# Patient Record
Sex: Male | Born: 1955 | Race: White | Hispanic: Yes | Smoking: Former smoker
Health system: Southern US, Community
[De-identification: ages and names within clinical notes are randomized; demographics above are authoritative.]

## PROBLEM LIST (undated history)

## (undated) DIAGNOSIS — M1990 Unspecified osteoarthritis, unspecified site: Secondary | ICD-10-CM

## (undated) DIAGNOSIS — H9319 Tinnitus, unspecified ear: Secondary | ICD-10-CM

## (undated) DIAGNOSIS — J439 Emphysema, unspecified: Secondary | ICD-10-CM

## (undated) DIAGNOSIS — I10 Essential (primary) hypertension: Secondary | ICD-10-CM

## (undated) DIAGNOSIS — E785 Hyperlipidemia, unspecified: Secondary | ICD-10-CM

## (undated) DIAGNOSIS — G629 Polyneuropathy, unspecified: Secondary | ICD-10-CM

## (undated) DIAGNOSIS — I209 Angina pectoris, unspecified: Secondary | ICD-10-CM

## (undated) DIAGNOSIS — K219 Gastro-esophageal reflux disease without esophagitis: Secondary | ICD-10-CM

## (undated) HISTORY — DX: Polyneuropathy, unspecified: G62.9

## (undated) HISTORY — PX: TONSILLECTOMY: SUR1361

## (undated) HISTORY — PX: VASECTOMY: SHX75

## (undated) HISTORY — DX: Tinnitus, unspecified ear: H93.19

## (undated) HISTORY — DX: Gastro-esophageal reflux disease without esophagitis: K21.9

## (undated) HISTORY — DX: Essential (primary) hypertension: I10

## (undated) HISTORY — DX: Emphysema, unspecified: J43.9

## (undated) HISTORY — DX: Hyperlipidemia, unspecified: E78.5

---

## 1989-08-02 HISTORY — PX: ELBOW SURGERY: SHX618

## 2009-08-02 HISTORY — PX: FEMORAL-POPLITEAL BYPASS GRAFT: SHX937

## 2012-10-05 DIAGNOSIS — I10 Essential (primary) hypertension: Secondary | ICD-10-CM | POA: Insufficient documentation

## 2012-10-05 DIAGNOSIS — I739 Peripheral vascular disease, unspecified: Secondary | ICD-10-CM | POA: Insufficient documentation

## 2012-10-05 DIAGNOSIS — E785 Hyperlipidemia, unspecified: Secondary | ICD-10-CM | POA: Insufficient documentation

## 2017-03-31 ENCOUNTER — Encounter: Payer: Self-pay | Admitting: Physician Assistant

## 2017-03-31 ENCOUNTER — Ambulatory Visit: Payer: Self-pay | Admitting: Physician Assistant

## 2017-03-31 VITALS — BP 184/115 | HR 91 | Temp 97.9°F | Ht 71.0 in | Wt 186.0 lb

## 2017-03-31 DIAGNOSIS — Z13220 Encounter for screening for lipoid disorders: Secondary | ICD-10-CM

## 2017-03-31 DIAGNOSIS — I10 Essential (primary) hypertension: Secondary | ICD-10-CM

## 2017-03-31 DIAGNOSIS — J449 Chronic obstructive pulmonary disease, unspecified: Secondary | ICD-10-CM

## 2017-03-31 DIAGNOSIS — Z131 Encounter for screening for diabetes mellitus: Secondary | ICD-10-CM

## 2017-03-31 DIAGNOSIS — F17219 Nicotine dependence, cigarettes, with unspecified nicotine-induced disorders: Secondary | ICD-10-CM

## 2017-03-31 DIAGNOSIS — Z125 Encounter for screening for malignant neoplasm of prostate: Secondary | ICD-10-CM

## 2017-03-31 MED ORDER — CLONIDINE HCL 0.1 MG PO TABS
0.1000 mg | ORAL_TABLET | Freq: Once | ORAL | Status: AC
  Administered 2017-03-31: 0.1 mg via ORAL

## 2017-03-31 MED ORDER — LISINOPRIL 20 MG PO TABS: 20.0000 mg | ORAL_TABLET | Freq: Every day | ORAL | 1 refills | Status: DC

## 2017-03-31 MED ORDER — METOPROLOL TARTRATE 50 MG PO TABS: 50.0000 mg | ORAL_TABLET | Freq: Two times a day (BID) | ORAL | 1 refills | Status: DC

## 2017-03-31 NOTE — Progress Notes (Signed)
BP (!) 208/113 (BP Location: Right Arm, Patient Position: Sitting, Cuff Size: Normal)   Pulse 91   Temp 97.9 F (36.6 C) (Other (Comment))   Ht 5\' 11"  (1.803 m)   Wt 186 lb (84.4 kg)   SpO2 97%   BMI 25.94 kg/m    Subjective:    Patient ID: Daniel Chung Pain, male    DOB: May 01, 1956, 61 y.o.   MRN: [id number]  HPI: Verl Pain is a 61 y.o. male presenting on 03/31/2017 for New Patient (Initial Visit) ("has felt fatigued and had some dizziness in the last few days")   HPI   Chief Complaint  Patient presents with  . New Patient (Initial Visit)    "has felt fatigued and had some dizziness in the last few days"     Pt was on bp med in the past but hasn't been for past 3 1/2- 4 years.  Pt checks his bp at home- running 626-948 systolically.   Pt is feeling well.  He says he has No HA, no vision changes, no CP.   Relevant past medical, surgical, family and social history reviewed and updated as indicated. Interim medical history since our last visit reviewed. Allergies and medications reviewed and updated.  No current outpatient prescriptions on file.   Review of Systems  Constitutional: Positive for diaphoresis and fatigue. Negative for appetite change, chills, fever and unexpected weight change.  HENT: Positive for congestion. Negative for dental problem, drooling, ear pain, facial swelling, hearing loss, mouth sores, sneezing, sore throat, trouble swallowing and voice change.   Eyes: Negative for pain, discharge, redness, itching and visual disturbance.  Respiratory: Positive for cough and shortness of breath. Negative for choking and wheezing.   Cardiovascular: Negative for chest pain, palpitations and leg swelling.  Gastrointestinal: Negative for abdominal pain, blood in stool, constipation, diarrhea and vomiting.  Endocrine: Negative for cold intolerance, heat intolerance and polydipsia.  Genitourinary: Negative for decreased urine volume, dysuria and hematuria.   Musculoskeletal: Negative for arthralgias, back pain and gait problem.  Skin: Negative for rash.  Allergic/Immunologic: Negative for environmental allergies.  Neurological: Positive for light-headedness. Negative for seizures, syncope and headaches.  Hematological: Negative for adenopathy.  Psychiatric/Behavioral: Negative for agitation, dysphoric mood and suicidal ideas. The patient is nervous/anxious.     Per HPI unless specifically indicated above     Objective:    BP (!) 208/113 (BP Location: Right Arm, Patient Position: Sitting, Cuff Size: Normal)   Pulse 91   Temp 97.9 F (36.6 C) (Other (Comment))   Ht 5\' 11"  (1.803 m)   Wt 186 lb (84.4 kg)   SpO2 97%   BMI 25.94 kg/m   Wt Readings from Last 3 Encounters:  03/31/17 186 lb (84.4 kg)    Physical Exam  Constitutional: He is oriented to person, place, and time. He appears well-developed and well-nourished.  HENT:  Head: Normocephalic and atraumatic.  Mouth/Throat: Oropharynx is clear and moist. No oropharyngeal exudate.  Eyes: Pupils are equal, round, and reactive to light. Conjunctivae and EOM are normal.  Neck: Neck supple. No thyromegaly present.  Cardiovascular: Normal rate and regular rhythm.   Pulmonary/Chest: Effort normal and breath sounds normal. He has no wheezes. He has no rales.  Abdominal: Soft. Bowel sounds are normal. He exhibits no mass. There is no hepatosplenomegaly. There is no tenderness.  Musculoskeletal: He exhibits no edema.  Lymphadenopathy:    He has no cervical adenopathy.  Neurological: He is alert and oriented to person, place, and time.  Skin: Skin is warm and dry. No rash noted.  Psychiatric: He has a normal mood and affect. His behavior is normal. Thought content normal.  Vitals reviewed.      No results found for this or any previous visit.    Assessment & Plan:   Encounter Diagnoses  Name Primary?  . Essential hypertension Yes  . Cigarette nicotine dependence with  nicotine-induced disorder   . Chronic obstructive pulmonary disease, unspecified COPD type ([place])   . Screening cholesterol level   . Screening for diabetes mellitus   . Screening for prostate cancer      -Discussed smoking cessation. chantix worked for him in the past and he tolerated it well.   -will check baseline labs --will get pt signed up fo [facility] -rx albuterol mdi and chantix from [facility] -rx lisinopril and metoprolol for bp. -pt to follow up in 3 weeks.  He is to RTO sooner prn.  Discussed with pt that he should go to ER for HA, CP with eleated bp

## 2017-04-01 ENCOUNTER — Other Ambulatory Visit (HOSPITAL_COMMUNITY)
Admission: RE | Admit: 2017-04-01 | Discharge: 2017-04-01 | Disposition: A | Discharge status: Home / Self Care | Payer: Self-pay | Source: Ambulatory Visit | Attending: Physician Assistant | Admitting: Physician Assistant

## 2017-04-01 DIAGNOSIS — I10 Essential (primary) hypertension: Secondary | ICD-10-CM | POA: Insufficient documentation

## 2017-04-01 DIAGNOSIS — Z13220 Encounter for screening for lipoid disorders: Secondary | ICD-10-CM | POA: Insufficient documentation

## 2017-04-01 DIAGNOSIS — Z131 Encounter for screening for diabetes mellitus: Secondary | ICD-10-CM | POA: Insufficient documentation

## 2017-04-01 DIAGNOSIS — J449 Chronic obstructive pulmonary disease, unspecified: Secondary | ICD-10-CM | POA: Insufficient documentation

## 2017-04-01 DIAGNOSIS — Z125 Encounter for screening for malignant neoplasm of prostate: Secondary | ICD-10-CM | POA: Insufficient documentation

## 2017-04-01 LAB — CBC WITH DIFFERENTIAL/PLATELET
Basophils Absolute: 0 10*3/uL (ref 0.0–0.1)
Basophils Relative: 0 %
EOS ABS: 0.3 10*3/uL (ref 0.0–0.7)
EOS PCT: 2 %
HCT: 49.1 % (ref 39.0–52.0)
Hemoglobin: 17 g/dL (ref 13.0–17.0)
LYMPHS ABS: 3.3 10*3/uL (ref 0.7–4.0)
Lymphocytes Relative: 30 %
MCH: 31.8 pg (ref 26.0–34.0)
MCHC: 34.6 g/dL (ref 30.0–36.0)
MCV: 91.9 fL (ref 78.0–100.0)
Monocytes Absolute: 0.8 10*3/uL (ref 0.1–1.0)
Monocytes Relative: 7 %
Neutro Abs: 6.8 10*3/uL (ref 1.7–7.7)
Neutrophils Relative %: 61 %
PLATELETS: 201 10*3/uL (ref 150–400)
RBC: 5.34 MIL/uL (ref 4.22–5.81)
RDW: 13.7 % (ref 11.5–15.5)
WBC: 11.2 10*3/uL — AB (ref 4.0–10.5)

## 2017-04-01 LAB — HEMOGLOBIN A1C
Hgb A1c MFr Bld: 6 % — ABNORMAL HIGH (ref 4.8–5.6)
Mean Plasma Glucose: 125.5 mg/dL

## 2017-04-01 LAB — LIPID PANEL
CHOLESTEROL: 263 mg/dL — AB (ref 0–200)
HDL: 31 mg/dL — ABNORMAL LOW (ref >40)
LDL Cholesterol: 178 mg/dL — ABNORMAL HIGH (ref 0–99)
TRIGLYCERIDES: 268 mg/dL — AB (ref <150)
Total CHOL/HDL Ratio: 8.5 RATIO
VLDL: 54 mg/dL — AB (ref 0–40)

## 2017-04-01 LAB — PSA: Prostatic Specific Antigen: 1.53 ng/mL (ref 0.00–4.00)

## 2017-04-01 LAB — COMPREHENSIVE METABOLIC PANEL
ALT: 31 U/L (ref 17–63)
ANION GAP: 6 (ref 5–15)
AST: 24 U/L (ref 15–41)
Albumin: 4.2 g/dL (ref 3.5–5.0)
Alkaline Phosphatase: 87 U/L (ref 38–126)
BUN: 16 mg/dL (ref 6–20)
CHLORIDE: 102 mmol/L (ref 101–111)
CO2: 28 mmol/L (ref 22–32)
CREATININE: 1.03 mg/dL (ref 0.61–1.24)
Calcium: 9.8 mg/dL (ref 8.9–10.3)
Glucose, Bld: 119 mg/dL — ABNORMAL HIGH (ref 65–99)
Potassium: 4.6 mmol/L (ref 3.5–5.1)
Sodium: 136 mmol/L (ref 135–145)
Total Bilirubin: 0.5 mg/dL (ref 0.3–1.2)
Total Protein: 7.4 g/dL (ref 6.5–8.1)

## 2017-04-19 ENCOUNTER — Ambulatory Visit: Payer: Self-pay | Admitting: Physician Assistant

## 2017-04-19 ENCOUNTER — Encounter: Payer: Self-pay | Admitting: Physician Assistant

## 2017-04-19 VITALS — BP 175/104 | HR 69 | Temp 97.7°F | Ht 71.0 in | Wt 184.0 lb

## 2017-04-19 DIAGNOSIS — E785 Hyperlipidemia, unspecified: Secondary | ICD-10-CM

## 2017-04-19 DIAGNOSIS — F17219 Nicotine dependence, cigarettes, with unspecified nicotine-induced disorders: Secondary | ICD-10-CM

## 2017-04-19 DIAGNOSIS — I10 Essential (primary) hypertension: Secondary | ICD-10-CM

## 2017-04-19 DIAGNOSIS — J449 Chronic obstructive pulmonary disease, unspecified: Secondary | ICD-10-CM

## 2017-04-19 MED ORDER — METOPROLOL TARTRATE 100 MG PO TABS: 100.0000 mg | ORAL_TABLET | Freq: Two times a day (BID) | ORAL | 1 refills | Status: DC

## 2017-04-19 MED ORDER — ALBUTEROL SULFATE HFA 108 (90 BASE) MCG/ACT IN AERS: 2.0000 | INHALATION_SPRAY | Freq: Four times a day (QID) | RESPIRATORY_TRACT | 2 refills | Status: DC | PRN

## 2017-04-19 MED ORDER — ATORVASTATIN CALCIUM 20 MG PO TABS: 20.0000 mg | ORAL_TABLET | Freq: Every day | ORAL | 2 refills | Status: DC

## 2017-04-19 NOTE — Patient Instructions (Signed)
Fat and Cholesterol Restricted Diet High levels of fat and cholesterol in your blood may lead to various health problems, such as diseases of the heart, blood vessels, gallbladder, liver, and pancreas. Fats are concentrated sources of energy that come in various forms. Certain types of fat, including saturated fat, may be harmful in excess. Cholesterol is a substance needed by your body in small amounts. Your body makes all the cholesterol it needs. Excess cholesterol comes from the food you eat. When you have high levels of cholesterol and saturated fat in your blood, health problems can develop because the excess fat and cholesterol will gather along the walls of your blood vessels, causing them to narrow. Choosing the right foods will help you control your intake of fat and cholesterol. This will help keep the levels of these substances in your blood within normal limits and reduce your risk of disease. What is my plan? Your health care provider recommends that you:  Limit your fat intake to ______% or less of your total calories per day.  Limit the amount of cholesterol in your diet to less than _________mg per day.  Eat 20-30 grams of fiber each day.  What types of fat should I choose?  Choose healthy fats more often. Choose monounsaturated and polyunsaturated fats, such as olive and canola oil, flaxseeds, walnuts, almonds, and seeds.  Eat more omega-3 fats. Good choices include salmon, mackerel, sardines, tuna, flaxseed oil, and ground flaxseeds. Aim to eat fish at least two times a week.  Limit saturated fats. Saturated fats are primarily found in animal products, such as meats, butter, and cream. Plant sources of saturated fats include palm oil, palm kernel oil, and coconut oil.  Avoid foods with partially hydrogenated oils in them. These contain trans fats. Examples of foods that contain trans fats are stick margarine, some tub margarines, cookies, crackers, and other baked goods. What  general guidelines do I need to follow? These guidelines for healthy eating will help you control your intake of fat and cholesterol:  Check food labels carefully to identify foods with trans fats or high amounts of saturated fat.  Fill one half of your plate with vegetables and green salads.  Fill one fourth of your plate with whole grains. Look for the word "whole" as the first word in the ingredient list.  Fill one fourth of your plate with lean protein foods.  Limit fruit to two servings a day. Choose fruit instead of juice.  Eat more foods that contain fiber, such as apples, broccoli, carrots, beans, peas, and barley.  Eat more home-cooked food and less restaurant, buffet, and fast food.  Limit or avoid alcohol.  Limit foods high in starch and sugar.  Limit fried foods.  Cook foods using methods other than frying. Baking, boiling, grilling, and broiling are all great options.  Lose weight if you are overweight. Losing just 5-10% of your initial body weight can help your overall health and prevent diseases such as diabetes and heart disease.  What foods can I eat? Grains  Whole grains, such as whole wheat or whole grain breads, crackers, cereals, and pasta. Unsweetened oatmeal, bulgur, barley, quinoa, or brown rice. Corn or whole wheat flour tortillas. Vegetables  Fresh or frozen vegetables (raw, steamed, roasted, or grilled). Green salads. Fruits  All fresh, canned (in natural juice), or frozen fruits. Meats and other protein foods  Ground beef (85% or leaner), grass-fed beef, or beef trimmed of fat. Skinless chicken or turkey. Ground chicken or turkey.   Pork trimmed of fat. All fish and seafood. Eggs. Dried beans, peas, or lentils. Unsalted nuts or seeds. Unsalted canned or dry beans. Dairy  Low-fat dairy products, such as skim or 1% milk, 2% or reduced-fat cheeses, low-fat ricotta or cottage cheese, or plain low-fat yo Fats and oils  Tub margarines without trans  fats. Light or reduced-fat mayonnaise and salad dressings. Avocado. Olive, canola, sesame, or safflower oils. Natural peanut or almond butter (choose ones without added sugar and oil). The items listed above may not be a complete list of recommended foods or beverages. Contact your dietitian for more options. Foods to avoid Grains  White bread. White pasta. White rice. Cornbread. Bagels, pastries, and croissants. Crackers that contain trans fat. Vegetables  White potatoes. Corn. Creamed or fried vegetables. Vegetables in a cheese sauce. Fruits  Dried fruits. Canned fruit in light or heavy syrup. Fruit juice. Meats and other protein foods  Fatty cuts of meat. Ribs, chicken wings, bacon, sausage, bologna, salami, chitterlings, fatback, hot dogs, bratwurst, and packaged luncheon meats. Liver and organ meats. Dairy  Whole or 2% milk, cream, half-and-half, and cream cheese. Whole milk cheeses. Whole-fat or sweetened yogurt. Full-fat cheeses. Nondairy creamers and whipped toppings. Processed cheese, cheese spreads, or cheese curds. Beverages  Alcohol. Sweetened drinks (such as sodas, lemonade, and fruit drinks or punches). Fats and oils  Butter, stick margarine, lard, shortening, ghee, or bacon fat. Coconut, palm kernel, or palm oils. Sweets and desserts  Corn syrup, sugars, honey, and molasses. Candy. Jam and jelly. Syrup. Sweetened cereals. Cookies, pies, cakes, donuts, muffins, and ice cream. The items listed above may not be a complete list of foods and beverages to avoid. Contact your dietitian for more information. This information is not intended to replace advice given to you by your health care provider. Make sure you discuss any questions you have with your health care provider. Document Released: 07/19/2005 Document Revised: 08/09/2014 Document Reviewed: 10/17/2013 Elsevier Interactive Patient Education  2017 [employer]  

## 2017-04-19 NOTE — Progress Notes (Signed)
BP (!) 175/104 (BP Location: Right Arm, Patient Position: Sitting, Cuff Size: Normal)   Pulse 69   Temp 97.7 F (36.5 C) (Other (Comment))   Ht 5\' 11"  (1.803 m)   Wt 184 lb (83.5 kg)   SpO2 98%   BMI 25.66 kg/m    Subjective:    Patient ID: Daniel Chung, male    DOB: 08-24-1955, 61 y.o.   MRN: [id number]  HPI: Daniel Chung is a 61 y.o. male presenting on 04/19/2017 for Hypertension and Follow-up   HPI   Pt is feeling well today  Relevant past medical, surgical, family and social history reviewed and updated as indicated. Interim medical history since our last visit reviewed. Allergies and medications reviewed and updated.  CURRENT MEDS: Lisinopril 20mg  qd Metoprolol 50mg  bid  Review of Systems  Constitutional: Negative for appetite change, chills, diaphoresis, fatigue, fever and unexpected weight change.  HENT: Negative for congestion, dental problem, drooling, ear pain, facial swelling, hearing loss, mouth sores, sneezing, sore throat, trouble swallowing and voice change.   Eyes: Negative for pain, discharge, redness, itching and visual disturbance.  Respiratory: Negative for cough, choking, shortness of breath and wheezing.   Cardiovascular: Negative for chest pain, palpitations and leg swelling.  Gastrointestinal: Negative for abdominal pain, blood in stool, constipation, diarrhea and vomiting.  Endocrine: Negative for cold intolerance, heat intolerance and polydipsia.  Genitourinary: Negative for decreased urine volume, dysuria and hematuria.  Musculoskeletal: Negative for arthralgias, back pain and gait problem.  Skin: Negative for rash.  Allergic/Immunologic: Negative for environmental allergies.  Neurological: Negative for seizures, syncope, light-headedness and headaches.  Hematological: Negative for adenopathy.  Psychiatric/Behavioral: Negative for agitation, dysphoric mood and suicidal ideas. The patient is not nervous/anxious.     Per HPI unless specifically  indicated above     Objective:    BP (!) 175/104 (BP Location: Right Arm, Patient Position: Sitting, Cuff Size: Normal)   Pulse 69   Temp 97.7 F (36.5 C) (Other (Comment))   Ht 5\' 11"  (1.803 m)   Wt 184 lb (83.5 kg)   SpO2 98%   BMI 25.66 kg/m   Wt Readings from Last 3 Encounters:  04/19/17 184 lb (83.5 kg)  03/31/17 186 lb (84.4 kg)    Physical Exam  Constitutional: He is oriented to person, place, and time. He appears well-developed and well-nourished.  HENT:  Head: Normocephalic and atraumatic.  Neck: Neck supple.  Cardiovascular: Normal rate and regular rhythm.   Pulmonary/Chest: Effort normal and breath sounds normal. He has no wheezes.  Abdominal: Soft. Bowel sounds are normal. There is no hepatosplenomegaly. There is no tenderness.  Musculoskeletal: He exhibits no edema.  Lymphadenopathy:    He has no cervical adenopathy.  Neurological: He is alert and oriented to person, place, and time.  Skin: Skin is warm and dry.  Psychiatric: He has a normal mood and affect. His behavior is normal.  Vitals reviewed.   Results for orders placed or performed during the hospital encounter of 04/01/17  CBC w/Diff/Platelet  Result Value Ref Range   WBC 11.2 (H) 4.0 - 10.5 K/uL   RBC 5.34 4.22 - 5.81 MIL/uL   Hemoglobin 17.0 13.0 - 17.0 g/dL   HCT 49.1 39.0 - 52.0 %   MCV 91.9 78.0 - 100.0 fL   MCH 31.8 26.0 - 34.0 pg   MCHC 34.6 30.0 - 36.0 g/dL   RDW 13.7 11.5 - 15.5 %   Platelets 201 150 - 400 K/uL   Neutrophils Relative %  61 %   Neutro Abs 6.8 1.7 - 7.7 K/uL   Lymphocytes Relative 30 %   Lymphs Abs 3.3 0.7 - 4.0 K/uL   Monocytes Relative 7 %   Monocytes Absolute 0.8 0.1 - 1.0 K/uL   Eosinophils Relative 2 %   Eosinophils Absolute 0.3 0.0 - 0.7 K/uL   Basophils Relative 0 %   Basophils Absolute 0.0 0.0 - 0.1 K/uL  Comprehensive Metabolic Panel (CMET)  Result Value Ref Range   Sodium 136 135 - 145 mmol/L   Potassium 4.6 3.5 - 5.1 mmol/L   Chloride 102 101 - 111  mmol/L   CO2 28 22 - 32 mmol/L   Glucose, Bld 119 (H) 65 - 99 mg/dL   BUN 16 6 - 20 mg/dL   Creatinine, Ser 1.03 0.61 - 1.24 mg/dL   Calcium 9.8 8.9 - 10.3 mg/dL   Total Protein 7.4 6.5 - 8.1 g/dL   Albumin 4.2 3.5 - 5.0 g/dL   AST 24 15 - 41 U/L   ALT 31 17 - 63 U/L   Alkaline Phosphatase 87 38 - 126 U/L   Total Bilirubin 0.5 0.3 - 1.2 mg/dL   GFR calc non Af Amer >60 >60 mL/min   GFR calc Af Amer >60 >60 mL/min   Anion gap 6 5 - 15  Lipid Profile  Result Value Ref Range   Cholesterol 263 (H) 0 - 200 mg/dL   Triglycerides 268 (H) <150 mg/dL   HDL 31 (L) >40 mg/dL   Total CHOL/HDL Ratio 8.5 RATIO   VLDL 54 (H) 0 - 40 mg/dL   LDL Cholesterol 178 (H) 0 - 99 mg/dL  PSA  Result Value Ref Range   Prostatic Specific Antigen 1.53 0.00 - 4.00 ng/mL  HgB A1c  Result Value Ref Range   Hgb A1c MFr Bld 6.0 (H) 4.8 - 5.6 %   Mean Plasma Glucose 125.5 mg/dL      Assessment & Plan:   Encounter Diagnoses  Name Primary?  . Essential hypertension Yes  . Cigarette nicotine dependence with nicotine-induced disorder   . Chronic obstructive pulmonary disease, unspecified COPD type ([place])   . Hyperlipidemia, unspecified hyperlipidemia type     -reviewed labs with pt -Pt to continue lisinopril 20mg  qd.  Will Increase metoprolol to 100mg  bid. -will Start lipitor for cholesterol.  Pt counseled on lowfat diet -will sent lipitor rx to [facility] with rx for albuterol and chantix -pt counseled on smoking cessation.  Reminded pt of proper way to take the chantix when it arrives -pt to follow up 1 month to recheck BP.  He is to RTO sooner prn

## 2017-05-17 ENCOUNTER — Encounter: Payer: Self-pay | Admitting: Physician Assistant

## 2017-05-17 ENCOUNTER — Ambulatory Visit: Payer: Self-pay | Admitting: Physician Assistant

## 2017-05-17 VITALS — BP 140/76 | HR 71 | Temp 97.7°F | Ht 71.0 in | Wt 184.0 lb

## 2017-05-17 DIAGNOSIS — F17219 Nicotine dependence, cigarettes, with unspecified nicotine-induced disorders: Secondary | ICD-10-CM

## 2017-05-17 DIAGNOSIS — R7303 Prediabetes: Secondary | ICD-10-CM

## 2017-05-17 DIAGNOSIS — E785 Hyperlipidemia, unspecified: Secondary | ICD-10-CM

## 2017-05-17 DIAGNOSIS — J449 Chronic obstructive pulmonary disease, unspecified: Secondary | ICD-10-CM | POA: Insufficient documentation

## 2017-05-17 DIAGNOSIS — E1169 Type 2 diabetes mellitus with other specified complication: Secondary | ICD-10-CM | POA: Insufficient documentation

## 2017-05-17 DIAGNOSIS — I10 Essential (primary) hypertension: Secondary | ICD-10-CM

## 2017-05-17 MED ORDER — LISINOPRIL 20 MG PO TABS: 20.0000 mg | ORAL_TABLET | Freq: Every day | ORAL | 2 refills | Status: DC

## 2017-05-17 NOTE — Progress Notes (Signed)
BP 140/76 (BP Location: Left Arm, Patient Position: Sitting, Cuff Size: Normal)   Pulse 71   Temp 97.7 F (36.5 C)   Ht 5\' 11"  (1.803 m)   Wt 184 lb (83.5 kg)   SpO2 97%   BMI 25.66 kg/m    Subjective:    Patient ID: Daniel Chung, male    DOB: [birth year], 61 y.o.   MRN: [id number]  HPI: Daniel Chung is a 61 y.o. male presenting on 05/17/2017 for Hypertension   HPI   Pt is feeling good today.  He has been checking his bp at home and says it was down to 110 one day last week.   He has only used his inhaler a few times  Relevant past medical, surgical, family and social history reviewed and updated as indicated. Interim medical history since our last visit reviewed. Allergies and medications reviewed and updated.   Current Outpatient Prescriptions:  .  albuterol (PROVENTIL HFA;VENTOLIN HFA) 108 (90 Base) MCG/ACT inhaler, Inhale 2 puffs into the lungs every 6 (six) hours as needed for wheezing or shortness of breath., Disp: 3 Inhaler, Rfl: 2 .  atorvastatin (LIPITOR) 20 MG tablet, Take 1 tablet (20 mg total) by mouth daily., Disp: 90 tablet, Rfl: 2 .  lisinopril (PRINIVIL,ZESTRIL) 20 MG tablet, Take 1 tablet (20 mg total) by mouth daily., Disp: 30 tablet, Rfl: 1 .  metoprolol tartrate (LOPRESSOR) 100 MG tablet, Take 1 tablet (100 mg total) by mouth 2 (two) times daily., Disp: 60 tablet, Rfl: 1   Review of Systems  Constitutional: Negative for appetite change, chills, diaphoresis, fatigue, fever and unexpected weight change.  HENT: Negative for congestion, dental problem, drooling, ear pain, facial swelling, hearing loss, mouth sores, sneezing, sore throat, trouble swallowing and voice change.   Eyes: Negative for pain, discharge, redness, itching and visual disturbance.  Respiratory: Negative for cough, choking, shortness of breath and wheezing.   Cardiovascular: Negative for chest pain, palpitations and leg swelling.  Gastrointestinal: Negative for abdominal pain, blood in  stool, constipation, diarrhea and vomiting.  Endocrine: Negative for cold intolerance, heat intolerance and polydipsia.  Genitourinary: Negative for decreased urine volume, dysuria and hematuria.  Musculoskeletal: Negative for arthralgias, back pain and gait problem.  Skin: Negative for rash.  Allergic/Immunologic: Negative for environmental allergies.  Neurological: Negative for seizures, syncope, light-headedness and headaches.  Hematological: Negative for adenopathy.  Psychiatric/Behavioral: Negative for agitation, dysphoric mood and suicidal ideas. The patient is not nervous/anxious.     Per HPI unless specifically indicated above     Objective:    BP 140/76 (BP Location: Left Arm, Patient Position: Sitting, Cuff Size: Normal)   Pulse 71   Temp 97.7 F (36.5 C)   Ht 5\' 11"  (1.803 m)   Wt 184 lb (83.5 kg)   SpO2 97%   BMI 25.66 kg/m   Wt Readings from Last 3 Encounters:  05/17/17 184 lb (83.5 kg)  04/19/17 184 lb (83.5 kg)  03/31/17 186 lb (84.4 kg)    Physical Exam  Constitutional: He is oriented to person, place, and time. He appears well-developed and well-nourished.  HENT:  Head: Normocephalic and atraumatic.  Neck: Neck supple.  Cardiovascular: Normal rate and regular rhythm.   Pulmonary/Chest: Effort normal and breath sounds normal. He has no wheezes.  Abdominal: Soft. Bowel sounds are normal. There is no hepatosplenomegaly. There is no tenderness.  Musculoskeletal: He exhibits no edema.  Lymphadenopathy:    He has no cervical adenopathy.  Neurological: He is alert and oriented  to person, place, and time.  Skin: Skin is warm and dry.  Psychiatric: He has a normal mood and affect. His behavior is normal.  Vitals reviewed.       Assessment & Plan:    Encounter Diagnoses  Name Primary?  . Essential hypertension Yes  . Hyperlipidemia, unspecified hyperlipidemia type   . Chronic obstructive pulmonary disease, unspecified COPD type ([place])   . Prediabetes    . Cigarette nicotine dependence with nicotine-induced disorder     -bp is still a bit high today but significantly improved.  No changes today -pt to follow up in 2 months to recheck bp and cholesterol.  RTO sooner

## 2017-06-14 ENCOUNTER — Other Ambulatory Visit: Payer: Self-pay | Admitting: Physician Assistant

## 2017-07-27 ENCOUNTER — Other Ambulatory Visit (HOSPITAL_COMMUNITY)
Admission: RE | Admit: 2017-07-27 | Discharge: 2017-07-27 | Disposition: A | Discharge status: Home / Self Care | Payer: Self-pay | Source: Ambulatory Visit | Attending: Physician Assistant | Admitting: Physician Assistant

## 2017-07-27 DIAGNOSIS — I10 Essential (primary) hypertension: Secondary | ICD-10-CM | POA: Insufficient documentation

## 2017-07-27 DIAGNOSIS — E785 Hyperlipidemia, unspecified: Secondary | ICD-10-CM | POA: Insufficient documentation

## 2017-07-27 LAB — COMPREHENSIVE METABOLIC PANEL
ALBUMIN: 4.1 g/dL (ref 3.5–5.0)
ALK PHOS: 97 U/L (ref 38–126)
ALT: 20 U/L (ref 17–63)
ANION GAP: 11 (ref 5–15)
AST: 19 U/L (ref 15–41)
BUN: 14 mg/dL (ref 6–20)
CALCIUM: 9.5 mg/dL (ref 8.9–10.3)
CO2: 24 mmol/L (ref 22–32)
Chloride: 101 mmol/L (ref 101–111)
Creatinine, Ser: 0.97 mg/dL (ref 0.61–1.24)
GFR calc non Af Amer: >60 mL/min (ref >60)
GLUCOSE: 118 mg/dL — AB (ref 65–99)
POTASSIUM: 4.4 mmol/L (ref 3.5–5.1)
SODIUM: 136 mmol/L (ref 135–145)
Total Bilirubin: 0.5 mg/dL (ref 0.3–1.2)
Total Protein: 7.4 g/dL (ref 6.5–8.1)

## 2017-07-27 LAB — LIPID PANEL
CHOL/HDL RATIO: 5.9 ratio
Cholesterol: 176 mg/dL (ref 0–200)
HDL: 30 mg/dL — AB (ref >40)
LDL CALC: 105 mg/dL — AB (ref 0–99)
TRIGLYCERIDES: 206 mg/dL — AB (ref <150)
VLDL: 41 mg/dL — ABNORMAL HIGH (ref 0–40)

## 2017-08-03 ENCOUNTER — Encounter: Payer: Self-pay | Admitting: Physician Assistant

## 2017-08-03 ENCOUNTER — Ambulatory Visit: Payer: Self-pay | Admitting: Physician Assistant

## 2017-08-03 VITALS — BP 154/92 | HR 70 | Temp 97.5°F | Ht 71.0 in | Wt 186.0 lb

## 2017-08-03 DIAGNOSIS — F17219 Nicotine dependence, cigarettes, with unspecified nicotine-induced disorders: Secondary | ICD-10-CM

## 2017-08-03 DIAGNOSIS — I10 Essential (primary) hypertension: Secondary | ICD-10-CM

## 2017-08-03 DIAGNOSIS — E785 Hyperlipidemia, unspecified: Secondary | ICD-10-CM

## 2017-08-03 DIAGNOSIS — J449 Chronic obstructive pulmonary disease, unspecified: Secondary | ICD-10-CM

## 2017-08-03 DIAGNOSIS — K219 Gastro-esophageal reflux disease without esophagitis: Secondary | ICD-10-CM

## 2017-08-03 NOTE — Patient Instructions (Signed)
Gastroesophageal Reflux Disease, Adult Normally, food travels down the esophagus and stays in the stomach to be digested. However, when a person has gastroesophageal reflux disease (GERD), food and stomach acid move back up into the esophagus. When this happens, the esophagus becomes sore and inflamed. Over time, GERD can create small holes (ulcers) in the lining of the esophagus. What are the causes? This condition is caused by a problem with the muscle between the esophagus and the stomach (lower esophageal sphincter, or LES). Normally, the LES muscle closes after food passes through the esophagus to the stomach. When the LES is weakened or abnormal, it does not close properly, and that allows food and stomach acid to go back up into the esophagus. The LES can be weakened by certain dietary substances, medicines, and medical conditions, including:  Tobacco use.  Pregnancy.  Having a hiatal hernia.  Heavy alcohol use.  Certain foods and beverages, such as coffee, chocolate, onions, and peppermint.  What increases the risk? This condition is more likely to develop in:  People who have an increased body weight.  People who have connective tissue disorders.  People who use NSAID medicines.  What are the signs or symptoms? Symptoms of this condition include:  Heartburn.  Difficult or painful swallowing.  The feeling of having a lump in the throat.  Abitter taste in the mouth.  Bad breath.  Having a large amount of saliva.  Having an upset or bloated stomach.  Belching.  Chest pain.  Shortness of breath or wheezing.  Ongoing (chronic) cough or a night-time cough.  Wearing away of tooth enamel.  Weight loss.  Different conditions can cause chest pain. Make sure to see your health care provider if you experience chest pain. How is this diagnosed? Your health care provider will take a medical history and perform a physical exam. To determine if you have mild or severe  GERD, your health care provider may also monitor how you respond to treatment. You may also have other tests, including:  An endoscopy toexamine your stomach and esophagus with a small camera.  A test thatmeasures the acidity level in your esophagus.  A test thatmeasures how much pressure is on your esophagus.  A barium swallow or modified barium swallow to show the shape, size, and functioning of your esophagus.  How is this treated? The goal of treatment is to help relieve your symptoms and to prevent complications. Treatment for this condition may vary depending on how severe your symptoms are. Your health care provider may recommend:  Changes to your diet.  Medicine.  Surgery.  Follow these instructions at home: Diet  Follow a diet as recommended by your health care provider. This may involve avoiding foods and drinks such as: ? Coffee and tea (with or without caffeine). ? Drinks that containalcohol. ? Energy drinks and sports drinks. ? Carbonated drinks or sodas. ? Chocolate and cocoa. ? Peppermint and mint flavorings. ? Garlic and onions. ? Horseradish. ? Spicy and acidic foods, including peppers, chili powder, curry powder, vinegar, hot sauces, and barbecue sauce. ? Citrus fruit juices and citrus fruits, such as oranges, lemons, and limes. ? Tomato-based foods, such as red sauce, chili, salsa, and pizza with red sauce. ? Fried and fatty foods, such as donuts, french fries, potato chips, and high-fat dressings. ? High-fat meats, such as hot dogs and fatty cuts of red and white meats, such as rib eye steak, sausage, ham, and bacon. ? High-fat dairy items, such as whole milk,   butter, and cream cheese.  Eat small, frequent meals instead of large meals.  Avoid drinking large amounts of liquid with your meals.  Avoid eating meals during the 2-3 hours before bedtime.  Avoid lying down right after you eat.  Do not exercise right after you eat. General  instructions  Pay attention to any changes in your symptoms.  Take over-the-counter and prescription medicines only as told by your health care provider. Do not take aspirin, ibuprofen, or other NSAIDs unless your health care provider told you to do so.  Do not use any tobacco products, including cigarettes, chewing tobacco, and e-cigarettes. If you need help quitting, ask your health care provider.  Wear loose-fitting clothing. Do not wear anything tight around your waist that causes pressure on your abdomen.  Raise (elevate) the head of your bed 6 inches (15cm).  Try to reduce your stress, such as with yoga or meditation. If you need help reducing stress, ask your health care provider.  If you are overweight, reduce your weight to an amount that is healthy for you. Ask your health care provider for guidance about a safe weight loss goal.  Keep all follow-up visits as told by your health care provider. This is important. Contact a health care provider if:  You have new symptoms.  You have unexplained weight loss.  You have difficulty swallowing, or it hurts to swallow.  You have wheezing or a persistent cough.  Your symptoms do not improve with treatment.  You have a hoarse voice. Get help right away if:  You have pain in your arms, neck, jaw, teeth, or back.  You feel sweaty, dizzy, or light-headed.  You have chest pain or shortness of breath.  You vomit and your vomit looks like blood or coffee grounds.  You faint.  Your stool is bloody or black.  You cannot swallow, drink, or eat. This information is not intended to replace advice given to you by your health care provider. Make sure you discuss any questions you have with your health care provider. Document Released: 04/28/2005 Document Revised: 12/17/2015 Document Reviewed: 11/13/2014 Elsevier Interactive Patient Education  2018 [employer] Inc[employer]  

## 2017-08-03 NOTE — Progress Notes (Signed)
BP (!) 160/86 (BP Location: Left Arm, Patient Position: Sitting, Cuff Size: Normal)   Pulse 70   Temp (!) 97.5 F (36.4 C)   Ht 5\' 11"  (1.803 m)   Wt 186 lb (84.4 kg)   SpO2 97%   BMI 25.94 kg/m    Subjective:    Patient ID: Daniel Chung, male    DOB: November 30, 1955, 62 y.o.   MRN: [id number]  HPI: Daniel Chung is a 62 y.o. male presenting on 08/03/2017 for Hypertension; Hyperlipidemia; and COPD   HPI   Chief Complaint  Patient presents with  . Hypertension  . Hyperlipidemia  . COPD  . Heartburn    pt states having indegestion for a little over a month. pt states has been taking OTC anacids. pt states if it is due to meds he is taking.     Pt is feeling good.   Pt has been checking bp at home.  He says it has been as low as 024 systolic and as high as 097.  Pt noticed heartburn the other night after eating pizza.  States heartburn usually 1 - 1 1/2 hours after eating.  He says it goes away with OTC tums.    Relevant past medical, surgical, family and social history reviewed and updated as indicated. Interim medical history since our last visit reviewed. Allergies and medications reviewed and updated.   Current Outpatient Medications:  .  albuterol (PROVENTIL HFA;VENTOLIN HFA) 108 (90 Base) MCG/ACT inhaler, Inhale 2 puffs into the lungs every 6 (six) hours as needed for wheezing or shortness of breath., Disp: 3 Inhaler, Rfl: 2 .  atorvastatin (LIPITOR) 20 MG tablet, Take 1 tablet (20 mg total) by mouth daily., Disp: 90 tablet, Rfl: 2 .  lisinopril (PRINIVIL,ZESTRIL) 20 MG tablet, Take 1 tablet (20 mg total) by mouth daily., Disp: 30 tablet, Rfl: 2 .  metoprolol tartrate (LOPRESSOR) 100 MG tablet, TAKE 1 TABLET BY MOUTH TWICE DAILY, Disp: 60 tablet, Rfl: 1   Review of Systems  Constitutional: Negative for appetite change, chills, diaphoresis, fatigue, fever and unexpected weight change.  HENT: Negative for congestion, dental problem, drooling, ear pain, facial swelling,  hearing loss, mouth sores, sneezing, sore throat, trouble swallowing and voice change.   Eyes: Negative for pain, discharge, redness, itching and visual disturbance.  Respiratory: Negative for cough, choking, shortness of breath and wheezing.   Cardiovascular: Negative for chest pain, palpitations and leg swelling.  Gastrointestinal: Negative for abdominal pain, blood in stool, constipation, diarrhea and vomiting.  Endocrine: Negative for cold intolerance, heat intolerance and polydipsia.  Genitourinary: Negative for decreased urine volume, dysuria and hematuria.  Musculoskeletal: Negative for arthralgias, back pain and gait problem.  Skin: Negative for rash.  Allergic/Immunologic: Negative for environmental allergies.  Neurological: Negative for seizures, syncope, light-headedness and headaches.  Hematological: Negative for adenopathy.  Psychiatric/Behavioral: Negative for agitation, dysphoric mood and suicidal ideas. The patient is not nervous/anxious.     Per HPI unless specifically indicated above     Objective:    BP (!) 160/86 (BP Location: Left Arm, Patient Position: Sitting, Cuff Size: Normal)   Pulse 70   Temp (!) 97.5 F (36.4 C)   Ht 5\' 11"  (1.803 m)   Wt 186 lb (84.4 kg)   SpO2 97%   BMI 25.94 kg/m   Wt Readings from Last 3 Encounters:  08/03/17 186 lb (84.4 kg)  05/17/17 184 lb (83.5 kg)  04/19/17 184 lb (83.5 kg)    Physical Exam  Constitutional: He is  oriented to person, place, and time. He appears well-developed and well-nourished.  HENT:  Head: Normocephalic and atraumatic.  Neck: Neck supple.  Cardiovascular: Normal rate and regular rhythm.  Pulmonary/Chest: Effort normal and breath sounds normal. He has no wheezes.  Abdominal: Soft. Bowel sounds are normal. There is no hepatosplenomegaly. There is no tenderness.  Musculoskeletal: He exhibits no edema.  Lymphadenopathy:    He has no cervical adenopathy.  Neurological: He is alert and oriented to person,  place, and time.  Skin: Skin is warm and dry.  Psychiatric: He has a normal mood and affect. His behavior is normal.  Vitals reviewed.   Results for orders placed or performed during the hospital encounter of 07/27/17  Comprehensive metabolic panel  Result Value Ref Range   Sodium 136 135 - 145 mmol/L   Potassium 4.4 3.5 - 5.1 mmol/L   Chloride 101 101 - 111 mmol/L   CO2 24 22 - 32 mmol/L   Glucose, Bld 118 (H) 65 - 99 mg/dL   BUN 14 6 - 20 mg/dL   Creatinine, Ser 0.97 0.61 - 1.24 mg/dL   Calcium 9.5 8.9 - 10.3 mg/dL   Total Protein 7.4 6.5 - 8.1 g/dL   Albumin 4.1 3.5 - 5.0 g/dL   AST 19 15 - 41 U/L   ALT 20 17 - 63 U/L   Alkaline Phosphatase 97 38 - 126 U/L   Total Bilirubin 0.5 0.3 - 1.2 mg/dL   GFR calc non Af Amer >60 >60 mL/min   GFR calc Af Amer >60 >60 mL/min   Anion gap 11 5 - 15  Lipid panel  Result Value Ref Range   Cholesterol 176 0 - 200 mg/dL   Triglycerides 206 (H) <150 mg/dL   HDL 30 (L) >40 mg/dL   Total CHOL/HDL Ratio 5.9 RATIO   VLDL 41 (H) 0 - 40 mg/dL   LDL Cholesterol 105 (H) 0 - 99 mg/dL      Assessment & Plan:    Encounter Diagnoses  Name Primary?  . Essential hypertension Yes  . Hyperlipidemia, unspecified hyperlipidemia type   . Cigarette nicotine dependence with nicotine-induced disorder   . Gastroesophageal reflux disease, esophagitis presence not specified   . Chronic obstructive pulmonary disease, unspecified COPD type ([place])     -reviewed labs with pt -counseled pt on GERD and smoking cessation -pt can use OTC tums or ranitidine prn -pt to continue current medications.  Discussed with pt that bp is high today but he needs to continue to monitor at home.   -follow up 3 months.  RTO sooner prn

## 2017-08-17 ENCOUNTER — Other Ambulatory Visit: Payer: Self-pay | Admitting: Physician Assistant

## 2017-10-24 ENCOUNTER — Other Ambulatory Visit (HOSPITAL_COMMUNITY)
Admission: RE | Admit: 2017-10-24 | Discharge: 2017-10-24 | Disposition: A | Discharge status: Home / Self Care | Payer: Self-pay | Source: Ambulatory Visit | Attending: Physician Assistant | Admitting: Physician Assistant

## 2017-10-24 DIAGNOSIS — E785 Hyperlipidemia, unspecified: Secondary | ICD-10-CM | POA: Insufficient documentation

## 2017-10-24 DIAGNOSIS — I10 Essential (primary) hypertension: Secondary | ICD-10-CM | POA: Insufficient documentation

## 2017-10-24 LAB — COMPREHENSIVE METABOLIC PANEL
ALBUMIN: 4 g/dL (ref 3.5–5.0)
ALT: 22 U/L (ref 17–63)
ANION GAP: 9 (ref 5–15)
AST: 19 U/L (ref 15–41)
Alkaline Phosphatase: 112 U/L (ref 38–126)
BILIRUBIN TOTAL: 0.6 mg/dL (ref 0.3–1.2)
BUN: 17 mg/dL (ref 6–20)
CO2: 25 mmol/L (ref 22–32)
Calcium: 9.4 mg/dL (ref 8.9–10.3)
Chloride: 101 mmol/L (ref 101–111)
Creatinine, Ser: 1.12 mg/dL (ref 0.61–1.24)
GFR calc Af Amer: >60 mL/min (ref >60)
GFR calc non Af Amer: >60 mL/min (ref >60)
GLUCOSE: 118 mg/dL — AB (ref 65–99)
POTASSIUM: 4.5 mmol/L (ref 3.5–5.1)
Sodium: 135 mmol/L (ref 135–145)
TOTAL PROTEIN: 7.4 g/dL (ref 6.5–8.1)

## 2017-10-24 LAB — LIPID PANEL
CHOL/HDL RATIO: 7.2 ratio
CHOLESTEROL: 181 mg/dL (ref 0–200)
HDL: 25 mg/dL — ABNORMAL LOW (ref >40)
LDL Cholesterol: 97 mg/dL (ref 0–99)
Triglycerides: 294 mg/dL — ABNORMAL HIGH (ref <150)
VLDL: 59 mg/dL — AB (ref 0–40)

## 2017-11-01 ENCOUNTER — Ambulatory Visit: Payer: Self-pay | Admitting: Physician Assistant

## 2017-11-01 ENCOUNTER — Encounter: Payer: Self-pay | Admitting: Physician Assistant

## 2017-11-01 ENCOUNTER — Other Ambulatory Visit: Payer: Self-pay | Admitting: Physician Assistant

## 2017-11-01 VITALS — BP 160/108 | HR 75 | Temp 97.5°F | Ht 71.0 in | Wt 193.8 lb

## 2017-11-01 DIAGNOSIS — I10 Essential (primary) hypertension: Secondary | ICD-10-CM

## 2017-11-01 DIAGNOSIS — Z1211 Encounter for screening for malignant neoplasm of colon: Secondary | ICD-10-CM

## 2017-11-01 DIAGNOSIS — M25552 Pain in left hip: Secondary | ICD-10-CM

## 2017-11-01 DIAGNOSIS — E785 Hyperlipidemia, unspecified: Secondary | ICD-10-CM

## 2017-11-01 DIAGNOSIS — J449 Chronic obstructive pulmonary disease, unspecified: Secondary | ICD-10-CM

## 2017-11-01 DIAGNOSIS — F17219 Nicotine dependence, cigarettes, with unspecified nicotine-induced disorders: Secondary | ICD-10-CM

## 2017-11-01 MED ORDER — ATORVASTATIN CALCIUM 20 MG PO TABS: 20.0000 mg | ORAL_TABLET | Freq: Every day | ORAL | 2 refills | Status: DC

## 2017-11-01 MED ORDER — AMLODIPINE BESYLATE 5 MG PO TABS: 5.0000 mg | ORAL_TABLET | Freq: Every day | ORAL | 1 refills | Status: DC

## 2017-11-01 MED ORDER — METOPROLOL TARTRATE 100 MG PO TABS: 100.0000 mg | ORAL_TABLET | Freq: Two times a day (BID) | ORAL | 1 refills | Status: DC

## 2017-11-01 MED ORDER — LISINOPRIL 20 MG PO TABS: 20.0000 mg | ORAL_TABLET | Freq: Every day | ORAL | 2 refills | Status: DC

## 2017-11-01 NOTE — Patient Instructions (Signed)
Fat and Cholesterol Restricted Diet High levels of fat and cholesterol in your blood may lead to various health problems, such as diseases of the heart, blood vessels, gallbladder, liver, and pancreas. Fats are concentrated sources of energy that come in various forms. Certain types of fat, including saturated fat, may be harmful in excess. Cholesterol is a substance needed by your body in small amounts. Your body makes all the cholesterol it needs. Excess cholesterol comes from the food you eat. When you have high levels of cholesterol and saturated fat in your blood, health problems can develop because the excess fat and cholesterol will gather along the walls of your blood vessels, causing them to narrow. Choosing the right foods will help you control your intake of fat and cholesterol. This will help keep the levels of these substances in your blood within normal limits and reduce your risk of disease. What is my plan? Your health care provider recommends that you:  Limit your fat intake to ______% or less of your total calories per day.  Limit the amount of cholesterol in your diet to less than _________mg per day.  Eat 20-30 grams of fiber each day.  What types of fat should I choose?  Choose healthy fats more often. Choose monounsaturated and polyunsaturated fats, such as olive and canola oil, flaxseeds, walnuts, almonds, and seeds.  Eat more omega-3 fats. Good choices include salmon, mackerel, sardines, tuna, flaxseed oil, and ground flaxseeds. Aim to eat fish at least two times a week.  Limit saturated fats. Saturated fats are primarily found in animal products, such as meats, butter, and cream. Plant sources of saturated fats include palm oil, palm kernel oil, and coconut oil.  Avoid foods with partially hydrogenated oils in them. These contain trans fats. Examples of foods that contain trans fats are stick margarine, some tub margarines, cookies, crackers, and other baked goods. What  general guidelines do I need to follow? These guidelines for healthy eating will help you control your intake of fat and cholesterol:  Check food labels carefully to identify foods with trans fats or high amounts of saturated fat.  Fill one half of your plate with vegetables and green salads.  Fill one fourth of your plate with whole grains. Look for the word "whole" as the first word in the ingredient list.  Fill one fourth of your plate with lean protein foods.  Limit fruit to two servings a day. Choose fruit instead of juice.  Eat more foods that contain fiber, such as apples, broccoli, carrots, beans, peas, and barley.  Eat more home-cooked food and less restaurant, buffet, and fast food.  Limit or avoid alcohol.  Limit foods high in starch and sugar.  Limit fried foods.  Cook foods using methods other than frying. Baking, boiling, grilling, and broiling are all great options.  Lose weight if you are overweight. Losing just 5-10% of your initial body weight can help your overall health and prevent diseases such as diabetes and heart disease.  What foods can I eat? Grains  Whole grains, such as whole wheat or whole grain breads, crackers, cereals, and pasta. Unsweetened oatmeal, bulgur, barley, quinoa, or brown rice. Corn or whole wheat flour tortillas. Vegetables  Fresh or frozen vegetables (raw, steamed, roasted, or grilled). Green salads. Fruits  All fresh, canned (in natural juice), or frozen fruits. Meats and other protein foods  Ground beef (85% or leaner), grass-fed beef, or beef trimmed of fat. Skinless chicken or turkey. Ground chicken or turkey.   Pork trimmed of fat. All fish and seafood. Eggs. Dried beans, peas, or lentils. Unsalted nuts or seeds. Unsalted canned or dry beans. Dairy  Low-fat dairy products, such as skim or 1% milk, 2% or reduced-fat cheeses, low-fat ricotta or cottage cheese, or plain low-fat yo Fats and oils  Tub margarines without trans  fats. Light or reduced-fat mayonnaise and salad dressings. Avocado. Olive, canola, sesame, or safflower oils. Natural peanut or almond butter (choose ones without added sugar and oil). The items listed above may not be a complete list of recommended foods or beverages. Contact your dietitian for more options. Foods to avoid Grains  White bread. White pasta. White rice. Cornbread. Bagels, pastries, and croissants. Crackers that contain trans fat. Vegetables  White potatoes. Corn. Creamed or fried vegetables. Vegetables in a cheese sauce. Fruits  Dried fruits. Canned fruit in light or heavy syrup. Fruit juice. Meats and other protein foods  Fatty cuts of meat. Ribs, chicken wings, bacon, sausage, bologna, salami, chitterlings, fatback, hot dogs, bratwurst, and packaged luncheon meats. Liver and organ meats. Dairy  Whole or 2% milk, cream, half-and-half, and cream cheese. Whole milk cheeses. Whole-fat or sweetened yogurt. Full-fat cheeses. Nondairy creamers and whipped toppings. Processed cheese, cheese spreads, or cheese curds. Beverages  Alcohol. Sweetened drinks (such as sodas, lemonade, and fruit drinks or punches). Fats and oils  Butter, stick margarine, lard, shortening, ghee, or bacon fat. Coconut, palm kernel, or palm oils. Sweets and desserts  Corn syrup, sugars, honey, and molasses. Candy. Jam and jelly. Syrup. Sweetened cereals. Cookies, pies, cakes, donuts, muffins, and ice cream. The items listed above may not be a complete list of foods and beverages to avoid. Contact your dietitian for more information. This information is not intended to replace advice given to you by your health care provider. Make sure you discuss any questions you have with your health care provider. Document Released: 07/19/2005 Document Revised: 08/09/2014 Document Reviewed: 10/17/2013 Elsevier Interactive Patient Education  2018 [employer] Inc[employer]  

## 2017-11-01 NOTE — Progress Notes (Signed)
BP (!) 160/108 (BP Location: Left Arm, Patient Position: Sitting, Cuff Size: Normal)   Pulse 75   Temp (!) 97.5 F (36.4 C)   Ht 5\' 11"  (1.803 m)   Wt 193 lb 12 oz (87.9 kg)   SpO2 97%   BMI 27.02 kg/m    Subjective:    Patient ID: Daniel Chung, male    DOB: 09-Mar-1956, 62 y.o.   MRN: [id number]  HPI: Daniel Chung is a 62 y.o. male presenting on 11/01/2017 for Hypertension and Hyperlipidemia   HPI  Pt has not been checking his bp at home because his roommate that had the machine moved out.  Pt is still smoking.  He says his Breathing is okay.    Pt states L hip hurts when he is walking.  He says it is so bad that he has to stop walking for a while and then he can go on.  That has been going on for several months.   Pain is not in the calf, just the hip.   Relevant past medical, surgical, family and social history reviewed and updated as indicated. Interim medical history since our last visit reviewed. Allergies and medications reviewed and updated.   Current Outpatient Medications:  .  albuterol (PROVENTIL HFA;VENTOLIN HFA) 108 (90 Base) MCG/ACT inhaler, Inhale 2 puffs into the lungs every 6 (six) hours as needed for wheezing or shortness of breath., Disp: 3 Inhaler, Rfl: 2 .  atorvastatin (LIPITOR) 20 MG tablet, Take 1 tablet (20 mg total) by mouth daily., Disp: 90 tablet, Rfl: 2 .  lisinopril (PRINIVIL,ZESTRIL) 20 MG tablet, TAKE 1 TABLET BY MOUTH ONCE DAILY, Disp: 30 tablet, Rfl: 4 .  metoprolol tartrate (LOPRESSOR) 100 MG tablet, TAKE 1 TABLET BY MOUTH TWICE DAILY, Disp: 60 tablet, Rfl: 4  Review of Systems  Constitutional: Negative for appetite change, chills, diaphoresis, fatigue, fever and unexpected weight change.  HENT: Negative for congestion, dental problem, drooling, ear pain, facial swelling, hearing loss, mouth sores, sneezing, sore throat, trouble swallowing and voice change.   Eyes: Negative for pain, discharge, redness, itching and visual disturbance.   Respiratory: Negative for cough, choking, shortness of breath and wheezing.   Cardiovascular: Negative for chest pain, palpitations and leg swelling.  Gastrointestinal: Negative for abdominal pain, blood in stool, constipation, diarrhea and vomiting.  Endocrine: Negative for cold intolerance, heat intolerance and polydipsia.  Genitourinary: Negative for decreased urine volume, dysuria and hematuria.  Musculoskeletal: Positive for arthralgias. Negative for back pain and gait problem.  Skin: Negative for rash.  Allergic/Immunologic: Positive for environmental allergies.  Neurological: Negative for seizures, syncope, light-headedness and headaches.  Hematological: Negative for adenopathy.  Psychiatric/Behavioral: Negative for agitation, dysphoric mood and suicidal ideas. The patient is not nervous/anxious.     Per HPI unless specifically indicated above     Objective:    BP (!) 160/108 (BP Location: Left Arm, Patient Position: Sitting, Cuff Size: Normal)   Pulse 75   Temp (!) 97.5 F (36.4 C)   Ht 5\' 11"  (1.803 m)   Wt 193 lb 12 oz (87.9 kg)   SpO2 97%   BMI 27.02 kg/m   Wt Readings from Last 3 Encounters:  11/01/17 193 lb 12 oz (87.9 kg)  08/03/17 186 lb (84.4 kg)  05/17/17 184 lb (83.5 kg)    Physical Exam  Constitutional: He is oriented to person, place, and time. He appears well-developed and well-nourished.  HENT:  Head: Normocephalic and atraumatic.  Neck: Neck supple.  Cardiovascular: Normal rate,  regular rhythm and intact distal pulses.  Pulmonary/Chest: Effort normal and breath sounds normal. He has no wheezes.  Abdominal: Soft. Bowel sounds are normal. There is no hepatosplenomegaly. There is no tenderness.  Musculoskeletal: He exhibits no edema.       Left hip: He exhibits normal range of motion, normal strength, no tenderness and no bony tenderness.  Lymphadenopathy:    He has no cervical adenopathy.  Neurological: He is alert and oriented to person, place, and  time.  Skin: Skin is warm and dry.  Psychiatric: He has a normal mood and affect. His behavior is normal.  Vitals reviewed.   Results for orders placed or performed during the hospital encounter of 10/24/17  Lipid panel  Result Value Ref Range   Cholesterol 181 0 - 200 mg/dL   Triglycerides 294 (H) <150 mg/dL   HDL 25 (L) >40 mg/dL   Total CHOL/HDL Ratio 7.2 RATIO   VLDL 59 (H) 0 - 40 mg/dL   LDL Cholesterol 97 0 - 99 mg/dL  Comprehensive metabolic panel  Result Value Ref Range   Sodium 135 135 - 145 mmol/L   Potassium 4.5 3.5 - 5.1 mmol/L   Chloride 101 101 - 111 mmol/L   CO2 25 22 - 32 mmol/L   Glucose, Bld 118 (H) 65 - 99 mg/dL   BUN 17 6 - 20 mg/dL   Creatinine, Ser 1.12 0.61 - 1.24 mg/dL   Calcium 9.4 8.9 - 10.3 mg/dL   Total Protein 7.4 6.5 - 8.1 g/dL   Albumin 4.0 3.5 - 5.0 g/dL   AST 19 15 - 41 U/L   ALT 22 17 - 63 U/L   Alkaline Phosphatase 112 38 - 126 U/L   Total Bilirubin 0.6 0.3 - 1.2 mg/dL   GFR calc non Af Amer >60 >60 mL/min   GFR calc Af Amer >60 >60 mL/min   Anion gap 9 5 - 15      Assessment & Plan:   Encounter Diagnoses  Name Primary?  . Essential hypertension Yes  . Hyperlipidemia, unspecified hyperlipidemia type   . Left hip pain   . Cigarette nicotine dependence with nicotine-induced disorder   . Chronic obstructive pulmonary disease, unspecified COPD type ([place])   . Screening for colon cancer     -reviewed labs with pt -Add fish oil 1 daily.  Continue current medications.   -counseled pt to watch lowfat diet -Add amlodipine 5 mg qd.   -X ray hip -pt [facility] -pt says Most recent colonoscopy done in [place] and he has no idea where and does not have that information anywhere at home.   Will give pt iFOBT for colon cancer screening -counseled smoking cessation -pt to follow up 1 month to recheck blood pressure and discuss hip xray.  RTO sooner prn

## 2017-11-03 ENCOUNTER — Ambulatory Visit (HOSPITAL_COMMUNITY)
Admission: RE | Admit: 2017-11-03 | Discharge: 2017-11-03 | Disposition: A | Discharge status: Home / Self Care | Payer: Self-pay | Source: Ambulatory Visit | Attending: Physician Assistant | Admitting: Physician Assistant

## 2017-11-03 DIAGNOSIS — M25552 Pain in left hip: Secondary | ICD-10-CM | POA: Insufficient documentation

## 2017-12-01 ENCOUNTER — Encounter: Payer: Self-pay | Admitting: Physician Assistant

## 2017-12-01 ENCOUNTER — Ambulatory Visit: Payer: Self-pay | Admitting: Physician Assistant

## 2017-12-01 VITALS — BP 114/66 | HR 75 | Temp 97.7°F | Ht 71.0 in | Wt 194.8 lb

## 2017-12-01 DIAGNOSIS — R7303 Prediabetes: Secondary | ICD-10-CM

## 2017-12-01 DIAGNOSIS — J449 Chronic obstructive pulmonary disease, unspecified: Secondary | ICD-10-CM

## 2017-12-01 DIAGNOSIS — I10 Essential (primary) hypertension: Secondary | ICD-10-CM

## 2017-12-01 DIAGNOSIS — F17219 Nicotine dependence, cigarettes, with unspecified nicotine-induced disorders: Secondary | ICD-10-CM

## 2017-12-01 DIAGNOSIS — E785 Hyperlipidemia, unspecified: Secondary | ICD-10-CM

## 2017-12-01 DIAGNOSIS — M25552 Pain in left hip: Secondary | ICD-10-CM

## 2017-12-01 MED ORDER — AMLODIPINE BESYLATE 5 MG PO TABS: 5.0000 mg | ORAL_TABLET | Freq: Every day | ORAL | 1 refills | Status: DC

## 2017-12-01 NOTE — Progress Notes (Signed)
BP 114/66 (BP Location: Left Arm, Patient Position: Sitting, Cuff Size: Normal)   Pulse 75   Temp 97.7 F (36.5 C)   Ht 5\' 11"  (1.803 m)   Wt 194 lb 12 oz (88.3 kg)   SpO2 95%   BMI 27.16 kg/m    Subjective:    Patient ID: Daniel Chung, male    DOB: 22-Mar-1956, 62 y.o.   MRN: [id number]  HPI: [name] is a 62 y.o. male presenting on 12/01/2017 for Hypertension   HPI   Pt states after he gets hip pain form perisistent walking.  Short distances cause no pain.  Reviewed results of recent hip xray with pt  Pt is excited that his BP has been doing good. He checks it at home  He is still smoking.     Relevant past medical, surgical, family and social history reviewed and updated as indicated. Interim medical history since our last visit reviewed. Allergies and medications reviewed and updated.   Current Outpatient Medications:  .  albuterol (PROVENTIL HFA;VENTOLIN HFA) 108 (90 Base) MCG/ACT inhaler, Inhale 2 puffs into the lungs every 6 (six) hours as needed for wheezing or shortness of breath., Disp: 3 Inhaler, Rfl: 2 .  amLODipine (NORVASC) 5 MG tablet, Take 1 tablet (5 mg total) by mouth daily., Disp: 30 tablet, Rfl: 1 .  aspirin 81 MG chewable tablet, Chew 81 mg by mouth daily., Disp: , Rfl:  .  atorvastatin (LIPITOR) 20 MG tablet, Take 1 tablet (20 mg total) by mouth daily., Disp: 90 tablet, Rfl: 2 .  lisinopril (PRINIVIL,ZESTRIL) 20 MG tablet, Take 1 tablet (20 mg total) by mouth daily., Disp: 90 tablet, Rfl: 2 .  metoprolol tartrate (LOPRESSOR) 100 MG tablet, Take 1 tablet (100 mg total) by mouth 2 (two) times daily., Disp: 180 tablet, Rfl: 1 .  Omega-3 Fatty Acids (FISH OIL) 1000 MG CAPS, Take 1 capsule by mouth daily., Disp: , Rfl:    Review of Systems  Constitutional: Negative for appetite change, chills, diaphoresis, fatigue, fever and unexpected weight change.  HENT: Positive for sneezing. Negative for congestion, dental problem, drooling, ear pain, facial  swelling, hearing loss, mouth sores, sore throat, trouble swallowing and voice change.   Eyes: Negative for pain, discharge, redness, itching and visual disturbance.  Respiratory: Negative for cough, choking, shortness of breath and wheezing.   Cardiovascular: Negative for chest pain, palpitations and leg swelling.  Gastrointestinal: Negative for abdominal pain, blood in stool, constipation, diarrhea and vomiting.  Endocrine: Negative for cold intolerance, heat intolerance and polydipsia.  Genitourinary: Negative for decreased urine volume, dysuria and hematuria.  Musculoskeletal: Negative for arthralgias, back pain and gait problem.  Skin: Negative for rash.  Allergic/Immunologic: Positive for environmental allergies.  Neurological: Negative for seizures, syncope, light-headedness and headaches.  Hematological: Negative for adenopathy.  Psychiatric/Behavioral: Negative for agitation, dysphoric mood and suicidal ideas. The patient is not nervous/anxious.     Per HPI unless specifically indicated above     Objective:    BP 114/66 (BP Location: Left Arm, Patient Position: Sitting, Cuff Size: Normal)   Pulse 75   Temp 97.7 F (36.5 C)   Ht 5\' 11"  (1.803 m)   Wt 194 lb 12 oz (88.3 kg)   SpO2 95%   BMI 27.16 kg/m   Wt Readings from Last 3 Encounters:  12/01/17 194 lb 12 oz (88.3 kg)  11/01/17 193 lb 12 oz (87.9 kg)  08/03/17 186 lb (84.4 kg)    Physical Exam  Constitutional: He  is oriented to person, place, and time. He appears well-developed and well-nourished.  HENT:  Head: Normocephalic and atraumatic.  Neck: Neck supple.  Cardiovascular: Normal rate and regular rhythm.  Pulmonary/Chest: Effort normal and breath sounds normal. He has no wheezes.  Abdominal: Soft. Bowel sounds are normal. There is no hepatosplenomegaly. There is no tenderness.  Musculoskeletal: He exhibits no edema.  Lymphadenopathy:    He has no cervical adenopathy.  Neurological: He is alert and oriented  to person, place, and time.  Skin: Skin is warm and dry.  Psychiatric: He has a normal mood and affect. His behavior is normal.  Vitals reviewed.       Assessment & Plan:   Encounter Diagnoses  Name Primary?  . Essential hypertension Yes  . Left hip pain   . Cigarette nicotine dependence with nicotine-induced disorder   . Chronic obstructive pulmonary disease, unspecified COPD type ([place])   . Hyperlipidemia, unspecified hyperlipidemia type   . Prediabetes      -BP improved.  Pt to continue current medications -recommended pt add calcium with vit d to medications to help bone density -counseled on smoking cessation.  Also discussed that smoking is bad for the bones -discussed no treatment at preset for his hip issues. Encouraged him to exercise regularly when he can -discussed with pt that his iFOBT was unreadable (due to too much stool).  He declined to repeat test at this time -pt to follow up 2 months.  RTO sooner prn

## 2018-01-23 ENCOUNTER — Other Ambulatory Visit (HOSPITAL_COMMUNITY)
Admission: RE | Admit: 2018-01-23 | Discharge: 2018-01-23 | Disposition: A | Discharge status: Home / Self Care | Payer: Self-pay | Source: Ambulatory Visit | Attending: Physician Assistant | Admitting: Physician Assistant

## 2018-01-23 DIAGNOSIS — R7303 Prediabetes: Secondary | ICD-10-CM | POA: Insufficient documentation

## 2018-01-23 DIAGNOSIS — E785 Hyperlipidemia, unspecified: Secondary | ICD-10-CM | POA: Insufficient documentation

## 2018-01-23 DIAGNOSIS — I10 Essential (primary) hypertension: Secondary | ICD-10-CM | POA: Insufficient documentation

## 2018-01-23 LAB — HEMOGLOBIN A1C
Hgb A1c MFr Bld: 6.3 % — ABNORMAL HIGH (ref 4.8–5.6)
MEAN PLASMA GLUCOSE: 134.11 mg/dL

## 2018-01-23 LAB — COMPREHENSIVE METABOLIC PANEL
ALK PHOS: 94 U/L (ref 38–126)
ALT: 21 U/L (ref 17–63)
ANION GAP: 8 (ref 5–15)
AST: 18 U/L (ref 15–41)
Albumin: 3.8 g/dL (ref 3.5–5.0)
BILIRUBIN TOTAL: 0.6 mg/dL (ref 0.3–1.2)
BUN: 18 mg/dL (ref 6–20)
CALCIUM: 9.1 mg/dL (ref 8.9–10.3)
CO2: 21 mmol/L — ABNORMAL LOW (ref 22–32)
Chloride: 106 mmol/L (ref 101–111)
Creatinine, Ser: 0.84 mg/dL (ref 0.61–1.24)
GLUCOSE: 128 mg/dL — AB (ref 65–99)
Potassium: 4.2 mmol/L (ref 3.5–5.1)
Sodium: 135 mmol/L (ref 135–145)
TOTAL PROTEIN: 7 g/dL (ref 6.5–8.1)

## 2018-01-23 LAB — LIPID PANEL
CHOLESTEROL: 157 mg/dL (ref 0–200)
HDL: 21 mg/dL — ABNORMAL LOW (ref >40)
LDL Cholesterol: 94 mg/dL (ref 0–99)
Total CHOL/HDL Ratio: 7.5 RATIO
Triglycerides: 209 mg/dL — ABNORMAL HIGH (ref <150)
VLDL: 42 mg/dL — AB (ref 0–40)

## 2018-01-31 ENCOUNTER — Ambulatory Visit: Payer: Self-pay | Admitting: Physician Assistant

## 2018-01-31 ENCOUNTER — Encounter: Payer: Self-pay | Admitting: Physician Assistant

## 2018-01-31 VITALS — BP 154/92 | HR 68 | Temp 97.5°F | Ht 71.0 in | Wt 191.0 lb

## 2018-01-31 DIAGNOSIS — J449 Chronic obstructive pulmonary disease, unspecified: Secondary | ICD-10-CM

## 2018-01-31 DIAGNOSIS — E785 Hyperlipidemia, unspecified: Secondary | ICD-10-CM

## 2018-01-31 DIAGNOSIS — R7303 Prediabetes: Secondary | ICD-10-CM

## 2018-01-31 DIAGNOSIS — F17219 Nicotine dependence, cigarettes, with unspecified nicotine-induced disorders: Secondary | ICD-10-CM

## 2018-01-31 DIAGNOSIS — I10 Essential (primary) hypertension: Secondary | ICD-10-CM

## 2018-01-31 DIAGNOSIS — Z125 Encounter for screening for malignant neoplasm of prostate: Secondary | ICD-10-CM

## 2018-01-31 MED ORDER — METOPROLOL TARTRATE 100 MG PO TABS: 100.0000 mg | ORAL_TABLET | Freq: Two times a day (BID) | ORAL | 1 refills | Status: DC

## 2018-01-31 MED ORDER — LISINOPRIL 20 MG PO TABS: 20.0000 mg | ORAL_TABLET | Freq: Every day | ORAL | 2 refills | Status: DC

## 2018-01-31 MED ORDER — ATORVASTATIN CALCIUM 20 MG PO TABS: 20.0000 mg | ORAL_TABLET | Freq: Every day | ORAL | 2 refills | Status: DC

## 2018-01-31 MED ORDER — AMLODIPINE BESYLATE 10 MG PO TABS: 10.0000 mg | ORAL_TABLET | Freq: Every day | ORAL | 2 refills | Status: DC

## 2018-01-31 NOTE — Progress Notes (Signed)
BP 139/88 (BP Location: Right Arm, Patient Position: Sitting, Cuff Size: Normal)   Pulse 68   Temp (!) 97.5 F (36.4 C) (Other (Comment))   Ht 5\' 11"  (1.803 m)   Wt 191 lb (86.6 kg)   SpO2 95%   BMI 26.64 kg/m    Subjective:    Patient ID: Daniel Chung, male    DOB: [birth year], 62 y.o.   MRN: [id number]  HPI: Daniel Chung is a 62 y.o. male presenting on 01/31/2018 for Hyperlipidemia and Hypertension   HPI Pt is doing well today and is without complaint.  He is still smoking.   Pt states BP running high at home- 150-170.    Relevant past medical, surgical, family and social history reviewed and updated as indicated. Interim medical history since our last visit reviewed. Allergies and medications reviewed and updated.   Current Outpatient Medications:  .  albuterol (PROVENTIL HFA;VENTOLIN HFA) 108 (90 Base) MCG/ACT inhaler, Inhale 2 puffs into the lungs every 6 (six) hours as needed for wheezing or shortness of breath., Disp: 3 Inhaler, Rfl: 2 .  amLODipine (NORVASC) 5 MG tablet, Take 1 tablet (5 mg total) by mouth daily., Disp: 90 tablet, Rfl: 1 .  aspirin 81 MG chewable tablet, Chew 81 mg by mouth daily., Disp: , Rfl:  .  atorvastatin (LIPITOR) 20 MG tablet, Take 1 tablet (20 mg total) by mouth daily., Disp: 90 tablet, Rfl: 2 .  calcium carbonate (OSCAL) 1500 (600 Ca) MG TABS tablet, Take 600 mg of elemental calcium by mouth daily., Disp: , Rfl:  .  lisinopril (PRINIVIL,ZESTRIL) 20 MG tablet, Take 1 tablet (20 mg total) by mouth daily., Disp: 90 tablet, Rfl: 2 .  metoprolol tartrate (LOPRESSOR) 100 MG tablet, Take 1 tablet (100 mg total) by mouth 2 (two) times daily., Disp: 180 tablet, Rfl: 1 .  Omega-3 Fatty Acids (FISH OIL) 1000 MG CAPS, Take 1 capsule by mouth daily., Disp: , Rfl:    Review of Systems  Constitutional: Negative for appetite change, chills, diaphoresis, fatigue, fever and unexpected weight change.  HENT: Negative for congestion, dental problem, drooling, ear  pain, facial swelling, hearing loss, mouth sores, sneezing, sore throat, trouble swallowing and voice change.   Eyes: Negative for pain, discharge, redness, itching and visual disturbance.  Respiratory: Negative for cough, choking, shortness of breath and wheezing.   Cardiovascular: Negative for chest pain, palpitations and leg swelling.  Gastrointestinal: Negative for abdominal pain, blood in stool, constipation, diarrhea and vomiting.  Endocrine: Negative for cold intolerance, heat intolerance and polydipsia.  Genitourinary: Negative for decreased urine volume, dysuria and hematuria.  Musculoskeletal: Positive for arthralgias. Negative for back pain and gait problem.  Skin: Negative for rash.  Allergic/Immunologic: Positive for environmental allergies.  Neurological: Negative for seizures, syncope, light-headedness and headaches.  Hematological: Negative for adenopathy.  Psychiatric/Behavioral: Negative for agitation, dysphoric mood and suicidal ideas. The patient is not nervous/anxious.     Per HPI unless specifically indicated above     Objective:    BP 139/88 (BP Location: Right Arm, Patient Position: Sitting, Cuff Size: Normal)   Pulse 68   Temp (!) 97.5 F (36.4 C) (Other (Comment))   Ht 5\' 11"  (1.803 m)   Wt 191 lb (86.6 kg)   SpO2 95%   BMI 26.64 kg/m   Wt Readings from Last 3 Encounters:  01/31/18 191 lb (86.6 kg)  12/01/17 194 lb 12 oz (88.3 kg)  11/01/17 193 lb 12 oz (87.9 kg)    Physical Exam  Constitutional: He is oriented to person, place, and time. He appears well-developed and well-nourished.  HENT:  Head: Normocephalic and atraumatic.  Neck: Neck supple.  Cardiovascular: Normal rate and regular rhythm.  Pulmonary/Chest: Effort normal and breath sounds normal. He has no wheezes.  Abdominal: Soft. Bowel sounds are normal. There is no hepatosplenomegaly. There is no tenderness.  Musculoskeletal: He exhibits no edema.  Lymphadenopathy:    He has no cervical  adenopathy.  Neurological: He is alert and oriented to person, place, and time.  Skin: Skin is warm and dry.  Psychiatric: He has a normal mood and affect. His behavior is normal.  Vitals reviewed.   Results for orders placed or performed during the hospital encounter of 01/23/18  Comprehensive metabolic panel  Result Value Ref Range   Sodium 135 135 - 145 mmol/L   Potassium 4.2 3.5 - 5.1 mmol/L   Chloride 106 101 - 111 mmol/L   CO2 21 (L) 22 - 32 mmol/L   Glucose, Bld 128 (H) 65 - 99 mg/dL   BUN 18 6 - 20 mg/dL   Creatinine, Ser 0.84 0.61 - 1.24 mg/dL   Calcium 9.1 8.9 - 10.3 mg/dL   Total Protein 7.0 6.5 - 8.1 g/dL   Albumin 3.8 3.5 - 5.0 g/dL   AST 18 15 - 41 U/L   ALT 21 17 - 63 U/L   Alkaline Phosphatase 94 38 - 126 U/L   Total Bilirubin 0.6 0.3 - 1.2 mg/dL   GFR calc non Af Amer >60 >60 mL/min   GFR calc Af Amer >60 >60 mL/min   Anion gap 8 5 - 15  Hemoglobin A1c  Result Value Ref Range   Hgb A1c MFr Bld 6.3 (H) 4.8 - 5.6 %   Mean Plasma Glucose 134.11 mg/dL  Lipid panel  Result Value Ref Range   Cholesterol 157 0 - 200 mg/dL   Triglycerides 209 (H) <150 mg/dL   HDL 21 (L) >40 mg/dL   Total CHOL/HDL Ratio 7.5 RATIO   VLDL 42 (H) 0 - 40 mg/dL   LDL Cholesterol 94 0 - 99 mg/dL      Assessment & Plan:   Encounter Diagnoses  Name Primary?  . Essential hypertension Yes  . Hyperlipidemia, unspecified hyperlipidemia type   . Prediabetes   . Chronic obstructive pulmonary disease, unspecified COPD type ([place])   . Cigarette nicotine dependence with nicotine-induced disorder     -reviewed labs wih pt -Increase fish oil to 2 capsules daily.  Continue low fat diet -Increase amlodipine to 10mg  -counseled smoking cessation -Pt to follow up in 3 months.  RTO sooner prn

## 2018-04-21 ENCOUNTER — Other Ambulatory Visit (HOSPITAL_COMMUNITY)
Admission: RE | Admit: 2018-04-21 | Discharge: 2018-04-21 | Disposition: A | Discharge status: Home / Self Care | Payer: Self-pay | Source: Ambulatory Visit | Attending: Physician Assistant | Admitting: Physician Assistant

## 2018-04-21 DIAGNOSIS — I10 Essential (primary) hypertension: Secondary | ICD-10-CM | POA: Insufficient documentation

## 2018-04-21 DIAGNOSIS — E785 Hyperlipidemia, unspecified: Secondary | ICD-10-CM | POA: Insufficient documentation

## 2018-04-21 DIAGNOSIS — Z125 Encounter for screening for malignant neoplasm of prostate: Secondary | ICD-10-CM | POA: Insufficient documentation

## 2018-04-21 LAB — COMPREHENSIVE METABOLIC PANEL
ALBUMIN: 4.1 g/dL (ref 3.5–5.0)
ALT: 29 U/L (ref 0–44)
AST: 23 U/L (ref 15–41)
Alkaline Phosphatase: 86 U/L (ref 38–126)
Anion gap: 8 (ref 5–15)
BUN: 16 mg/dL (ref 8–23)
CHLORIDE: 105 mmol/L (ref 98–111)
CO2: 24 mmol/L (ref 22–32)
Calcium: 9.2 mg/dL (ref 8.9–10.3)
Creatinine, Ser: 0.93 mg/dL (ref 0.61–1.24)
GFR calc Af Amer: >60 mL/min (ref >60)
GFR calc non Af Amer: >60 mL/min (ref >60)
Glucose, Bld: 122 mg/dL — ABNORMAL HIGH (ref 70–99)
POTASSIUM: 4.1 mmol/L (ref 3.5–5.1)
Sodium: 137 mmol/L (ref 135–145)
Total Bilirubin: 0.6 mg/dL (ref 0.3–1.2)
Total Protein: 7.2 g/dL (ref 6.5–8.1)

## 2018-04-21 LAB — LIPID PANEL
CHOL/HDL RATIO: 5.6 ratio
Cholesterol: 174 mg/dL (ref 0–200)
HDL: 31 mg/dL — ABNORMAL LOW (ref >40)
LDL CALC: 99 mg/dL (ref 0–99)
Triglycerides: 222 mg/dL — ABNORMAL HIGH (ref <150)
VLDL: 44 mg/dL — ABNORMAL HIGH (ref 0–40)

## 2018-04-21 LAB — PSA: Prostatic Specific Antigen: 1.75 ng/mL (ref 0.00–4.00)

## 2018-05-03 ENCOUNTER — Ambulatory Visit: Payer: Self-pay | Admitting: Physician Assistant

## 2018-05-03 ENCOUNTER — Encounter: Payer: Self-pay | Admitting: Physician Assistant

## 2018-05-03 VITALS — BP 128/81 | HR 66 | Temp 97.9°F | Wt 197.2 lb

## 2018-05-03 DIAGNOSIS — I10 Essential (primary) hypertension: Secondary | ICD-10-CM

## 2018-05-03 DIAGNOSIS — K219 Gastro-esophageal reflux disease without esophagitis: Secondary | ICD-10-CM

## 2018-05-03 DIAGNOSIS — J449 Chronic obstructive pulmonary disease, unspecified: Secondary | ICD-10-CM

## 2018-05-03 DIAGNOSIS — E785 Hyperlipidemia, unspecified: Secondary | ICD-10-CM

## 2018-05-03 NOTE — Progress Notes (Signed)
BP 128/81 (BP Location: Right Arm, Patient Position: Sitting, Cuff Size: Normal)   Pulse 66   Temp 97.9 F (36.6 C)   Wt 197 lb 4 oz (89.5 kg)   SpO2 95%   BMI 27.51 kg/m    Subjective:    Patient ID: Daniel Chung, male    DOB: [birth year], 62 y.o.   MRN: [id number]  HPI: Daniel Chung is a 62 y.o. male presenting on 05/03/2018 for Hypertension and Hyperlipidemia   HPI   Pt is checking his bp at home and says it runs 100-105/60-65  Pt quit smoking 2 months ago.    Pt is feeling well today and has no complaints.   Relevant past medical, surgical, family and social history reviewed and updated as indicated. Interim medical history since our last visit reviewed. Allergies and medications reviewed and updated.   Current Outpatient Medications:  .  albuterol (PROVENTIL HFA;VENTOLIN HFA) 108 (90 Base) MCG/ACT inhaler, Inhale 2 puffs into the lungs every 6 (six) hours as needed for wheezing or shortness of breath., Disp: 3 Inhaler, Rfl: 2 .  amLODipine (NORVASC) 10 MG tablet, Take 1 tablet (10 mg total) by mouth daily., Disp: 90 tablet, Rfl: 2 .  aspirin 81 MG chewable tablet, Chew 81 mg by mouth daily., Disp: , Rfl:  .  atorvastatin (LIPITOR) 20 MG tablet, Take 1 tablet (20 mg total) by mouth daily., Disp: 90 tablet, Rfl: 2 .  calcium carbonate (OSCAL) 1500 (600 Ca) MG TABS tablet, Take 600 mg of elemental calcium by mouth daily., Disp: , Rfl:  .  Calcium Carbonate Antacid (TUMS CHEWY BITES PO), Take 1 tablet by mouth daily., Disp: , Rfl:  .  lisinopril (PRINIVIL,ZESTRIL) 20 MG tablet, Take 1 tablet (20 mg total) by mouth daily., Disp: 90 tablet, Rfl: 2 .  metoprolol tartrate (LOPRESSOR) 100 MG tablet, Take 1 tablet (100 mg total) by mouth 2 (two) times daily., Disp: 180 tablet, Rfl: 1 .  Omega-3 Fatty Acids (FISH OIL) 1000 MG CAPS, Take 1 capsule by mouth daily. , Disp: , Rfl:  .  ranitidine (ZANTAC) 150 MG tablet, Take 150 mg by mouth daily., Disp: , Rfl:    Review of Systems   Constitutional: Negative for appetite change, chills, diaphoresis, fatigue, fever and unexpected weight change.  HENT: Negative for congestion, dental problem, drooling, ear pain, facial swelling, hearing loss, mouth sores, sneezing, sore throat, trouble swallowing and voice change.   Eyes: Negative for pain, discharge, redness, itching and visual disturbance.  Respiratory: Negative for cough, choking, shortness of breath and wheezing.   Cardiovascular: Negative for chest pain, palpitations and leg swelling.  Gastrointestinal: Negative for abdominal pain, blood in stool, constipation, diarrhea and vomiting.  Endocrine: Negative for cold intolerance, heat intolerance and polydipsia.  Genitourinary: Negative for decreased urine volume, dysuria and hematuria.  Musculoskeletal: Negative for arthralgias, back pain and gait problem.  Skin: Negative for rash.  Allergic/Immunologic: Positive for environmental allergies.  Neurological: Negative for seizures, syncope, light-headedness and headaches.  Hematological: Negative for adenopathy.  Psychiatric/Behavioral: Negative for agitation, dysphoric mood and suicidal ideas. The patient is not nervous/anxious.     Per HPI unless specifically indicated above     Objective:    BP 128/81 (BP Location: Right Arm, Patient Position: Sitting, Cuff Size: Normal)   Pulse 66   Temp 97.9 F (36.6 C)   Wt 197 lb 4 oz (89.5 kg)   SpO2 95%   BMI 27.51 kg/m   Wt Readings from Last 3 Encounters:  05/03/18 197 lb 4 oz (89.5 kg)  01/31/18 191 lb (86.6 kg)  12/01/17 194 lb 12 oz (88.3 kg)    Physical Exam  Constitutional: He is oriented to person, place, and time. He appears well-developed and well-nourished.  HENT:  Head: Normocephalic and atraumatic.  Neck: Neck supple.  Cardiovascular: Normal rate and regular rhythm.  Pulmonary/Chest: Effort normal and breath sounds normal. He has no wheezes.  Abdominal: Soft. Bowel sounds are normal. There is no  hepatosplenomegaly. There is no tenderness.  Musculoskeletal: He exhibits no edema.  Lymphadenopathy:    He has no cervical adenopathy.  Neurological: He is alert and oriented to person, place, and time.  Skin: Skin is warm and dry.  Psychiatric: He has a normal mood and affect. His behavior is normal.  Vitals reviewed.   Results for orders placed or performed during the hospital encounter of 04/21/18  Comprehensive metabolic panel  Result Value Ref Range   Sodium 137 135 - 145 mmol/L   Potassium 4.1 3.5 - 5.1 mmol/L   Chloride 105 98 - 111 mmol/L   CO2 24 22 - 32 mmol/L   Glucose, Bld 122 (H) 70 - 99 mg/dL   BUN 16 8 - 23 mg/dL   Creatinine, Ser 0.93 0.61 - 1.24 mg/dL   Calcium 9.2 8.9 - 10.3 mg/dL   Total Protein 7.2 6.5 - 8.1 g/dL   Albumin 4.1 3.5 - 5.0 g/dL   AST 23 15 - 41 U/L   ALT 29 0 - 44 U/L   Alkaline Phosphatase 86 38 - 126 U/L   Total Bilirubin 0.6 0.3 - 1.2 mg/dL   GFR calc non Af Amer >60 >60 mL/min   GFR calc Af Amer >60 >60 mL/min   Anion gap 8 5 - 15  Lipid panel  Result Value Ref Range   Cholesterol 174 0 - 200 mg/dL   Triglycerides 222 (H) <150 mg/dL   HDL 31 (L) >40 mg/dL   Total CHOL/HDL Ratio 5.6 RATIO   VLDL 44 (H) 0 - 40 mg/dL   LDL Cholesterol 99 0 - 99 mg/dL  PSA  Result Value Ref Range   Prostatic Specific Antigen 1.75 0.00 - 4.00 ng/mL      Assessment & Plan:    Encounter Diagnoses  Name Primary?  . Essential hypertension Yes  . Hyperlipidemia, unspecified hyperlipidemia type   . Chronic obstructive pulmonary disease, unspecified COPD type ([place])   . Gastroesophageal reflux disease, esophagitis presence not specified     -reviewed labs with pt  -congratulated pt on stopping smoking! -Pt reminded to return iFOBT given in April -pt to continue current rx medications -discussed with pt he may want to discontinue ranitidine and take OTC pepcid -pt to follow up 3 months.  RTO sooner prn

## 2018-05-12 ENCOUNTER — Other Ambulatory Visit: Payer: Self-pay | Admitting: Physician Assistant

## 2018-08-03 ENCOUNTER — Other Ambulatory Visit (HOSPITAL_COMMUNITY)
Admission: RE | Admit: 2018-08-03 | Discharge: 2018-08-03 | Disposition: A | Discharge status: Home / Self Care | Payer: Self-pay | Source: Ambulatory Visit | Attending: Physician Assistant | Admitting: Physician Assistant

## 2018-08-03 DIAGNOSIS — I10 Essential (primary) hypertension: Secondary | ICD-10-CM | POA: Insufficient documentation

## 2018-08-03 DIAGNOSIS — E785 Hyperlipidemia, unspecified: Secondary | ICD-10-CM | POA: Insufficient documentation

## 2018-08-03 LAB — COMPREHENSIVE METABOLIC PANEL
ALBUMIN: 3.7 g/dL (ref 3.5–5.0)
ALT: 42 U/L (ref 0–44)
ANION GAP: 7 (ref 5–15)
AST: 24 U/L (ref 15–41)
Alkaline Phosphatase: 77 U/L (ref 38–126)
BUN: 15 mg/dL (ref 8–23)
CO2: 22 mmol/L (ref 22–32)
Calcium: 8.3 mg/dL — ABNORMAL LOW (ref 8.9–10.3)
Chloride: 106 mmol/L (ref 98–111)
Creatinine, Ser: 0.94 mg/dL (ref 0.61–1.24)
GFR calc Af Amer: >60 mL/min (ref >60)
GFR calc non Af Amer: >60 mL/min (ref >60)
GLUCOSE: 112 mg/dL — AB (ref 70–99)
POTASSIUM: 4 mmol/L (ref 3.5–5.1)
SODIUM: 135 mmol/L (ref 135–145)
Total Bilirubin: 0.4 mg/dL (ref 0.3–1.2)
Total Protein: 6.7 g/dL (ref 6.5–8.1)

## 2018-08-03 LAB — LIPID PANEL
CHOL/HDL RATIO: 5.3 ratio
Cholesterol: 96 mg/dL (ref 0–200)
HDL: 18 mg/dL — ABNORMAL LOW (ref >40)
LDL Cholesterol: 42 mg/dL (ref 0–99)
Triglycerides: 179 mg/dL — ABNORMAL HIGH (ref <150)
VLDL: 36 mg/dL (ref 0–40)

## 2018-08-09 ENCOUNTER — Encounter: Payer: Self-pay | Admitting: Physician Assistant

## 2018-08-09 ENCOUNTER — Ambulatory Visit: Payer: Self-pay | Admitting: Physician Assistant

## 2018-08-09 VITALS — BP 140/86 | HR 70 | Temp 97.7°F | Ht 71.0 in | Wt 201.0 lb

## 2018-08-09 DIAGNOSIS — I10 Essential (primary) hypertension: Secondary | ICD-10-CM

## 2018-08-09 DIAGNOSIS — E785 Hyperlipidemia, unspecified: Secondary | ICD-10-CM

## 2018-08-09 DIAGNOSIS — K219 Gastro-esophageal reflux disease without esophagitis: Secondary | ICD-10-CM

## 2018-08-09 NOTE — Progress Notes (Signed)
BP 140/86 (BP Location: Left Arm, Patient Position: Sitting, Cuff Size: Normal)   Pulse 70   Temp 97.7 F (36.5 C) (Other (Comment))   Ht 5\' 11"  (1.803 m)   Wt 201 lb (91.2 kg)   SpO2 98%   BMI 28.03 kg/m    Subjective:    Patient ID: Daniel Chung, male    DOB: [birth year], 63 y.o.   MRN: [id number]  HPI: Daniel Chung is a 63 y.o. male presenting on 08/09/2018 for Hypertension and Hyperlipidemia   HPI   Pt says he checks his bp at home and it usually runs 120/70.  He says it is not elevated.    He is still off cigarettes!  He just passed 5 months.   He says he is feeling well and has no complaints.   Relevant past medical, surgical, family and social history reviewed and updated as indicated. Interim medical history since our last visit reviewed. Allergies and medications reviewed and updated.    Current Outpatient Medications:  .  amLODipine (NORVASC) 10 MG tablet, Take 1 tablet (10 mg total) by mouth daily., Disp: 90 tablet, Rfl: 2 .  aspirin 81 MG chewable tablet, Chew 81 mg by mouth daily., Disp: , Rfl:  .  atorvastatin (LIPITOR) 20 MG tablet, Take 1 tablet (20 mg total) by mouth daily., Disp: 90 tablet, Rfl: 2 .  calcium carbonate (OSCAL) 1500 (600 Ca) MG TABS tablet, Take 600 mg of elemental calcium by mouth daily., Disp: , Rfl:  .  Calcium Carbonate Antacid (TUMS CHEWY BITES PO), Take 1 tablet by mouth daily., Disp: , Rfl:  .  lisinopril (PRINIVIL,ZESTRIL) 20 MG tablet, Take 1 tablet (20 mg total) by mouth daily., Disp: 90 tablet, Rfl: 2 .  metoprolol tartrate (LOPRESSOR) 100 MG tablet, Take 1 tablet (100 mg total) by mouth 2 (two) times daily., Disp: 180 tablet, Rfl: 1 .  Omega-3 Fatty Acids (FISH OIL) 1000 MG CAPS, Take 1 capsule by mouth daily. , Disp: , Rfl:  .  PROVENTIL HFA 108 (90 Base) MCG/ACT inhaler, INHALE 2 PUFFS BY MOUTH EVERY 6 HOURS AS NEEDED FOR WHEEZING OR SHORTNESS OF BREATH, Disp: 20.1 g, Rfl: 2    Review of Systems  Constitutional: Negative for  appetite change, chills, diaphoresis, fatigue, fever and unexpected weight change.  HENT: Negative for congestion, dental problem, drooling, ear pain, facial swelling, hearing loss, mouth sores, sneezing, sore throat, trouble swallowing and voice change.   Eyes: Negative for pain, discharge, redness, itching and visual disturbance.  Respiratory: Negative for cough, choking, shortness of breath and wheezing.   Cardiovascular: Negative for chest pain, palpitations and leg swelling.  Gastrointestinal: Negative for abdominal pain, blood in stool, constipation, diarrhea and vomiting.  Endocrine: Negative for cold intolerance, heat intolerance and polydipsia.  Genitourinary: Negative for decreased urine volume, dysuria and hematuria.  Musculoskeletal: Negative for arthralgias, back pain and gait problem.  Skin: Negative for rash.  Allergic/Immunologic: Positive for environmental allergies.  Neurological: Negative for seizures, syncope, light-headedness and headaches.  Hematological: Negative for adenopathy.  Psychiatric/Behavioral: Negative for agitation, dysphoric mood and suicidal ideas. The patient is not nervous/anxious.     Per HPI unless specifically indicated above     Objective:    BP 140/86 (BP Location: Left Arm, Patient Position: Sitting, Cuff Size: Normal)   Pulse 70   Temp 97.7 F (36.5 C) (Other (Comment))   Ht 5\' 11"  (1.803 m)   Wt 201 lb (91.2 kg)   SpO2 98%   BMI  28.03 kg/m   Wt Readings from Last 3 Encounters:  08/09/18 201 lb (91.2 kg)  05/03/18 197 lb 4 oz (89.5 kg)  01/31/18 191 lb (86.6 kg)    Physical Exam Vitals signs reviewed.  Constitutional:      Appearance: He is well-developed.  HENT:     Head: Normocephalic and atraumatic.  Neck:     Musculoskeletal: Neck supple.  Cardiovascular:     Rate and Rhythm: Normal rate and regular rhythm.  Pulmonary:     Effort: Pulmonary effort is normal.     Breath sounds: Normal breath sounds. No wheezing.   Abdominal:     General: Bowel sounds are normal.     Palpations: Abdomen is soft.     Tenderness: There is no abdominal tenderness.  Lymphadenopathy:     Cervical: No cervical adenopathy.  Skin:    General: Skin is warm and dry.  Neurological:     Mental Status: He is alert and oriented to person, place, and time.  Psychiatric:        Behavior: Behavior normal.     Results for orders placed or performed during the hospital encounter of 08/03/18  Lipid panel  Result Value Ref Range   Cholesterol 96 0 - 200 mg/dL   Triglycerides 179 (H) <150 mg/dL   HDL 18 (L) >40 mg/dL   Total CHOL/HDL Ratio 5.3 RATIO   VLDL 36 0 - 40 mg/dL   LDL Cholesterol 42 0 - 99 mg/dL  Comprehensive metabolic panel  Result Value Ref Range   Sodium 135 135 - 145 mmol/L   Potassium 4.0 3.5 - 5.1 mmol/L   Chloride 106 98 - 111 mmol/L   CO2 22 22 - 32 mmol/L   Glucose, Bld 112 (H) 70 - 99 mg/dL   BUN 15 8 - 23 mg/dL   Creatinine, Ser 0.94 0.61 - 1.24 mg/dL   Calcium 8.3 (L) 8.9 - 10.3 mg/dL   Total Protein 6.7 6.5 - 8.1 g/dL   Albumin 3.7 3.5 - 5.0 g/dL   AST 24 15 - 41 U/L   ALT 42 0 - 44 U/L   Alkaline Phosphatase 77 38 - 126 U/L   Total Bilirubin 0.4 0.3 - 1.2 mg/dL   GFR calc non Af Amer >60 >60 mL/min   GFR calc Af Amer >60 >60 mL/min   Anion gap 7 5 - 15      Assessment & Plan:    Encounter Diagnoses  Name Primary?  . Essential hypertension Yes  . Hyperlipidemia, unspecified hyperlipidemia type   . Gastroesophageal reflux disease, esophagitis presence not specified     -reviewed labs with pt -Pt prefers OTC pepcid to another rx for GERD.  Counseled on food choices and lifestyle changes to help with GERD -no medications changes today.  Pt to continue to monitor bp at home.  He is to RTO if it starts running 140 at home -congratulated pt on staying off cigarettes and encouraged him to keep up the good work -pt to follow up 3 months

## 2018-08-09 NOTE — Patient Instructions (Signed)
Gastroesophageal Reflux Disease, Adult Gastroesophageal reflux (GER) happens when acid from the stomach flows up into the tube that connects the mouth and the stomach (esophagus). Normally, food travels down the esophagus and stays in the stomach to be digested. However, when a person has GER, food and stomach acid sometimes move back up into the esophagus. If this becomes a more serious problem, the person may be diagnosed with a disease called gastroesophageal reflux disease (GERD). GERD occurs when the reflux:  Happens often.  Causes frequent or severe symptoms.  Causes problems such as damage to the esophagus. When stomach acid comes in contact with the esophagus, the acid may cause soreness (inflammation) in the esophagus. Over time, GERD may create small holes (ulcers) in the lining of the esophagus. What are the causes? This condition is caused by a problem with the muscle between the esophagus and the stomach (lower esophageal sphincter, or LES). Normally, the LES muscle closes after food passes through the esophagus to the stomach. When the LES is weakened or abnormal, it does not close properly, and that allows food and stomach acid to go back up into the esophagus. The LES can be weakened by certain dietary substances, medicines, and medical conditions, including:  Tobacco use.  Pregnancy.  Having a hiatal hernia.  Alcohol use.  Certain foods and beverages, such as coffee, chocolate, onions, and peppermint. What increases the risk? You are more likely to develop this condition if you:  Have an increased body weight.  Have a connective tissue disorder.  Use NSAID medicines. What are the signs or symptoms? Symptoms of this condition include:  Heartburn.  Difficult or painful swallowing.  The feeling of having a lump in the throat.  Abitter taste in the mouth.  Bad breath.  Having a large amount of saliva.  Having an upset or bloated  stomach.  Belching.  Chest pain. Different conditions can cause chest pain. Make sure you see your health care provider if you experience chest pain.  Shortness of breath or wheezing.  Ongoing (chronic) cough or a night-time cough.  Wearing away of tooth enamel.  Weight loss. How is this diagnosed? Your health care provider will take a medical history and perform a physical exam. To determine if you have mild or severe GERD, your health care provider may also monitor how you respond to treatment. You may also have tests, including:  A test to examine your stomach and esophagus with a small camera (endoscopy).  A test thatmeasures the acidity level in your esophagus.  A test thatmeasures how much pressure is on your esophagus.  A barium swallow or modified barium swallow test to show the shape, size, and functioning of your esophagus. How is this treated? The goal of treatment is to help relieve your symptoms and to prevent complications. Treatment for this condition may vary depending on how severe your symptoms are. Your health care provider may recommend:  Changes to your diet.  Medicine.  Surgery. Follow these instructions at home: Eating and drinking   Follow a diet as recommended by your health care provider. This may involve avoiding foods and drinks such as: ? Coffee and tea (with or without caffeine). ? Drinks that containalcohol. ? Energy drinks and sports drinks. ? Carbonated drinks or sodas. ? Chocolate and cocoa. ? Peppermint and mint flavorings. ? Garlic and onions. ? Horseradish. ? Spicy and acidic foods, including peppers, chili powder, curry powder, vinegar, hot sauces, and barbecue sauce. ? Citrus fruit juices and citrus   fruits, such as oranges, lemons, and limes. ? Tomato-based foods, such as red sauce, chili, salsa, and pizza with red sauce. ? Fried and fatty foods, such as donuts, french fries, potato chips, and high-fat dressings. ? High-fat  meats, such as hot dogs and fatty cuts of red and white meats, such as rib eye steak, sausage, ham, and bacon. ? High-fat dairy items, such as whole milk, butter, and cream cheese.  Eat small, frequent meals instead of large meals.  Avoid drinking large amounts of liquid with your meals.  Avoid eating meals during the 2-3 hours before bedtime.  Avoid lying down right after you eat.  Do not exercise right after you eat. Lifestyle   Do not use any products that contain nicotine or tobacco, such as cigarettes, e-cigarettes, and chewing tobacco. If you need help quitting, ask your health care provider.  Try to reduce your stress by using methods such as yoga or meditation. If you need help reducing stress, ask your health care provider.  If you are overweight, reduce your weight to an amount that is healthy for you. Ask your health care provider for guidance about a safe weight loss goal. General instructions  Pay attention to any changes in your symptoms.  Take over-the-counter and prescription medicines only as told by your health care provider. Do not take aspirin, ibuprofen, or other NSAIDs unless your health care provider told you to do so.  Wear loose-fitting clothing. Do not wear anything tight around your waist that causes pressure on your abdomen.  Raise (elevate) the head of your bed about 6 inches (15 cm).  Avoid bending over if this makes your symptoms worse.  Keep all follow-up visits as told by your health care provider. This is important. Contact a health care provider if:  You have: ? New symptoms. ? Unexplained weight loss. ? Difficulty swallowing or it hurts to swallow. ? Wheezing or a persistent cough. ? A hoarse voice.  Your symptoms do not improve with treatment. Get help right away if you:  Have pain in your arms, neck, jaw, teeth, or back.  Feel sweaty, dizzy, or light-headed.  Have chest pain or shortness of breath.  Vomit and your vomit looks  like blood or coffee grounds.  Faint.  Have stool that is bloody or black.  Cannot swallow, drink, or eat. Summary  Gastroesophageal reflux happens when acid from the stomach flows up into the esophagus. GERD is a disease in which the reflux happens often, causes frequent or severe symptoms, or causes problems such as damage to the esophagus.  Treatment for this condition may vary depending on how severe your symptoms are. Your health care provider may recommend diet and lifestyle changes, medicine, or surgery.  Contact a health care provider if you have new or worsening symptoms.  Take over-the-counter and prescription medicines only as told by your health care provider. Do not take aspirin, ibuprofen, or other NSAIDs unless your health care provider told you to do so.  Keep all follow-up visits as told by your health care provider. This is important. This information is not intended to replace advice given to you by your health care provider. Make sure you discuss any questions you have with your health care provider. Document Released: 04/28/2005 Document Revised: 01/25/2018 Document Reviewed: 01/25/2018 Elsevier Interactive Patient Education  2019 [employer]  

## 2018-08-28 ENCOUNTER — Other Ambulatory Visit: Payer: Self-pay | Admitting: Physician Assistant

## 2018-10-25 ENCOUNTER — Other Ambulatory Visit: Payer: Self-pay | Admitting: Physician Assistant

## 2018-10-25 MED ORDER — LISINOPRIL 20 MG PO TABS: 20.0000 mg | ORAL_TABLET | Freq: Every day | ORAL | 2 refills | Status: DC

## 2018-10-25 MED ORDER — METOPROLOL TARTRATE 100 MG PO TABS: 100.0000 mg | ORAL_TABLET | Freq: Two times a day (BID) | ORAL | 1 refills | Status: DC

## 2018-10-25 MED ORDER — AMLODIPINE BESYLATE 10 MG PO TABS: 10.0000 mg | ORAL_TABLET | Freq: Every day | ORAL | 2 refills | Status: DC

## 2018-10-25 MED ORDER — ATORVASTATIN CALCIUM 20 MG PO TABS: 20.0000 mg | ORAL_TABLET | Freq: Every day | ORAL | 2 refills | Status: DC

## 2018-11-15 ENCOUNTER — Ambulatory Visit: Payer: Self-pay | Admitting: Physician Assistant

## 2018-11-15 ENCOUNTER — Encounter: Payer: Self-pay | Admitting: Physician Assistant

## 2018-11-15 DIAGNOSIS — I10 Essential (primary) hypertension: Secondary | ICD-10-CM

## 2018-11-15 DIAGNOSIS — E785 Hyperlipidemia, unspecified: Secondary | ICD-10-CM

## 2018-11-15 DIAGNOSIS — K219 Gastro-esophageal reflux disease without esophagitis: Secondary | ICD-10-CM

## 2018-11-15 NOTE — Progress Notes (Signed)
There were no vitals taken for this visit.   Subjective:    Patient ID: Daniel Chung, male    DOB: [birth year], 63 y.o.   MRN: [id number]  HPI: Daniel Chung is a 63 y.o. male presenting on 11/15/2018 for No chief complaint on file.   HPI   This is a telemedicine visit due to coronavirus pandemic.  It is a telephone visit due to pt couldn't get video to work.  I connected with  [name] on 11/15/18 by a video enabled telemedicine application and verified that I am speaking with the correct person using two identifiers.   I discussed the limitations of evaluation and management by telemedicine. The patient expressed understanding and agreed to proceed.  Pt is at home and has been staying at home unless he has needed to go to grocery store.    When asked about his GERD, he says he has stopped coffee, chocolate and has limited his intake of fried foods.  He says that this has helped a lot and he only occasionally takes an OTC [facility] antacid  He is checking his bp at home   -  It has been running 110/60,   120 or 130/63  Pt stopped smoking last year  He says he has been exercising some in his yard.  He is feeling well and is not having any CP, sob or fevers  Pt hasn't gotten rx that was ordered from [facility] on  3.25-  He has enough that he doesn't need rx sent to local pharmacy.  Relevant past medical, surgical, family and social history reviewed and updated as indicated. Interim medical history since our last visit reviewed. Allergies and medications reviewed and updated.   Current Outpatient Medications:  .  amLODipine (NORVASC) 10 MG tablet, Take 1 tablet (10 mg total) by mouth daily., Disp: 90 tablet, Rfl: 2 .  aspirin 81 MG chewable tablet, Chew 81 mg by mouth daily., Disp: , Rfl:  .  atorvastatin (LIPITOR) 20 MG tablet, Take 1 tablet (20 mg total) by mouth daily., Disp: 90 tablet, Rfl: 2 .  lisinopril (PRINIVIL,ZESTRIL) 20 MG tablet, Take 1 tablet (20 mg total) by mouth  daily., Disp: 90 tablet, Rfl: 2 .  metoprolol tartrate (LOPRESSOR) 100 MG tablet, Take 1 tablet (100 mg total) by mouth 2 (two) times daily., Disp: 180 tablet, Rfl: 1 .  PROVENTIL HFA 108 (90 Base) MCG/ACT inhaler, INHALE 2 PUFFS BY MOUTH EVERY 6 HOURS AS NEEDED FOR WHEEZING OR SHORTNESS OF BREATH, Disp: 20.1 g, Rfl: 2 .  calcium carbonate (OSCAL) 1500 (600 Ca) MG TABS tablet, Take 600 mg of elemental calcium by mouth daily., Disp: , Rfl:  .  Calcium Carbonate Antacid (TUMS CHEWY BITES PO), Take 1 tablet by mouth daily., Disp: , Rfl:  .  Omega-3 Fatty Acids (FISH OIL) 1000 MG CAPS, Take 1 capsule by mouth daily. , Disp: , Rfl:     Review of Systems  Per HPI unless specifically indicated above     Objective:    There were no vitals taken for this visit.  Wt Readings from Last 3 Encounters:  08/09/18 201 lb (91.2 kg)  05/03/18 197 lb 4 oz (89.5 kg)  01/31/18 191 lb (86.6 kg)    Physical Exam Pulmonary:     Effort: Pulmonary effort is normal. No respiratory distress.     Comments: Pt speaks in complete sentences without dyspnea Neurological:     Mental Status: He is alert and oriented to person, place,  and time.  Psychiatric:        Mood and Affect: Mood normal.          Assessment & Plan:    Encounter Diagnoses  Name Primary?  . Essential hypertension Yes  . Hyperlipidemia, unspecified hyperlipidemia type   . Gastroesophageal reflux disease, esophagitis presence not specified      -pt to continue current medications.  Will have nurse check with [facility] on shipping order from 3/23 -pt encouraged to continue healthy habits -will defer labs at this time -pt to follow up in 3 months and will update labs then.  Pt is to contact office sooner prn

## 2019-01-30 ENCOUNTER — Other Ambulatory Visit: Payer: Self-pay

## 2019-01-30 ENCOUNTER — Other Ambulatory Visit (HOSPITAL_COMMUNITY)
Admission: RE | Admit: 2019-01-30 | Discharge: 2019-01-30 | Disposition: A | Discharge status: Home / Self Care | Payer: Self-pay | Source: Ambulatory Visit | Attending: Physician Assistant | Admitting: Physician Assistant

## 2019-01-30 DIAGNOSIS — I10 Essential (primary) hypertension: Secondary | ICD-10-CM | POA: Insufficient documentation

## 2019-01-30 DIAGNOSIS — E785 Hyperlipidemia, unspecified: Secondary | ICD-10-CM | POA: Insufficient documentation

## 2019-01-30 LAB — COMPREHENSIVE METABOLIC PANEL
ALT: 30 U/L (ref 0–44)
AST: 20 U/L (ref 15–41)
Albumin: 4.1 g/dL (ref 3.5–5.0)
Alkaline Phosphatase: 101 U/L (ref 38–126)
Anion gap: 9 (ref 5–15)
BUN: 13 mg/dL (ref 8–23)
CO2: 25 mmol/L (ref 22–32)
Calcium: 9.4 mg/dL (ref 8.9–10.3)
Chloride: 104 mmol/L (ref 98–111)
Creatinine, Ser: 1 mg/dL (ref 0.61–1.24)
GFR calc Af Amer: >60 mL/min (ref >60)
GFR calc non Af Amer: >60 mL/min (ref >60)
Glucose, Bld: 126 mg/dL — ABNORMAL HIGH (ref 70–99)
Potassium: 4.4 mmol/L (ref 3.5–5.1)
Sodium: 138 mmol/L (ref 135–145)
Total Bilirubin: 0.6 mg/dL (ref 0.3–1.2)
Total Protein: 7.4 g/dL (ref 6.5–8.1)

## 2019-01-30 LAB — LIPID PANEL
Cholesterol: 164 mg/dL (ref 0–200)
HDL: 30 mg/dL — ABNORMAL LOW (ref >40)
LDL Cholesterol: 99 mg/dL (ref 0–99)
Total CHOL/HDL Ratio: 5.5 RATIO
Triglycerides: 175 mg/dL — ABNORMAL HIGH (ref <150)
VLDL: 35 mg/dL (ref 0–40)

## 2019-02-06 ENCOUNTER — Encounter: Payer: Self-pay | Admitting: Physician Assistant

## 2019-02-06 ENCOUNTER — Ambulatory Visit: Payer: Self-pay | Admitting: Physician Assistant

## 2019-02-06 ENCOUNTER — Other Ambulatory Visit: Payer: Self-pay

## 2019-02-06 VITALS — BP 130/88 | HR 64 | Temp 98.1°F | Wt 201.2 lb

## 2019-02-06 DIAGNOSIS — I10 Essential (primary) hypertension: Secondary | ICD-10-CM

## 2019-02-06 DIAGNOSIS — Z125 Encounter for screening for malignant neoplasm of prostate: Secondary | ICD-10-CM

## 2019-02-06 DIAGNOSIS — E785 Hyperlipidemia, unspecified: Secondary | ICD-10-CM

## 2019-02-06 MED ORDER — ATORVASTATIN CALCIUM 20 MG PO TABS: 20.0000 mg | ORAL_TABLET | Freq: Every day | ORAL | 4 refills | Status: DC

## 2019-02-06 MED ORDER — AMLODIPINE BESYLATE 10 MG PO TABS: 10.0000 mg | ORAL_TABLET | Freq: Every day | ORAL | 5 refills | Status: DC

## 2019-02-06 MED ORDER — LISINOPRIL 20 MG PO TABS: 20.0000 mg | ORAL_TABLET | Freq: Every day | ORAL | 5 refills | Status: DC

## 2019-02-06 MED ORDER — METOPROLOL TARTRATE 100 MG PO TABS: 100.0000 mg | ORAL_TABLET | Freq: Two times a day (BID) | ORAL | 5 refills | Status: DC

## 2019-02-06 NOTE — Progress Notes (Signed)
BP 130/88   Pulse 64   Temp 98.1 F (36.7 C)   Wt 201 lb 3.2 oz (91.3 kg)   SpO2 95%   BMI 28.06 kg/m    Subjective:    Patient ID: Daniel Chung, male    DOB: [birth year], 63 y.o.   MRN: [id number]  HPI: Daniel Chung is a 63 y.o. male presenting on 02/06/2019 for Follow-up   HPI   Pt had negative screeningquestionnairefor CV19  Pt checks his bp at home.  Pt says he is having no cp, sob, fever.   Pt says he sometimes wears a mask when he goes out.  He says he went to [employer] about a month ago.  Pt says his medicare starts up march 2021.   Relevant past medical, surgical, family and social history reviewed and updated as indicated. Interim medical history since our last visit reviewed. Allergies and medications reviewed and updated.   Current Outpatient Medications:  .  amLODipine (NORVASC) 10 MG tablet, Take 1 tablet (10 mg total) by mouth daily., Disp: 90 tablet, Rfl: 2 .  aspirin 81 MG chewable tablet, Chew 81 mg by mouth daily., Disp: , Rfl:  .  atorvastatin (LIPITOR) 20 MG tablet, Take 1 tablet (20 mg total) by mouth daily., Disp: 90 tablet, Rfl: 2 .  lisinopril (PRINIVIL,ZESTRIL) 20 MG tablet, Take 1 tablet (20 mg total) by mouth daily., Disp: 90 tablet, Rfl: 2 .  metoprolol tartrate (LOPRESSOR) 100 MG tablet, Take 1 tablet (100 mg total) by mouth 2 (two) times daily., Disp: 180 tablet, Rfl: 1 .  PROVENTIL HFA 108 (90 Base) MCG/ACT inhaler, INHALE 2 PUFFS BY MOUTH EVERY 6 HOURS AS NEEDED FOR WHEEZING OR SHORTNESS OF BREATH, Disp: 20.1 g, Rfl: 2 .  calcium carbonate (OSCAL) 1500 (600 Ca) MG TABS tablet, Take 600 mg of elemental calcium by mouth daily., Disp: , Rfl:  .  Calcium Carbonate Antacid (TUMS CHEWY BITES PO), Take 1 tablet by mouth daily., Disp: , Rfl:  .  Omega-3 Fatty Acids (FISH OIL) 1000 MG CAPS, Take 1 capsule by mouth daily. , Disp: , Rfl:    Review of Systems  Per HPI unless specifically indicated above     Objective:    BP 130/88   Pulse 64    Temp 98.1 F (36.7 C)   Wt 201 lb 3.2 oz (91.3 kg)   SpO2 95%   BMI 28.06 kg/m   Wt Readings from Last 3 Encounters:  02/06/19 201 lb 3.2 oz (91.3 kg)  08/09/18 201 lb (91.2 kg)  05/03/18 197 lb 4 oz (89.5 kg)    Physical Exam Vitals signs reviewed.  Constitutional:      Appearance: He is well-developed.  HENT:     Head: Normocephalic and atraumatic.  Neck:     Musculoskeletal: Neck supple.  Cardiovascular:     Rate and Rhythm: Normal rate and regular rhythm.  Pulmonary:     Effort: Pulmonary effort is normal.     Breath sounds: Normal breath sounds. No wheezing.  Abdominal:     General: Bowel sounds are normal.     Palpations: Abdomen is soft.     Tenderness: There is no abdominal tenderness.  Musculoskeletal:     Right lower leg: No edema.     Left lower leg: No edema.  Lymphadenopathy:     Cervical: No cervical adenopathy.  Skin:    General: Skin is warm and dry.  Neurological:     Mental Status: He is  alert and oriented to person, place, and time.  Psychiatric:        Behavior: Behavior normal.     Results for orders placed or performed during the hospital encounter of 01/30/19  Lipid panel  Result Value Ref Range   Cholesterol 164 0 - 200 mg/dL   Triglycerides 175 (H) <150 mg/dL   HDL 30 (L) >40 mg/dL   Total CHOL/HDL Ratio 5.5 RATIO   VLDL 35 0 - 40 mg/dL   LDL Cholesterol 99 0 - 99 mg/dL  Comprehensive metabolic panel  Result Value Ref Range   Sodium 138 135 - 145 mmol/L   Potassium 4.4 3.5 - 5.1 mmol/L   Chloride 104 98 - 111 mmol/L   CO2 25 22 - 32 mmol/L   Glucose, Bld 126 (H) 70 - 99 mg/dL   BUN 13 8 - 23 mg/dL   Creatinine, Ser 1.00 0.61 - 1.24 mg/dL   Calcium 9.4 8.9 - 10.3 mg/dL   Total Protein 7.4 6.5 - 8.1 g/dL   Albumin 4.1 3.5 - 5.0 g/dL   AST 20 15 - 41 U/L   ALT 30 0 - 44 U/L   Alkaline Phosphatase 101 38 - 126 U/L   Total Bilirubin 0.6 0.3 - 1.2 mg/dL   GFR calc non Af Amer >60 >60 mL/min   GFR calc Af Amer >60 >60 mL/min    Anion gap 9 5 - 15      Assessment & Plan:    Encounter Diagnoses  Name Primary?  . Essential hypertension Yes  . Hyperlipidemia, unspecified hyperlipidemia type      -Reviewed labs with pt  -[facility]  -no changes to medications today  -encouraged pt to wear mask anytime he goes out in public.  Encouraged him to avoid large crowds to reduce chances of getting CV19  -pt to follow up 4 months.  He will have evisit as he can monitor his BP at home.  Pt to contact office sooner prn

## 2019-05-16 ENCOUNTER — Other Ambulatory Visit: Payer: Self-pay | Admitting: Physician Assistant

## 2019-05-16 MED ORDER — ATORVASTATIN CALCIUM 20 MG PO TABS: 20.0000 mg | ORAL_TABLET | Freq: Every day | ORAL | 4 refills | Status: DC

## 2019-06-07 ENCOUNTER — Other Ambulatory Visit (HOSPITAL_COMMUNITY)
Admission: RE | Admit: 2019-06-07 | Discharge: 2019-06-07 | Disposition: A | Discharge status: Home / Self Care | Payer: Self-pay | Source: Ambulatory Visit | Attending: Physician Assistant | Admitting: Physician Assistant

## 2019-06-07 DIAGNOSIS — Z125 Encounter for screening for malignant neoplasm of prostate: Secondary | ICD-10-CM | POA: Insufficient documentation

## 2019-06-07 DIAGNOSIS — E785 Hyperlipidemia, unspecified: Secondary | ICD-10-CM | POA: Insufficient documentation

## 2019-06-07 DIAGNOSIS — I10 Essential (primary) hypertension: Secondary | ICD-10-CM | POA: Insufficient documentation

## 2019-06-07 LAB — COMPREHENSIVE METABOLIC PANEL
ALT: 24 U/L (ref 0–44)
AST: 20 U/L (ref 15–41)
Albumin: 4.3 g/dL (ref 3.5–5.0)
Alkaline Phosphatase: 90 U/L (ref 38–126)
Anion gap: 9 (ref 5–15)
BUN: 14 mg/dL (ref 8–23)
CO2: 23 mmol/L (ref 22–32)
Calcium: 9.1 mg/dL (ref 8.9–10.3)
Chloride: 104 mmol/L (ref 98–111)
Creatinine, Ser: 1.01 mg/dL (ref 0.61–1.24)
GFR calc Af Amer: >60 mL/min (ref >60)
GFR calc non Af Amer: >60 mL/min (ref >60)
Glucose, Bld: 117 mg/dL — ABNORMAL HIGH (ref 70–99)
Potassium: 4.3 mmol/L (ref 3.5–5.1)
Sodium: 136 mmol/L (ref 135–145)
Total Bilirubin: 0.6 mg/dL (ref 0.3–1.2)
Total Protein: 7.3 g/dL (ref 6.5–8.1)

## 2019-06-07 LAB — LIPID PANEL
Cholesterol: 161 mg/dL (ref 0–200)
HDL: 28 mg/dL — ABNORMAL LOW (ref >40)
LDL Cholesterol: 87 mg/dL (ref 0–99)
Total CHOL/HDL Ratio: 5.8 RATIO
Triglycerides: 229 mg/dL — ABNORMAL HIGH (ref <150)
VLDL: 46 mg/dL — ABNORMAL HIGH (ref 0–40)

## 2019-06-07 LAB — PSA: Prostatic Specific Antigen: 1.11 ng/mL (ref 0.00–4.00)

## 2019-06-13 ENCOUNTER — Encounter: Payer: Self-pay | Admitting: Physician Assistant

## 2019-06-13 ENCOUNTER — Ambulatory Visit: Payer: Self-pay | Admitting: Physician Assistant

## 2019-06-13 VITALS — BP 111/71 | Wt 202.0 lb

## 2019-06-13 DIAGNOSIS — J449 Chronic obstructive pulmonary disease, unspecified: Secondary | ICD-10-CM

## 2019-06-13 DIAGNOSIS — E785 Hyperlipidemia, unspecified: Secondary | ICD-10-CM

## 2019-06-13 DIAGNOSIS — R7303 Prediabetes: Secondary | ICD-10-CM

## 2019-06-13 DIAGNOSIS — I10 Essential (primary) hypertension: Secondary | ICD-10-CM

## 2019-06-13 NOTE — Progress Notes (Signed)
BP 111/71   Wt 202 lb (91.6 kg)   BMI 28.17 kg/m    Subjective:    Patient ID: Daniel Chung, male    DOB: 02/26/1956, 63 y.o.   MRN: [id number]  HPI: Daniel Chung is a 63 y.o. male presenting on 06/13/2019 for No chief complaint on file.   HPI   This is a telemedicine visit due to coronavirus pandemic.  It is via telephone as pt does not have a video enabled device.   I connected with  [name] on 06/13/19 by a video enabled telemedicine application and verified that I am speaking with the correct person using two identifiers.   I discussed the limitations of evaluation and management by telemedicine. The patient expressed understanding and agreed to proceed.  Pt is at home.  Provider is at office.    Patient is a 63 year old male with a history of hypertension and dyslipidemia who has appointment today for follow-up.   Pt is doing well.  He has no complaints.  Patient says that his Medicare starts on March 1 and reading up with patient associated with that.  Patient continues to monitor his blood pressure at home.  He says it has been doing well lately.  Patient has stopped taking his fish oil.  Patient asks about going to [place] next week to attend his granddaughter's wedding.  Patient has been watching the news recently and is aware that the COVID-19 pandemic is worsening, particularly in [place], where some parts of [place] has been having hospital shortages.  Patient says that the wedding is supposed to have 150 guests attending.  He says the reception is supposed to be outdoors.  He says he is mostly concerned about the church part with that many people "crammed into a church".  Discussed of the risks with the patient including the thoughts that the reception might not be particularly safe either even though at the outside that is a lot he pulled out the catheter.  Patient is seems very knowledgeable about the risks associated with travel to this type of thing.  Discussed  with patient the final decision on whether to attend or not is his to make.  Encouraged patient that if he should decide to go, he needs to wear a mask at all times and recommended keeping him here in sanitizer in his pocket.   Relevant past medical, surgical, family and social history reviewed and updated as indicated. Interim medical history since our last visit reviewed. Allergies and medications reviewed and updated.   Current Outpatient Medications:  .  amLODipine (NORVASC) 10 MG tablet, Take 1 tablet (10 mg total) by mouth daily., Disp: 30 tablet, Rfl: 5 .  aspirin 81 MG chewable tablet, Chew 81 mg by mouth daily., Disp: , Rfl:  .  atorvastatin (LIPITOR) 20 MG tablet, Take 1 tablet (20 mg total) by mouth daily., Disp: 30 tablet, Rfl: 4 .  lisinopril (ZESTRIL) 20 MG tablet, Take 1 tablet (20 mg total) by mouth daily., Disp: 30 tablet, Rfl: 5 .  metoprolol tartrate (LOPRESSOR) 100 MG tablet, Take 1 tablet (100 mg total) by mouth 2 (two) times daily., Disp: 60 tablet, Rfl: 5 .  PROVENTIL HFA 108 (90 Base) MCG/ACT inhaler, INHALE 2 PUFFS BY MOUTH EVERY 6 HOURS AS NEEDED FOR WHEEZING OR SHORTNESS OF BREATH, Disp: 20.1 g, Rfl: 2 .  calcium carbonate (OSCAL) 1500 (600 Ca) MG TABS tablet, Take 600 mg of elemental calcium by mouth daily., Disp: , Rfl:  .  Calcium Carbonate Antacid (TUMS CHEWY BITES PO), Take 1 tablet by mouth daily., Disp: , Rfl:  .  Omega-3 Fatty Acids (FISH OIL) 1000 MG CAPS, Take 1 capsule by mouth daily. , Disp: , Rfl:     Review of Systems  Per HPI unless specifically indicated above     Objective:    BP 111/71   Wt 202 lb (91.6 kg)   BMI 28.17 kg/m   Wt Readings from Last 3 Encounters:  06/13/19 202 lb (91.6 kg)  02/06/19 201 lb 3.2 oz (91.3 kg)  08/09/18 201 lb (91.2 kg)    Physical Exam Pulmonary:     Effort: No respiratory distress.  Neurological:     Mental Status: He is alert and oriented to person, place, and time.  Psychiatric:        Attention and  Perception: Attention normal.        Mood and Affect: Mood normal.        Speech: Speech normal.        Behavior: Behavior is cooperative.     Results for orders placed or performed during the hospital encounter of 06/07/19  PSA  Result Value Ref Range   Prostatic Specific Antigen 1.11 0.00 - 4.00 ng/mL  Lipid panel  Result Value Ref Range   Cholesterol 161 0 - 200 mg/dL   Triglycerides 229 (H) <150 mg/dL   HDL 28 (L) >40 mg/dL   Total CHOL/HDL Ratio 5.8 RATIO   VLDL 46 (H) 0 - 40 mg/dL   LDL Cholesterol 87 0 - 99 mg/dL  Comprehensive metabolic panel  Result Value Ref Range   Sodium 136 135 - 145 mmol/L   Potassium 4.3 3.5 - 5.1 mmol/L   Chloride 104 98 - 111 mmol/L   CO2 23 22 - 32 mmol/L   Glucose, Bld 117 (H) 70 - 99 mg/dL   BUN 14 8 - 23 mg/dL   Creatinine, Ser 1.01 0.61 - 1.24 mg/dL   Calcium 9.1 8.9 - 10.3 mg/dL   Total Protein 7.3 6.5 - 8.1 g/dL   Albumin 4.3 3.5 - 5.0 g/dL   AST 20 15 - 41 U/L   ALT 24 0 - 44 U/L   Alkaline Phosphatase 90 38 - 126 U/L   Total Bilirubin 0.6 0.3 - 1.2 mg/dL   GFR calc non Af Amer >60 >60 mL/min   GFR calc Af Amer >60 >60 mL/min   Anion gap 9 5 - 15      Assessment & Plan:   1.  Hypertension  Labs are reviewed with patient.  Blood pressure is well controlled.  Patient to continue his amlodipine, lisinopril, and metoprolol.  Patient is encouraged to continue to monitor his blood pressure.  2.  Dyslipidemia  Patient is encouraged to continue atorvastatin and low-fat diet for his lipids.  He is encouraged to resume his fish oil to help lower his triglycerides.   3.  COPD  Breathing is doing well since he stopped smoking last year.  Patient has only needed his Proventil once every 3 or 4 months.  He has the inhaler to use if needed.  4.  Prediabetes  Patient will need to have his A1c rechecked next time he gets his labs drawn.  5.  Healthcare maintenance   ifobt given to pt for colon cancer screening but was never  brought back/returned to office  Patient's Medicare starts 10/01/19.  Follow-up appointment will not be scheduled at this time as his Medicare starts in  3 months.  Patient is instructed to contact office if he needs anything prior to that.  Patient is encouraged to go ahead and get an call around to get an appointment schedule for a new patient appointment in March with his new provider.

## 2019-12-12 ENCOUNTER — Ambulatory Visit (INDEPENDENT_AMBULATORY_CARE_PROVIDER_SITE_OTHER): Payer: Medicare Other | Admitting: Family Medicine

## 2019-12-12 ENCOUNTER — Encounter (INDEPENDENT_AMBULATORY_CARE_PROVIDER_SITE_OTHER): Payer: Self-pay

## 2019-12-12 ENCOUNTER — Encounter: Payer: Self-pay | Admitting: Family Medicine

## 2019-12-12 ENCOUNTER — Other Ambulatory Visit: Payer: Self-pay

## 2019-12-12 VITALS — BP 126/80 | HR 64 | Temp 97.4°F | Resp 16 | Ht 71.0 in | Wt 203.0 lb

## 2019-12-12 DIAGNOSIS — Z0000 Encounter for general adult medical examination without abnormal findings: Secondary | ICD-10-CM

## 2019-12-12 DIAGNOSIS — Z87891 Personal history of nicotine dependence: Secondary | ICD-10-CM

## 2019-12-12 DIAGNOSIS — J449 Chronic obstructive pulmonary disease, unspecified: Secondary | ICD-10-CM | POA: Diagnosis not present

## 2019-12-12 DIAGNOSIS — H9193 Unspecified hearing loss, bilateral: Secondary | ICD-10-CM

## 2019-12-12 DIAGNOSIS — R7303 Prediabetes: Secondary | ICD-10-CM

## 2019-12-12 DIAGNOSIS — E785 Hyperlipidemia, unspecified: Secondary | ICD-10-CM

## 2019-12-12 DIAGNOSIS — Z1211 Encounter for screening for malignant neoplasm of colon: Secondary | ICD-10-CM

## 2019-12-12 DIAGNOSIS — E663 Overweight: Secondary | ICD-10-CM

## 2019-12-12 DIAGNOSIS — H9313 Tinnitus, bilateral: Secondary | ICD-10-CM

## 2019-12-12 DIAGNOSIS — I10 Essential (primary) hypertension: Secondary | ICD-10-CM | POA: Diagnosis not present

## 2019-12-12 DIAGNOSIS — Z6828 Body mass index (BMI) 28.0-28.9, adult: Secondary | ICD-10-CM

## 2019-12-12 NOTE — Progress Notes (Signed)
Subjective:  Patient ID: Daniel Chung, male    DOB: Apr 14, 1956  Age: 64 y.o. MRN: [id number]  CC:  Chief Complaint  Patient presents with  . Establish Care      HPI  HPI  Daniel Chung is a 64 year old male patient who presents today to establish care.  Previously was seen at the free clinic here in [place].  Has a history of emphysema, GERD, hyperlipidemia, hypertension, tinnitus, smoking is now ex-smoker.  Is in need of updated lab work, colonoscopy, referral to dentist and eye doctor.  Reports that he stopped smoking in 2019 and is overall doing well with this.  Denies having any issues with his COPD is only using a Saba inhaler does not have a LABA inhaler.  Has not been seen by a pulmonologist.  He does have some hard of hearing bilaterally.  Has been checked by an audiologist also continues to have the ringing in the ears.  He has had a surgery on his elbow in the past.  And had tennis elbow.  He also has bilateral hip pain.  He reports his brother had colon cancer in his 76s and is ready to get his updated colonoscopy his last one was around age 59 and he was in [place] at the time.  He does not remember being told that there was anything significant going on at that time.  He reports that he read his reading glasses from the pharmacy had Lasix many years ago, does need to get in with an eye doctor.  At times he does have blurred vision.  He reports that he sleeps fragmented but overall he feels refreshed when he wakes up.  He denies having any trouble or issue with any of the medications he is currently taking.  And overall feels good today.  Today patient denies signs and symptoms of COVID 19 infection including fever, chills, cough, shortness of breath, and headache. Past Medical, Surgical, Social History, Allergies, and Medications have been Reviewed.   Past Medical History:  Diagnosis Date  . Emphysema lung ([place])   . GERD (gastroesophageal reflux disease)   . Hyperlipidemia     . Hypertension   . Peripheral neuropathy   . Tinnitus     Current Meds  Medication Sig  . amLODipine (NORVASC) 10 MG tablet Take 1 tablet (10 mg total) by mouth daily.  Marland Kitchen aspirin 81 MG chewable tablet Chew 81 mg by mouth daily.  Marland Kitchen atorvastatin (LIPITOR) 20 MG tablet Take 1 tablet (20 mg total) by mouth daily.  . Calcium Carbonate Antacid (TUMS CHEWY BITES PO) Take 1 tablet by mouth daily.  Marland Kitchen lisinopril (ZESTRIL) 20 MG tablet Take 1 tablet (20 mg total) by mouth daily.  . metoprolol tartrate (LOPRESSOR) 100 MG tablet Take 1 tablet (100 mg total) by mouth 2 (two) times daily.  . Omega-3 Fatty Acids (FISH OIL) 1000 MG CAPS Take 1 capsule by mouth daily.   Marland Kitchen PROVENTIL HFA 108 (90 Base) MCG/ACT inhaler INHALE 2 PUFFS BY MOUTH EVERY 6 HOURS AS NEEDED FOR WHEEZING OR SHORTNESS OF BREATH    ROS:  Review of Systems  Constitutional: Negative.   HENT: Positive for hearing loss and tinnitus.   Eyes: Positive for blurred vision.  Respiratory: Negative.   Cardiovascular: Negative.   Genitourinary: Positive for frequency.  Musculoskeletal: Positive for joint pain.  Neurological: Negative.   Endo/Heme/Allergies: Negative.   Psychiatric/Behavioral: Negative.      Objective:   Today's Vitals: BP 126/80  Pulse 64   Temp (!) 97.4 F (36.3 C) (Temporal)   Resp 16   Ht 5\' 11"  (1.803 m)   Wt 203 lb (92.1 kg)   SpO2 95%   BMI 28.31 kg/m  Vitals with BMI 12/12/2019 06/13/2019 02/06/2019  Height 5\' 11"  - -  Weight 203 lbs 202 lbs 201 lbs 3 oz  BMI [id number] - -  Systolic [id number]  Diastolic 80 71 88  Pulse 64 - 64     Physical Exam Vitals and nursing note reviewed.  Constitutional:      Appearance: Normal appearance. He is well-developed, well-groomed and overweight.  HENT:     Head: Normocephalic and atraumatic.     Right Ear: External ear normal.     Left Ear: External ear normal.     Mouth/Throat:     Comments: Mask in place Eyes:     General:        Right eye: No discharge.         Left eye: No discharge.     Conjunctiva/sclera: Conjunctivae normal.  Cardiovascular:     Rate and Rhythm: Normal rate and regular rhythm.     Pulses: Normal pulses.     Heart sounds: Normal heart sounds.  Pulmonary:     Effort: Pulmonary effort is normal.     Breath sounds: Normal breath sounds.  Musculoskeletal:        General: Normal range of motion.     Cervical back: Normal range of motion and neck supple.  Skin:    General: Skin is warm.  Neurological:     General: No focal deficit present.     Mental Status: He is alert and oriented to person, place, and time.  Psychiatric:        Attention and Perception: Attention normal.        Mood and Affect: Mood normal.        Speech: Speech normal.        Behavior: Behavior normal. Behavior is cooperative.        Thought Content: Thought content normal.        Cognition and Memory: Cognition normal.        Judgment: Judgment normal.      Assessment   1. Chronic obstructive pulmonary disease, unspecified COPD type ([place])   2. Essential hypertension   3. Hyperlipidemia, unspecified hyperlipidemia type   4. Prediabetes   5. Healthcare maintenance   6. Encounter for screening for malignant neoplasm of colon   7. Ex-cigarette smoker   8. Overweight with body mass index (BMI) of 28 to 28.9 in adult   9. Bilateral hearing loss, unspecified hearing loss type   10. Tinnitus of both ears     Tests ordered Orders Placed This Encounter  Procedures  . CBC  . COMPLETE METABOLIC PANEL WITH GFR  . Hemoglobin A1c  . Lipid panel  . Ambulatory referral to Ophthalmology  . Ambulatory referral to Dentistry  . Ambulatory referral to Gastroenterology     Plan: Please see assessment and plan per problem list above.   No orders of the defined types were placed in this encounter.   Patient to follow-up in 01/08/2020   Perlie Mayo, NP

## 2019-12-12 NOTE — Patient Instructions (Signed)
I appreciate the opportunity to provide you with care for your health and wellness. Today we discussed:  Establish care  Follow up: 4 weeks   Labs fasting in the next week  Referrals today- eye dr, and dentist   Please continue to practice social distancing to keep you, your family, and our community safe.  If you must go out, please wear a mask and practice good handwashing.  It was a pleasure to see you and I look forward to continuing to work together on your health and well-being. Please do not hesitate to call the office if you need care or have questions about your care.  Have a wonderful day and week. With Gratitude, Cherly Beach, DNP, AGNP-BC

## 2019-12-14 DIAGNOSIS — Z6828 Body mass index (BMI) 28.0-28.9, adult: Secondary | ICD-10-CM | POA: Insufficient documentation

## 2019-12-14 DIAGNOSIS — H9313 Tinnitus, bilateral: Secondary | ICD-10-CM | POA: Insufficient documentation

## 2019-12-14 DIAGNOSIS — H9193 Unspecified hearing loss, bilateral: Secondary | ICD-10-CM | POA: Insufficient documentation

## 2019-12-14 NOTE — Assessment & Plan Note (Signed)
Bilateral ringing in his ears.  He reports this has been going on for quite some time.  Has been seen by an audiologist.

## 2019-12-14 NOTE — Assessment & Plan Note (Signed)
Currently is on Lipitor needs to get updated labs encouraged heart healthy low-fat diet at this time.

## 2019-12-14 NOTE — Assessment & Plan Note (Signed)
Hard of hearing bilaterally.  Does not currently wear hearing aids.

## 2019-12-14 NOTE — Assessment & Plan Note (Signed)
Encouraged to focus on a heart healthy low-fat diet and to get 30 to 60 minutes of exercise regularly.

## 2019-12-14 NOTE — Assessment & Plan Note (Signed)
Has been prediabetic is not currently taking any medications for this.  We will get updated labs.

## 2019-12-14 NOTE — Assessment & Plan Note (Addendum)
Referral for eye care and dentistry, colonoscopy as well.

## 2019-12-14 NOTE — Assessment & Plan Note (Signed)
Colonoscopy 5 years overdue.  Last one was in [place].  He reports brother had colon cancer in his 51s.

## 2019-12-14 NOTE — Assessment & Plan Note (Addendum)
He reports this is well controlled and he rarely has to use an inhaler. He only has an albuterol inhaler he is not on Symbicort or any other steroid like inhaler daily.

## 2019-12-14 NOTE — Assessment & Plan Note (Signed)
Stop smoking in 2019 encouraged to continue doing this.

## 2019-12-14 NOTE — Assessment & Plan Note (Signed)
Daniel Chung is encouraged to maintain a well balanced diet that is low in salt. Controlled, continue current medication regimen. Will be looking at medication adjustment or changes as he is on several antihypertensives at this time.  Additionally, he is also reminded that exercise is beneficial for heart health and control of  Blood pressure. 30-60 minutes daily is recommended-walking was suggested.

## 2019-12-17 ENCOUNTER — Encounter (INDEPENDENT_AMBULATORY_CARE_PROVIDER_SITE_OTHER): Payer: Self-pay | Admitting: *Deleted

## 2019-12-26 LAB — CBC
HCT: 48.2 % (ref 38.5–50.0)
Hemoglobin: 16.1 g/dL (ref 13.2–17.1)
MCH: 31.4 pg (ref 27.0–33.0)
MCHC: 33.4 g/dL (ref 32.0–36.0)
MCV: 94.1 fL (ref 80.0–100.0)
MPV: 10.9 fL (ref 7.5–12.5)
Platelets: 213 10*3/uL (ref 140–400)
RBC: 5.12 10*6/uL (ref 4.20–5.80)
RDW: 12.5 % (ref 11.0–15.0)
WBC: 8.9 10*3/uL (ref 3.8–10.8)

## 2019-12-26 LAB — LIPID PANEL
Cholesterol: 210 mg/dL — ABNORMAL HIGH (ref <200)
HDL: 33 mg/dL — ABNORMAL LOW (ref >=40)
LDL Cholesterol (Calc): 130 mg/dL (calc) — ABNORMAL HIGH
Non-HDL Cholesterol (Calc): 177 mg/dL (calc) — ABNORMAL HIGH (ref <130)
Total CHOL/HDL Ratio: 6.4 (calc) — ABNORMAL HIGH (ref <5.0)
Triglycerides: 330 mg/dL — ABNORMAL HIGH (ref <150)

## 2019-12-26 LAB — COMPLETE METABOLIC PANEL WITH GFR
AG Ratio: 1.7 (calc) (ref 1.0–2.5)
ALT: 19 U/L (ref 9–46)
AST: 18 U/L (ref 10–35)
Albumin: 4.2 g/dL (ref 3.6–5.1)
Alkaline phosphatase (APISO): 90 U/L (ref 35–144)
BUN: 14 mg/dL (ref 7–25)
CO2: 27 mmol/L (ref 20–32)
Calcium: 9.7 mg/dL (ref 8.6–10.3)
Chloride: 101 mmol/L (ref 98–110)
Creat: 1.07 mg/dL (ref 0.70–1.25)
GFR, Est African American: 84 mL/min/{1.73_m2} (ref >=60)
GFR, Est Non African American: 72 mL/min/{1.73_m2} (ref >=60)
Globulin: 2.5 g/dL (calc) (ref 1.9–3.7)
Glucose, Bld: 105 mg/dL — ABNORMAL HIGH (ref 65–99)
Potassium: 4.5 mmol/L (ref 3.5–5.3)
Sodium: 138 mmol/L (ref 135–146)
Total Bilirubin: 0.3 mg/dL (ref 0.2–1.2)
Total Protein: 6.7 g/dL (ref 6.1–8.1)

## 2019-12-26 LAB — HEMOGLOBIN A1C
Hgb A1c MFr Bld: 6.1 % of total Hgb — ABNORMAL HIGH (ref <5.7)
Mean Plasma Glucose: 128 (calc)
eAG (mmol/L): 7.1 (calc)

## 2020-01-01 ENCOUNTER — Other Ambulatory Visit: Payer: Self-pay | Admitting: Family Medicine

## 2020-01-01 ENCOUNTER — Telehealth: Payer: Self-pay

## 2020-01-01 DIAGNOSIS — E785 Hyperlipidemia, unspecified: Secondary | ICD-10-CM

## 2020-01-01 DIAGNOSIS — I10 Essential (primary) hypertension: Secondary | ICD-10-CM

## 2020-01-01 MED ORDER — METOPROLOL TARTRATE 100 MG PO TABS: 100.0000 mg | ORAL_TABLET | Freq: Every day | ORAL | 1 refills | Status: DC

## 2020-01-01 MED ORDER — ATORVASTATIN CALCIUM 20 MG PO TABS: 20.0000 mg | ORAL_TABLET | Freq: Every day | ORAL | 4 refills | Status: DC

## 2020-01-01 MED ORDER — LISINOPRIL 40 MG PO TABS: 40.0000 mg | ORAL_TABLET | Freq: Every day | ORAL | 1 refills | Status: DC

## 2020-01-01 MED ORDER — AMLODIPINE BESYLATE 10 MG PO TABS: 10.0000 mg | ORAL_TABLET | Freq: Every day | ORAL | 1 refills | Status: DC

## 2020-01-01 NOTE — Telephone Encounter (Signed)
Returned call regarding lab results.

## 2020-01-01 NOTE — Telephone Encounter (Signed)
Pt returning your call

## 2020-01-03 ENCOUNTER — Ambulatory Visit: Payer: Self-pay | Admitting: Family Medicine

## 2020-01-08 ENCOUNTER — Other Ambulatory Visit: Payer: Self-pay

## 2020-01-08 ENCOUNTER — Encounter: Payer: Self-pay | Admitting: Family Medicine

## 2020-01-08 ENCOUNTER — Ambulatory Visit (INDEPENDENT_AMBULATORY_CARE_PROVIDER_SITE_OTHER): Payer: Medicare Other | Admitting: Family Medicine

## 2020-01-08 ENCOUNTER — Ambulatory Visit: Payer: Medicare Other | Admitting: Family Medicine

## 2020-01-08 VITALS — BP 115/79 | HR 68 | Temp 97.0°F | Ht 72.0 in | Wt 202.4 lb

## 2020-01-08 DIAGNOSIS — I10 Essential (primary) hypertension: Secondary | ICD-10-CM | POA: Diagnosis not present

## 2020-01-08 DIAGNOSIS — E785 Hyperlipidemia, unspecified: Secondary | ICD-10-CM | POA: Diagnosis not present

## 2020-01-08 NOTE — Assessment & Plan Note (Signed)
Heart healthy diet is encouraged.  As his cholesterol levels were elevated.  Follow-up for recheck prior to next appointment lab was ordered.

## 2020-01-08 NOTE — Assessment & Plan Note (Signed)
Continue current medications as we have discussed.  We will be doing the metoprolol week.  To help reduce pill burden and adjustment of medications for maximum benefit.  He is encouraged to focus on DASH diet and exercise. Patient acknowledged agreement and understanding of the plan.

## 2020-01-08 NOTE — Patient Instructions (Addendum)
I appreciate the opportunity to provide you with care for your health and wellness. Today we discussed: blood pressure   Follow up: 3 months for BP  Labs-fasting that day or week before No referrals today  Medication Changes   Take the Metoprolol 100 mg for 2 weeks Then split 50 mg for 2 weeks Then quarter 25 mg for 2 weeks  Then 25 mg every other day for a week Then stop  If you experience a headache, chest pain, palpitation, heart racing Call the office and we will discuss next plan  Please continue to practice social distancing to keep you, your family, and our community safe.  If you must go out, please wear a mask and practice good handwashing.  It was a pleasure to see you and I look forward to continuing to work together on your health and well-being. Please do not hesitate to call the office if you need care or have questions about your care.  Have a wonderful day and week. With Gratitude, Cherly Beach, DNP, AGNP-BC

## 2020-01-08 NOTE — Progress Notes (Signed)
Subjective:  Patient ID: Daniel Chung, male    DOB: October 26, 1955  Age: 64 y.o. MRN: [id number]  CC:  Chief Complaint  Patient presents with   Follow-up    4 week follow up       HPI  HPI   Daniel Chung is a 64 year old male patient who presents today for 4-week follow-up.  Overall he reports that he is doing well.   Denies have any chest pain, shortness of breath, leg swelling, palpitations, changes in vision, dizziness or headaches.  Denies having any changes in bowel or bladder habits.  Reports that he is sleeping well.  Mood is stable.  Denies have any achy joints or pains.  He just recently picked up his new prescription medications.  We are doing a Toprol week.  Metoprolol was at 100 mg twice daily.  It is been reduced to 100 mg once daily.  And further reduction we will be performed over the next month and a half.  Education was provided on this.  He verbalized understanding.  He is trying to work on a heart healthy diet but does eat some bacon.neuro he reports that he is done really well and trying to get his diet under control.  He has no other issues or concerns to discuss today.  Today patient denies signs and symptoms of COVID 19 infection including fever, chills, cough, shortness of breath, and headache. Past Medical, Surgical, Social History, Allergies, and Medications have been Reviewed.   Past Medical History:  Diagnosis Date   Emphysema lung ([facility])    GERD (gastroesophageal reflux disease)    Hyperlipidemia    Hypertension    Peripheral neuropathy    Tinnitus     Current Meds  Medication Sig   amLODipine (NORVASC) 10 MG tablet Take 1 tablet (10 mg total) by mouth daily.   aspirin 81 MG chewable tablet Chew 81 mg by mouth daily.   atorvastatin (LIPITOR) 20 MG tablet Take 1 tablet (20 mg total) by mouth daily.   Calcium Carbonate Antacid (TUMS CHEWY BITES PO) Take 1 tablet by mouth daily.   lisinopril (ZESTRIL) 40 MG tablet Take 1 tablet (40 mg total) by  mouth daily.   metoprolol tartrate (LOPRESSOR) 100 MG tablet Take 1 tablet (100 mg total) by mouth daily.   Omega-3 Fatty Acids (FISH OIL) 1000 MG CAPS Take 1 capsule by mouth daily.    PROVENTIL HFA 108 (90 Base) MCG/ACT inhaler INHALE 2 PUFFS BY MOUTH EVERY 6 HOURS AS NEEDED FOR WHEEZING OR SHORTNESS OF BREATH    ROS:  Review of Systems  Constitutional: Negative.   HENT: Negative.   Eyes: Negative.   Respiratory: Negative.   Cardiovascular: Negative.   Gastrointestinal: Negative.   Genitourinary: Negative.   Musculoskeletal: Negative.   Skin: Negative.   Neurological: Negative.   Endo/Heme/Allergies: Negative.   Psychiatric/Behavioral: Negative.   All other systems reviewed and are negative.    Objective:   Today's Vitals: BP 115/79 (BP Location: Left Arm, Patient Position: Sitting, Cuff Size: Normal)    Pulse 68    Temp (!) 97 F (36.1 C) (Temporal)    Ht 6' (1.829 m)    Wt 202 lb 6.4 oz (91.8 kg)    SpO2 96%    BMI 27.45 kg/m  Vitals with BMI 01/08/2020 12/12/2019 06/13/2019  Height 6\' 0"  5\' 11"  -  Weight 202 lbs 6 oz 203 lbs 202 lbs  BMI 81.85 63.14 -  Systolic 970 263 785  Diastolic  79 80 71  Pulse 68 64 -     Physical Exam Vitals and nursing note reviewed.  Constitutional:      Appearance: Normal appearance. He is well-developed, well-groomed and overweight.  HENT:     Head: Normocephalic and atraumatic.     Right Ear: External ear normal.     Left Ear: External ear normal.     Mouth/Throat:     Comments: Mask in place  Eyes:     General:        Right eye: No discharge.        Left eye: No discharge.     Conjunctiva/sclera: Conjunctivae normal.  Cardiovascular:     Rate and Rhythm: Normal rate and regular rhythm.     Pulses: Normal pulses.     Heart sounds: Normal heart sounds.  Pulmonary:     Effort: Pulmonary effort is normal.     Breath sounds: Normal breath sounds.  Musculoskeletal:        General: Normal range of motion.     Cervical back:  Normal range of motion and neck supple.  Skin:    General: Skin is warm.  Neurological:     General: No focal deficit present.     Mental Status: He is alert and oriented to person, place, and time.  Psychiatric:        Attention and Perception: Attention normal.        Mood and Affect: Mood normal.        Speech: Speech normal.        Behavior: Behavior normal. Behavior is cooperative.        Thought Content: Thought content normal.        Cognition and Memory: Cognition normal.        Judgment: Judgment normal.     Assessment   1. Essential hypertension   2. Hyperlipidemia, unspecified hyperlipidemia type     Tests ordered Orders Placed This Encounter  Procedures   COMPLETE METABOLIC PANEL WITH GFR   Lipid panel     Plan: Please see assessment and plan per problem list above.   No orders of the defined types were placed in this encounter.   Patient to follow-up in 3 months  Perlie Mayo, NP

## 2020-03-21 ENCOUNTER — Other Ambulatory Visit: Payer: Self-pay

## 2020-03-21 DIAGNOSIS — E785 Hyperlipidemia, unspecified: Secondary | ICD-10-CM

## 2020-03-21 MED ORDER — ATORVASTATIN CALCIUM 20 MG PO TABS: 20.0000 mg | ORAL_TABLET | Freq: Every day | ORAL | 0 refills | Status: DC

## 2020-04-01 LAB — COMPLETE METABOLIC PANEL WITH GFR
AG Ratio: 1.6 (calc) (ref 1.0–2.5)
ALT: 19 U/L (ref 9–46)
AST: 15 U/L (ref 10–35)
Albumin: 4.2 g/dL (ref 3.6–5.1)
Alkaline phosphatase (APISO): 96 U/L (ref 35–144)
BUN: 17 mg/dL (ref 7–25)
CO2: 23 mmol/L (ref 20–32)
Calcium: 9.7 mg/dL (ref 8.6–10.3)
Chloride: 103 mmol/L (ref 98–110)
Creat: 0.94 mg/dL (ref 0.70–1.25)
GFR, Est African American: 98 mL/min/{1.73_m2} (ref >=60)
GFR, Est Non African American: 85 mL/min/{1.73_m2} (ref >=60)
Globulin: 2.6 g/dL (calc) (ref 1.9–3.7)
Glucose, Bld: 115 mg/dL — ABNORMAL HIGH (ref 65–99)
Potassium: 4.5 mmol/L (ref 3.5–5.3)
Sodium: 135 mmol/L (ref 135–146)
Total Bilirubin: 0.5 mg/dL (ref 0.2–1.2)
Total Protein: 6.8 g/dL (ref 6.1–8.1)

## 2020-04-01 LAB — LIPID PANEL
Cholesterol: 164 mg/dL (ref <200)
HDL: 29 mg/dL — ABNORMAL LOW (ref >=40)
LDL Cholesterol (Calc): 104 mg/dL (calc) — ABNORMAL HIGH
Non-HDL Cholesterol (Calc): 135 mg/dL (calc) — ABNORMAL HIGH (ref <130)
Total CHOL/HDL Ratio: 5.7 (calc) — ABNORMAL HIGH (ref <5.0)
Triglycerides: 190 mg/dL — ABNORMAL HIGH (ref <150)

## 2020-04-09 ENCOUNTER — Telehealth (INDEPENDENT_AMBULATORY_CARE_PROVIDER_SITE_OTHER): Payer: Medicare Other | Admitting: Family Medicine

## 2020-04-09 ENCOUNTER — Encounter: Payer: Self-pay | Admitting: Family Medicine

## 2020-04-09 ENCOUNTER — Other Ambulatory Visit: Payer: Self-pay

## 2020-04-09 VITALS — BP 115/79 | Ht 72.0 in | Wt 197.0 lb

## 2020-04-09 DIAGNOSIS — Z6828 Body mass index (BMI) 28.0-28.9, adult: Secondary | ICD-10-CM | POA: Diagnosis not present

## 2020-04-09 DIAGNOSIS — I10 Essential (primary) hypertension: Secondary | ICD-10-CM

## 2020-04-09 DIAGNOSIS — E663 Overweight: Secondary | ICD-10-CM | POA: Diagnosis not present

## 2020-04-09 DIAGNOSIS — J449 Chronic obstructive pulmonary disease, unspecified: Secondary | ICD-10-CM

## 2020-04-09 NOTE — Assessment & Plan Note (Signed)
Remains well controlled.  Declined getting flu shot.  Has an albuterol inhaler he uses intermittently.  Has been almost 2 years without smoking.  Not on any LABA's at this time.

## 2020-04-09 NOTE — Assessment & Plan Note (Signed)
Improved some, he reports working on diet to help this. Would like to loose more weight if able.   Daniel Chung is encouraged to focus on exercise daily to help with weight management. A minumum of 30 minutes daily is recommended. Additionally, importance of healthy food choices with portion control discussed.   Wt Readings from Last 3 Encounters:  04/09/20 197 lb (89.4 kg)  01/08/20 202 lb 6.4 oz (91.8 kg)  12/12/19 203 lb (92.1 kg)

## 2020-04-09 NOTE — Assessment & Plan Note (Signed)
Continue current medications as we discussed.  He is completely off of his Toprol at this time.  Denies having any trouble with this no palpitations shortness of breath, headaches or dizziness.

## 2020-04-09 NOTE — Patient Instructions (Signed)
I appreciate the opportunity to provide you with care for your health and wellness. Today we discussed: overall health  Follow up: 4 months (please call after the 20th as he is out of town)  No labs or referrals today  Continue all medications as ordered.  Please continue to practice social distancing to keep you, your family, and our community safe.  If you must go out, please wear a mask and practice good handwashing.  It was a pleasure to see you and I look forward to continuing to work together on your health and well-being. Please do not hesitate to call the office if you need care or have questions about your care.  Have a wonderful day and week. With Gratitude, Cherly Beach, DNP, AGNP-BC

## 2020-04-09 NOTE — Progress Notes (Signed)
Virtual Visit via Telephone Note   This visit type was conducted due to national recommendations for restrictions regarding the COVID-19 Pandemic (e.g. social distancing) in an effort to limit this patient's exposure and mitigate transmission in our community.  Due to his co-morbid illnesses, this patient is at least at moderate risk for complications without adequate follow up.  This format is felt to be most appropriate for this patient at this time.  The patient did not have access to video technology/had technical difficulties with video requiring transitioning to audio format only (telephone).  All issues noted in this document were discussed and addressed.  No physical exam could be performed with this format.   Evaluation Performed:  Follow-up visit  Date:  04/09/2020   ID:  Daniel Chung, DOB 1956-06-17, MRN [id number]  Patient Location: Home Provider Location: Office/Clinic  Location of Patient: Home Location of Provider: Telehealth Consent was obtain for visit to be over via telehealth. I verified that I am speaking with the correct person using two identifiers.  PCP:  Perlie Mayo, NP   Chief Complaint: Blood pressure follow-up  History of Present Illness:    Daniel Chung is a 64 y.o. male with history of GERD, emphysema/COPD/hyperlipidemia, hypertension.  In today for 5-month follow-up as we did some medication adjustment changes.   We had reduced his metoprolol to 100 mg twice daily at his last appointment it was reduced to 100 mg once daily.  Further reduction to tapering for over a month as he had been on it for a duration of time.  And to make sure he did not develop any palpitations.  He reports today that he has not had any since July 30.  He denies having any  He denies having any chest pain, shortness of breath, leg swelling, palpitations, changes in vision, dizziness or headaches.  Overall he reports he is doing well.  Would like to continue to lose some weight but  he reports that that is not happening very quickly but he has no other issues or concerns to discuss.  Of note he is currently on his way to [place] to visit some family.  The patient does not have symptoms concerning for COVID-19 infection (fever, chills, cough, or new shortness of breath).   Past Medical, Surgical, Social History, Allergies, and Medications have been Reviewed.  Past Medical History:  Diagnosis Date  . Emphysema lung ([place])   . GERD (gastroesophageal reflux disease)   . Hyperlipidemia   . Hypertension   . Peripheral neuropathy   . Tinnitus    Past Surgical History:  Procedure Laterality Date  . ELBOW SURGERY Left 1991   for tennis elbow  . FEMORAL-POPLITEAL BYPASS GRAFT Left 2011   [place], [place]  . TONSILLECTOMY  age 64  . VASECTOMY       Current Meds  Medication Sig  . amLODipine (NORVASC) 10 MG tablet Take 1 tablet (10 mg total) by mouth daily.  Marland Kitchen aspirin 81 MG chewable tablet Chew 81 mg by mouth daily.  Marland Kitchen atorvastatin (LIPITOR) 20 MG tablet Take 1 tablet (20 mg total) by mouth daily.  . Calcium Carbonate Antacid (TUMS CHEWY BITES PO) Take 1 tablet by mouth daily.  Marland Kitchen lisinopril (ZESTRIL) 40 MG tablet Take 1 tablet (40 mg total) by mouth daily.  . Omega-3 Fatty Acids (FISH OIL) 1000 MG CAPS Take 1 capsule by mouth daily.   Marland Kitchen PROVENTIL HFA 108 (90 Base) MCG/ACT inhaler INHALE 2 PUFFS BY MOUTH EVERY  6 HOURS AS NEEDED FOR WHEEZING OR SHORTNESS OF BREATH     Allergies:   Patient has no known allergies.   ROS:   Please see the history of present illness.    All other systems reviewed and are negative.   Labs/Other Tests and Data Reviewed:    Recent Labs: 12/25/2019: Hemoglobin 16.1; Platelets 213 04/01/2020: ALT 19; BUN 17; Creat 0.94; Potassium 4.5; Sodium 135   Recent Lipid Panel Lab Results  Component Value Date/Time   CHOL 164 04/01/2020 08:54 AM   TRIG 190 (H) 04/01/2020 08:54 AM   HDL 29 (L) 04/01/2020 08:54 AM   CHOLHDL 5.7 (H) 04/01/2020  08:54 AM   LDLCALC 104 (H) 04/01/2020 08:54 AM    Wt Readings from Last 3 Encounters:  04/09/20 202 lb (91.6 kg)  01/08/20 202 lb 6.4 oz (91.8 kg)  12/12/19 203 lb (92.1 kg)     Objective:    Vital Signs:  BP 115/79   Ht 6' (1.829 m)   Wt 202 lb (91.6 kg)   BMI 27.40 kg/m    VITAL SIGNS:  reviewed GEN:  alert and oriented  RESPIRATORY:  no shortness of breath in conversation  PSYCH:  normal affect and mood   ASSESSMENT & PLAN:    1. Essential (primary) hypertension   2. Chronic obstructive pulmonary disease, unspecified COPD type ([place])   3. Overweight with body mass index (BMI) of 28 to 28.9 in adult   Time:   Today, I have spent 10 minutes with the patient with telehealth technology discussing the above problems.     Medication Adjustments/Labs and Tests Ordered: Current medicines are reviewed at length with the patient today.  Concerns regarding medicines are outlined above.   Tests Ordered: No orders of the defined types were placed in this encounter.   Medication Changes: No orders of the defined types were placed in this encounter.   Disposition:  Follow up 4 months  Signed, Perlie Mayo, NP  04/09/2020 9:51 AM     [facility]

## 2020-05-28 ENCOUNTER — Other Ambulatory Visit: Payer: Self-pay | Admitting: Family Medicine

## 2020-05-28 DIAGNOSIS — E785 Hyperlipidemia, unspecified: Secondary | ICD-10-CM

## 2020-06-03 ENCOUNTER — Other Ambulatory Visit: Payer: Self-pay

## 2020-06-03 DIAGNOSIS — E785 Hyperlipidemia, unspecified: Secondary | ICD-10-CM

## 2020-06-03 MED ORDER — ATORVASTATIN CALCIUM 20 MG PO TABS: 20.0000 mg | ORAL_TABLET | Freq: Every day | ORAL | 0 refills | Status: DC

## 2020-07-09 ENCOUNTER — Other Ambulatory Visit: Payer: Self-pay | Admitting: Family Medicine

## 2020-07-09 DIAGNOSIS — I10 Essential (primary) hypertension: Secondary | ICD-10-CM

## 2020-07-15 ENCOUNTER — Other Ambulatory Visit: Payer: Self-pay

## 2020-07-15 DIAGNOSIS — I10 Essential (primary) hypertension: Secondary | ICD-10-CM

## 2020-07-15 MED ORDER — LISINOPRIL 40 MG PO TABS: 40.0000 mg | ORAL_TABLET | Freq: Every day | ORAL | 1 refills | Status: DC

## 2020-08-12 ENCOUNTER — Other Ambulatory Visit: Payer: Self-pay

## 2020-08-12 ENCOUNTER — Encounter: Payer: Self-pay | Admitting: Family Medicine

## 2020-08-12 ENCOUNTER — Telehealth (INDEPENDENT_AMBULATORY_CARE_PROVIDER_SITE_OTHER): Payer: Medicare Other | Admitting: Family Medicine

## 2020-08-12 VITALS — Ht 71.0 in | Wt 196.0 lb

## 2020-08-12 DIAGNOSIS — I10 Essential (primary) hypertension: Secondary | ICD-10-CM

## 2020-08-12 DIAGNOSIS — J449 Chronic obstructive pulmonary disease, unspecified: Secondary | ICD-10-CM | POA: Diagnosis not present

## 2020-08-12 DIAGNOSIS — E785 Hyperlipidemia, unspecified: Secondary | ICD-10-CM

## 2020-08-12 MED ORDER — ATORVASTATIN CALCIUM 20 MG PO TABS: 20.0000 mg | ORAL_TABLET | Freq: Every day | ORAL | 1 refills | Status: DC

## 2020-08-12 MED ORDER — AMLODIPINE BESYLATE 10 MG PO TABS: 10.0000 mg | ORAL_TABLET | Freq: Every day | ORAL | 1 refills | Status: DC

## 2020-08-12 NOTE — Progress Notes (Signed)
Virtual Visit via Telephone Note   This visit type was conducted due to national recommendations for restrictions regarding the COVID-19 Pandemic (e.g. social distancing) in an effort to limit this patient's exposure and mitigate transmission in our community.  Due to his co-morbid illnesses, this patient is at least at moderate risk for complications without adequate follow up.  This format is felt to be most appropriate for this patient at this time.  The patient did not have access to video technology/had technical difficulties with video requiring transitioning to audio format only (telephone).  All issues noted in this document were discussed and addressed.  No physical exam could be performed with this format.   Evaluation Performed:  Follow-up visit  Date:  08/12/2020   ID:  Daniel Chung, DOB 09/01/1955, MRN [id number]  Patient Location: Home Provider Location: Office/Clinic   Participants: Nurse for intake and work up; Patient and Provider for Visit and Wrap up  Method of visit: Telephone  Location of Patient: Home Location of Provider: Office Consent was obtain for visit over the telephone. Services rendered by provider: Visit was performed via telephone.  I verified that I am speaking with the correct person using two identifiers.  PCP:  Perlie Mayo, NP   Chief Complaint: Hypertension  History of Present Illness:    Daniel Chung is a 65 y.o. male with history as stated below Reports today for follow-up on blood pressure.  He reports his blood pressure usually ranges in the 110's over the 60s.  He denies having any chest pain, headaches, leg swelling, palpitations, blurred vision, falls or any other signs or symptoms of possible uncontrolled blood pressure.  Reports taking his medication as directed and without any issue.  The patient does not have symptoms concerning for COVID-19 infection (fever, chills, cough, or new shortness of breath).   Past Medical,  Surgical, Social History, Allergies, and Medications have been Reviewed.  Past Medical History:  Diagnosis Date  . Emphysema lung ([place])   . GERD (gastroesophageal reflux disease)   . Hyperlipidemia   . Hypertension   . Peripheral neuropathy   . Tinnitus    Past Surgical History:  Procedure Laterality Date  . ELBOW SURGERY Left 1991   for tennis elbow  . FEMORAL-POPLITEAL BYPASS GRAFT Left 2011   [place], [place]  . TONSILLECTOMY  age 65  . VASECTOMY       Current Meds  Medication Sig  . amLODipine (NORVASC) 10 MG tablet Take 1 tablet (10 mg total) by mouth daily.  Marland Kitchen aspirin 81 MG chewable tablet Chew 81 mg by mouth daily.  Marland Kitchen atorvastatin (LIPITOR) 20 MG tablet Take 1 tablet (20 mg total) by mouth daily.  . Calcium Carbonate Antacid (TUMS CHEWY BITES PO) Take 1 tablet by mouth daily.  Marland Kitchen lisinopril (ZESTRIL) 40 MG tablet Take 1 tablet (40 mg total) by mouth daily.  . Omega-3 Fatty Acids (FISH OIL) 1000 MG CAPS Take 1 capsule by mouth daily.   Marland Kitchen omeprazole (PRILOSEC) 20 MG capsule Take 20 mg by mouth daily.  Marland Kitchen PROVENTIL HFA 108 (90 Base) MCG/ACT inhaler INHALE 2 PUFFS BY MOUTH EVERY 6 HOURS AS NEEDED FOR WHEEZING OR SHORTNESS OF BREATH     Allergies:   Patient has no known allergies.   ROS:   Please see the history of present illness.    All other systems reviewed and are negative.   Labs/Other Tests and Data Reviewed:    Recent Labs: 12/25/2019: Hemoglobin 16.1; Platelets  213 04/01/2020: ALT 19; BUN 17; Creat 0.94; Potassium 4.5; Sodium 135   Recent Lipid Panel Lab Results  Component Value Date/Time   CHOL 164 04/01/2020 08:54 AM   TRIG 190 (H) 04/01/2020 08:54 AM   HDL 29 (L) 04/01/2020 08:54 AM   CHOLHDL 5.7 (H) 04/01/2020 08:54 AM   LDLCALC 104 (H) 04/01/2020 08:54 AM    Wt Readings from Last 3 Encounters:  08/12/20 196 lb (88.9 kg)  04/09/20 197 lb (89.4 kg)  01/08/20 202 lb 6.4 oz (91.8 kg)     Objective:    Vital Signs:  Ht 5\' 11"  (1.803 m)   Wt 196 lb  (88.9 kg)   BMI 27.34 kg/m    VITAL SIGNS:  reviewed GEN:  no acute distress RESPIRATORY:  No shortness of breath noted in conversation PSYCH:  normal affect  ASSESSMENT & PLAN:     1. Essential (primary) hypertension - amLODipine (NORVASC) 10 MG tablet; Take 1 tablet (10 mg total) by mouth daily.  Dispense: 90 tablet; Refill: 1   2. Chronic obstructive pulmonary disease, unspecified COPD type ([place])  3. Hyperlipidemia, unspecified hyperlipidemia type - atorvastatin (LIPITOR) 20 MG tablet; Take 1 tablet (20 mg total) by mouth daily.  Dispense: 90 tablet; Refill: 1     Time:   Today, I have spent 10 minutes with the patient with telehealth technology discussing the above problems.     Medication Adjustments/Labs and Tests Ordered: Current medicines are reviewed at length with the patient today.  Concerns regarding medicines are outlined above.   Tests Ordered: No orders of the defined types were placed in this encounter.   Medication Changes: No orders of the defined types were placed in this encounter.    Disposition:  Follow up July Signed, Perlie Mayo, NP  08/12/2020 10:30 AM     [facility]

## 2020-08-12 NOTE — Assessment & Plan Note (Signed)
Well-controlled continue all current medications as ordered.  Encourage DASH diet and 30 to 60 minutes of exercise on most days of the week.

## 2020-08-12 NOTE — Assessment & Plan Note (Signed)
Heart healthy diet encouraged.  Refill of Lipitor provided.  We will get recheck in July with labs.

## 2020-08-12 NOTE — Assessment & Plan Note (Signed)
Continues to remain well controlled.  No need for refill of albuterol inhaler.  He uses this intermittently as needed.  Not on a LABA at this time.  Has been 2 years without smoking

## 2020-08-12 NOTE — Patient Instructions (Addendum)
  I appreciate the opportunity to provide you with care for your health and wellness.  Follow up: July for BP  No labs or referrals today  Call if you need [place] sooner!  Keep up the walking :)  Please continue to practice social distancing to keep you, your family, and our community safe.  If you must go out, please wear a mask and practice good handwashing.  It was a pleasure to see you and I look forward to continuing to work together on your health and well-being. Please do not hesitate to call the office if you need care or have questions about your care.  Have a wonderful day. With Gratitude, Cherly Beach, DNP, AGNP-BC

## 2020-11-18 NOTE — Progress Notes (Signed)
Subjective:   Bronc Brosseau is a 65 y.o. male who presents for an Initial Medicare Annual Wellness Visit.  I connected with [name]  today by telephone and verified that I am speaking with the correct person using two identifiers. Location patient: home Location provider: work Persons participating in the virtual visit: patient, provider.   I discussed the limitations, risks, security and privacy concerns of performing an evaluation and management service by telephone and the availability of in person appointments. I also discussed with the patient that there may be a patient responsible charge related to this service. The patient expressed understanding and verbally consented to this telephonic visit.    Interactive audio and video telecommunications were attempted between this provider and patient, however failed, due to patient having technical difficulties OR patient did not have access to video capability.  We continued and completed visit with audio only.      Review of Systems    N/A  Cardiac Risk Factors include: male gender;advanced age (>64men, >41 women);hypertension;dyslipidemia     Objective:    Today's Vitals   There is no height or weight on file to calculate BMI.  Advanced Directives 11/19/2020  Does Patient Have a Medical Advance Directive? No  Would patient like information on creating a medical advance directive? No - Patient declined    Current Medications (verified) Outpatient Encounter Medications as of 11/19/2020  Medication Sig  . amLODipine (NORVASC) 10 MG tablet Take 1 tablet (10 mg total) by mouth daily.  Marland Kitchen aspirin 81 MG chewable tablet Chew 81 mg by mouth daily.  Marland Kitchen atorvastatin (LIPITOR) 20 MG tablet Take 1 tablet (20 mg total) by mouth daily.  . Calcium Carbonate Antacid (TUMS CHEWY BITES PO) Take 1 tablet by mouth daily.  Marland Kitchen lisinopril (ZESTRIL) 40 MG tablet Take 1 tablet (40 mg total) by mouth daily.  . Omega-3 Fatty Acids (FISH OIL) 1000 MG  CAPS Take 1 capsule by mouth daily.   Marland Kitchen omeprazole (PRILOSEC) 20 MG capsule Take 20 mg by mouth daily.  Marland Kitchen PROVENTIL HFA 108 (90 Base) MCG/ACT inhaler INHALE 2 PUFFS BY MOUTH EVERY 6 HOURS AS NEEDED FOR WHEEZING OR SHORTNESS OF BREATH   No facility-administered encounter medications on file as of 11/19/2020.    Allergies (verified) Patient has no known allergies.   History: Past Medical History:  Diagnosis Date  . Emphysema lung ([place])   . GERD (gastroesophageal reflux disease)   . Hyperlipidemia   . Hypertension   . Peripheral neuropathy   . Tinnitus    Past Surgical History:  Procedure Laterality Date  . ELBOW SURGERY Left 1991   for tennis elbow  . FEMORAL-POPLITEAL BYPASS GRAFT Left 2011   [place], [place]  . TONSILLECTOMY  age 32  . VASECTOMY     Family History  Problem Relation Age of Onset  . Scleroderma Mother   . Diabetes Father   . Hypertension Father   . Cancer Sister   . COPD Sister   . Cancer Brother    Social History   Socioeconomic History  . Marital status: Divorced    Spouse name: Not on file  . Number of children: 3  . Years of education: Not on file  . Highest education level: Associate degree: occupational, Hotel manager, or vocational program  Occupational History    Comment: Retired   Tobacco Use  . Smoking status: Former Smoker    Packs/day: 1.00    Years: 50.00    Pack years: 50.00  Types: Cigarettes    Quit date: 03/07/2018    Years since quitting: 2.7  . Smokeless tobacco: Never Used  Vaping Use  . Vaping Use: Never used  Substance and Sexual Activity  . Alcohol use: Yes    Comment: rare social  . Drug use: Yes    Types: Marijuana, PCP    Comment: none since his 20's  . Sexual activity: Yes  Other Topics Concern  . Not on file  Social History Narrative   Lives alone   3 children: 2 in [place], 1 in [place]      Enjoys: casino, auto racing-short tracks      Diet: lunch is biggest meal, eats all food groups   Caffeine: none really     Water: 4 16 oz bottles      Wears seat belt   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    Social Determinants of Health   Financial Resource Strain: Low Risk   . Difficulty of Paying Living Expenses: Not hard at all  Food Insecurity: No Food Insecurity  . Worried About Charity fundraiser in the Last Year: Never true  . Ran Out of Food in the Last Year: Never true  Transportation Needs: No Transportation Needs  . Lack of Transportation (Medical): No  . Lack of Transportation (Non-Medical): No  Physical Activity: Insufficiently Active  . Days of Exercise per Week: 7 days  . Minutes of Exercise per Session: 20 min  Stress: No Stress Concern Present  . Feeling of Stress : Not at all  Social Connections: Socially Isolated  . Frequency of Communication with Friends and Family: More than three times a week  . Frequency of Social Gatherings with Friends and Family: Once a week  . Attends Religious Services: Never  . Active Member of Clubs or Organizations: No  . Attends Archivist Meetings: Never  . Marital Status: Divorced    Tobacco Counseling Counseling given: Not Answered   Clinical Intake:  Pre-visit preparation completed: Yes  Pain : No/denies pain     Nutritional Risks: Nausea/ vomitting/ diarrhea (nausea) Diabetes: No  How often do you need to have someone help you when you read instructions, pamphlets, or other written materials from your doctor or pharmacy?: 1 - Never What is the last grade level you completed in school?: College  Diabetic?No   Interpreter Needed?: No  Information entered by :: [place] of Daily Living In your present state of health, do you have any difficulty performing the following activities: 11/19/2020 12/12/2019  Hearing? Y N  Comment has tinnitus -  Vision? N N  Difficulty concentrating or making decisions? N N  Walking or climbing stairs? N N  Dressing or bathing? N N  Doing errands,  shopping? N N  Preparing Food and eating ? N -  Using the Toilet? N -  In the past six months, have you accidently leaked urine? N -  Do you have problems with loss of bowel control? N -  Managing your Medications? N -  Managing your Finances? N -  Housekeeping or managing your Housekeeping? N -  Some recent data might be hidden    Patient Care Team: Perlie Mayo, NP as PCP - General (Family Medicine)  Indicate any recent Medical Services you may have received from other than [facility] providers in the past year (date may be approximate).     Assessment:   This is a routine wellness  examination for [facility].  Hearing/Vision screen  Hearing Screening   125Hz  250Hz  500Hz  1000Hz  2000Hz  3000Hz  4000Hz  6000Hz  8000Hz   Right ear:           Left ear:           Vision Screening Comments: Has not had eye exam in several years. Has hx of Lasik surgery in 2001. Wears glasses only for reading   Dietary issues and exercise activities discussed: Current Exercise Habits: Home exercise routine, Type of exercise: walking, Time (Minutes): 20, Frequency (Times/Week): 7, Weekly Exercise (Minutes/Week): 140, Intensity: Mild  Goals    . Weight (lb) < 180 lb (81.6 kg)      Depression Screen PHQ 2/9 Scores 11/19/2020 08/12/2020 04/09/2020 01/08/2020 12/12/2019  PHQ - 2 Score 0 0 0 0 0  PHQ- 9 Score - - - 0 -    Fall Risk Fall Risk  11/19/2020 08/12/2020 04/09/2020 01/08/2020 12/12/2019  Falls in the past year? 0 0 0 0 1  Number falls in past yr: 0 0 0 0 0  Injury with Fall? 0 0 0 0 0  Risk for fall due to : No Fall Risks No Fall Risks No Fall Risks No Fall Risks -  Follow up Falls evaluation completed;Falls prevention discussed Falls evaluation completed Falls evaluation completed Falls evaluation completed -    FALL RISK PREVENTION PERTAINING TO THE HOME:  Any stairs in or around the home? Yes  If so, are there any without handrails? No  Home free of loose throw rugs in walkways, pet beds, electrical cords,  etc? Yes  Adequate lighting in your home to reduce risk of falls? Yes   ASSISTIVE DEVICES UTILIZED TO PREVENT FALLS:  Life alert? No  Use of a cane, walker or w/c? No  Grab bars in the bathroom? No  Shower chair or bench in shower? No  Elevated toilet seat or a handicapped toilet? Yes     Cognitive Function:     Normal cognitive status assessed by direct observation by this Nurse Health Advisor. No abnormalities found.      Immunizations  There is no immunization history on file for this patient.  TDAP status: Due, Education has been provided regarding the importance of this vaccine. Advised may receive this vaccine at local pharmacy or Health Dept. Aware to provide a copy of the vaccination record if obtained from local pharmacy or Health Dept. Verbalized acceptance and understanding.  Flu Vaccine status: Due, Education has been provided regarding the importance of this vaccine. Advised may receive this vaccine at local pharmacy or Health Dept. Aware to provide a copy of the vaccination record if obtained from local pharmacy or Health Dept. Verbalized acceptance and understanding.  Pneumococcal vaccine status: Due, Education has been provided regarding the importance of this vaccine. Advised may receive this vaccine at local pharmacy or Health Dept. Aware to provide a copy of the vaccination record if obtained from local pharmacy or Health Dept. Verbalized acceptance and understanding.  Covid-19 vaccine status: Declined, Education has been provided regarding the importance of this vaccine but patient still declined. Advised may receive this vaccine at local pharmacy or Health Dept.or vaccine clinic. Aware to provide a copy of the vaccination record if obtained from local pharmacy or Health Dept. Verbalized acceptance and understanding.  Qualifies for Shingles Vaccine? Yes   Zostavax completed No   Shingrix Completed?: No.    Education has been provided regarding the importance of  this vaccine. Patient has been advised to call insurance  company to determine out of pocket expense if they have not yet received this vaccine. Advised may also receive vaccine at local pharmacy or Health Dept. Verbalized acceptance and understanding.  Screening Tests Health Maintenance  Topic Date Due  . Hepatitis C Screening  Never done  . COVID-19 Vaccine (1) Never done  . TETANUS/TDAP  Never done  . COLONOSCOPY (Pts 45-62yrs Insurance coverage will need to be confirmed)  Never done  . PNA vac Low Risk Adult (1 of 2 - PCV13) 12/11/2020 (Originally 10/18/2019)  . INFLUENZA VACCINE  03/02/2021  . HPV VACCINES  Aged Out    Health Maintenance  Health Maintenance Due  Topic Date Due  . Hepatitis C Screening  Never done  . COVID-19 Vaccine (1) Never done  . TETANUS/TDAP  Never done  . COLONOSCOPY (Pts 45-26yrs Insurance coverage will need to be confirmed)  Never done    Colorectal Cancer Screening: No longer required 3  Lung Cancer Screening: (Low Dose CT Chest recommended if Age 36-80 years, 30 pack-year currently smoking OR have quit w/in 15years.) does qualify.   Lung Cancer Screening Referral: No   Additional Screening:  Hepatitis C Screening: does qualify;   Vision Screening: Recommended annual ophthalmology exams for early detection of glaucoma and other disorders of the eye. Is the patient up to date with their annual eye exam?  No  Who is the provider or what is the name of the office in which the patient attends annual eye exams? Does not have an eye exam  If pt is not established with a provider, would they like to be referred to a provider to establish care? Yes .   Dental Screening: Recommended annual dental exams for proper oral hygiene  Community Resource Referral / Chronic Care Management: CRR required this visit?  No   CCM required this visit?  No      Plan:     I have personally reviewed and noted the following in the patient's chart:   . Medical and  social history . Use of alcohol, tobacco or illicit drugs  . Current medications and supplements . Functional ability and status . Nutritional status . Physical activity . Advanced directives . List of other physicians . Hospitalizations, surgeries, and ER visits in previous 12 months . Vitals . Screenings to include cognitive, depression, and falls . Referrals and appointments  In addition, I have reviewed and discussed with patient certain preventive protocols, quality metrics, and best practice recommendations. A written personalized care plan for preventive services as well as general preventive health recommendations were provided to patient.     Ofilia Neas, LPN   [id number]/[id number]   Nurse Notes: None

## 2020-11-19 ENCOUNTER — Ambulatory Visit (INDEPENDENT_AMBULATORY_CARE_PROVIDER_SITE_OTHER): Payer: Medicare Other

## 2020-11-19 DIAGNOSIS — Z0000 Encounter for general adult medical examination without abnormal findings: Secondary | ICD-10-CM | POA: Diagnosis not present

## 2020-11-19 DIAGNOSIS — Z0100 Encounter for examination of eyes and vision without abnormal findings: Secondary | ICD-10-CM | POA: Diagnosis not present

## 2020-11-19 NOTE — Patient Instructions (Signed)
[name] , Thank you for taking time to come for your Medicare Wellness Visit. I appreciate your ongoing commitment to your health goals. Please review the following plan we discussed and let me know if I can assist you in the future.   Screening recommendations/referrals: Colonoscopy: Currently due, please let us know when you are ready for [place] to get you set up. Recommended yearly ophthalmology/optometry visit for glaucoma screening and checkup Recommended yearly dental visit for hygiene and checkup  Vaccinations: Influenza vaccine: Patient declined  Pneumococcal vaccine: Currently due, you may receive at your next office visit. Tdap vaccine: Currently due, you may await an injury to receive  Shingles vaccine: Currently due for Shingrix, if you would like to receive we recommend that you do so at your local pharmacy.    Advanced directives: Advance directive discussed with you today. Even though you declined this today please call our office should you change your mind and we can give you the proper paperwork for you to fill out.   Conditions/risks identified: none   Next appointment: 02/11/2021   Preventive Care 65 Years and Older, Male Preventive care refers to lifestyle choices and visits with your health care provider that can promote health and wellness. What does preventive care include?  A yearly physical exam. This is also called an annual well check.  Dental exams once or twice a year.  Routine eye exams. Ask your health care provider how often you should have your eyes checked.  Personal lifestyle choices, including:  Daily care of your teeth and gums.  Regular physical activity.  Eating a healthy diet.  Avoiding tobacco and drug use.  Limiting alcohol use.  Practicing safe sex.  Taking low doses of aspirin every day.  Taking vitamin and mineral supplements as recommended by your health care provider. What happens during an annual well check? The services  and screenings done by your health care provider during your annual well check will depend on your age, overall health, lifestyle risk factors, and family history of disease. Counseling  Your health care provider may ask you questions about your:  Alcohol use.  Tobacco use.  Drug use.  Emotional well-being.  Home and relationship well-being.  Sexual activity.  Eating habits.  History of falls.  Memory and ability to understand (cognition).  Work and work Statistician. Screening  You may have the following tests or measurements:  Height, weight, and BMI.  Blood pressure.  Lipid and cholesterol levels. These may be checked every 5 years, or more frequently if you are over 58 years old.  Skin check.  Lung cancer screening. You may have this screening every year starting at age 28 if you have a 30-pack-year history of smoking and currently smoke or have quit within the past 15 years.  Fecal occult blood test (FOBT) of the stool. You may have this test every year starting at age 92.  Flexible sigmoidoscopy or colonoscopy. You may have a sigmoidoscopy every 5 years or a colonoscopy every 10 years starting at age 41.  Prostate cancer screening. Recommendations will vary depending on your family history and other risks.  Hepatitis C blood test.  Hepatitis B blood test.  Sexually transmitted disease (STD) testing.  Diabetes screening. This is done by checking your blood sugar (glucose) after you have not eaten for a while (fasting). You may have this done every 1-3 years.  Abdominal aortic aneurysm (AAA) screening. You may need this if you are a current or former smoker.  Osteoporosis. You may be screened starting at age 36 if you are at high risk. Talk with your health care provider about your test results, treatment options, and if necessary, the need for more tests. Vaccines  Your health care provider may recommend certain vaccines, such as:  Influenza vaccine. This  is recommended every year.  Tetanus, diphtheria, and acellular pertussis (Tdap, Td) vaccine. You may need a Td booster every 10 years.  Zoster vaccine. You may need this after age 59.  Pneumococcal 13-valent conjugate (PCV13) vaccine. One dose is recommended after age 7.  Pneumococcal polysaccharide (PPSV23) vaccine. One dose is recommended after age 101. Talk to your health care provider about which screenings and vaccines you need and how often you need them. This information is not intended to replace advice given to you by your health care provider. Make sure you discuss any questions you have with your health care provider. Document Released: 08/15/2015 Document Revised: 04/07/2016 Document Reviewed: 05/20/2015 Elsevier Interactive Patient Education  2017 [place] Prevention in the Home Falls can cause injuries. They can happen to people of all ages. There are many things you can do to make your home safe and to help prevent falls. What can I do on the outside of my home?  Regularly fix the edges of walkways and driveways and fix any cracks.  Remove anything that might make you trip as you walk through a door, such as a raised step or threshold.  Trim any bushes or trees on the path to your home.  Use bright outdoor lighting.  Clear any walking paths of anything that might make someone trip, such as rocks or tools.  Regularly check to see if handrails are loose or broken. Make sure that both sides of any steps have handrails.  Any raised decks and porches should have guardrails on the edges.  Have any leaves, snow, or ice cleared regularly.  Use sand or salt on walking paths during winter.  Clean up any spills in your garage right away. This includes oil or grease spills. What can I do in the bathroom?  Use night lights.  Install grab bars by the toilet and in the tub and shower. Do not use towel bars as grab bars.  Use non-skid mats or decals in the tub or  shower.  If you need to sit down in the shower, use a plastic, non-slip stool.  Keep the floor dry. Clean up any water that spills on the floor as soon as it happens.  Remove soap buildup in the tub or shower regularly.  Attach bath mats securely with double-sided non-slip rug tape.  Do not have throw rugs and other things on the floor that can make you trip. What can I do in the bedroom?  Use night lights.  Make sure that you have a light by your bed that is easy to reach.  Do not use any sheets or blankets that are too big for your bed. They should not hang down onto the floor.  Have a firm chair that has side arms. You can use this for support while you get dressed.  Do not have throw rugs and other things on the floor that can make you trip. What can I do in the kitchen?  Clean up any spills right away.  Avoid walking on wet floors.  Keep items that you use a lot in easy-to-reach places.  If you need to reach something above you, use a strong step  stool that has a grab bar.  Keep electrical cords out of the way.  Do not use floor polish or wax that makes floors slippery. If you must use wax, use non-skid floor wax.  Do not have throw rugs and other things on the floor that can make you trip. What can I do with my stairs?  Do not leave any items on the stairs.  Make sure that there are handrails on both sides of the stairs and use them. Fix handrails that are broken or loose. Make sure that handrails are as long as the stairways.  Check any carpeting to make sure that it is firmly attached to the stairs. Fix any carpet that is loose or worn.  Avoid having throw rugs at the top or bottom of the stairs. If you do have throw rugs, attach them to the floor with carpet tape.  Make sure that you have a light switch at the top of the stairs and the bottom of the stairs. If you do not have them, ask someone to add them for you. What else can I do to help prevent  falls?  Wear shoes that:  Do not have high heels.  Have rubber bottoms.  Are comfortable and fit you well.  Are closed at the toe. Do not wear sandals.  If you use a stepladder:  Make sure that it is fully opened. Do not climb a closed stepladder.  Make sure that both sides of the stepladder are locked into place.  Ask someone to hold it for you, if possible.  Clearly mark and make sure that you can see:  Any grab bars or handrails.  First and last steps.  Where the edge of each step is.  Use tools that help you move around (mobility aids) if they are needed. These include:  Canes.  Walkers.  Scooters.  Crutches.  Turn on the lights when you go into a dark area. Replace any light bulbs as soon as they burn out.  Set up your furniture so you have a clear path. Avoid moving your furniture around.  If any of your floors are uneven, fix them.  If there are any pets around you, be aware of where they are.  Review your medicines with your doctor. Some medicines can make you feel dizzy. This can increase your chance of falling. Ask your doctor what other things that you can do to help prevent falls. This information is not intended to replace advice given to you by your health care provider. Make sure you discuss any questions you have with your health care provider. Document Released: 05/15/2009 Document Revised: 12/25/2015 Document Reviewed: 08/23/2014 Elsevier Interactive Patient Education  2017 [employer].

## 2021-01-12 ENCOUNTER — Ambulatory Visit (INDEPENDENT_AMBULATORY_CARE_PROVIDER_SITE_OTHER): Payer: Medicare Other | Admitting: Internal Medicine

## 2021-01-12 ENCOUNTER — Encounter: Payer: Self-pay | Admitting: Internal Medicine

## 2021-01-12 ENCOUNTER — Other Ambulatory Visit: Payer: Self-pay

## 2021-01-12 VITALS — BP 122/73 | HR 85 | Temp 98.9°F | Resp 18 | Ht 71.0 in | Wt 191.0 lb

## 2021-01-12 DIAGNOSIS — E785 Hyperlipidemia, unspecified: Secondary | ICD-10-CM

## 2021-01-12 DIAGNOSIS — K219 Gastro-esophageal reflux disease without esophagitis: Secondary | ICD-10-CM | POA: Diagnosis not present

## 2021-01-12 DIAGNOSIS — Z1329 Encounter for screening for other suspected endocrine disorder: Secondary | ICD-10-CM

## 2021-01-12 DIAGNOSIS — I10 Essential (primary) hypertension: Secondary | ICD-10-CM | POA: Diagnosis not present

## 2021-01-12 DIAGNOSIS — R7303 Prediabetes: Secondary | ICD-10-CM | POA: Diagnosis not present

## 2021-01-12 DIAGNOSIS — Z1159 Encounter for screening for other viral diseases: Secondary | ICD-10-CM

## 2021-01-12 NOTE — Patient Instructions (Signed)
Please continue taking medications as prescribed.  Please continue to follow low salt diet and perform moderate exercise/walking at least 150 mins/week.  We recommend you to take COVID vaccine and routine age-appropriate vaccines - Pneumonia and Shingrix.

## 2021-01-12 NOTE — Assessment & Plan Note (Signed)
On Atorvastatin Check lipid profile 

## 2021-01-12 NOTE — Progress Notes (Signed)
Established Patient Office Visit  Subjective:  Patient ID: Daniel Chung, male    DOB: 06/10/1956  Age: 65 y.o. MRN: [id number]  CC:  Chief Complaint  Patient presents with  . Hypertension    Bp check also pt stopped taking aspirin 81 mg as he saw something on tv about it causing stomach problems he was having stomach problems and these stopped once he stopped taking asprin     HPI [name] presents for follow up of his chronic medical conditions.  HTN: BP is well-controlled. Takes medications regularly. Patient denies headache, dizziness, chest pain, dyspnea or palpitations.  HLD: Has been taking Atorvastatin.  GERD: On Omeprazole. States that he has stopped taking Aspirin and has been feeling better in terms of epigastric pain. Denies any h/o CVA or MI.  Past Medical History:  Diagnosis Date  . Emphysema lung ([place])   . GERD (gastroesophageal reflux disease)   . Hyperlipidemia   . Hypertension   . Peripheral neuropathy   . Tinnitus     Past Surgical History:  Procedure Laterality Date  . ELBOW SURGERY Left 1991   for tennis elbow  . FEMORAL-POPLITEAL BYPASS GRAFT Left 2011   [place], [place]  . TONSILLECTOMY  age 14  . VASECTOMY      Family History  Problem Relation Age of Onset  . Scleroderma Mother   . Diabetes Father   . Hypertension Father   . Cancer Sister   . COPD Sister   . Cancer Brother     Social History   Socioeconomic History  . Marital status: Divorced    Spouse name: Not on file  . Number of children: 3  . Years of education: Not on file  . Highest education level: Associate degree: occupational, Hotel manager, or vocational program  Occupational History    Comment: Retired   Tobacco Use  . Smoking status: Former    Packs/day: 1.00    Years: 50.00    Pack years: 50.00    Types: Cigarettes    Quit date: 03/07/2018    Years since quitting: 2.8  . Smokeless tobacco: Never  Vaping Use  . Vaping Use: Never used  Substance and Sexual  Activity  . Alcohol use: Yes    Comment: rare social  . Drug use: Yes    Types: Marijuana, PCP    Comment: none since his 20's  . Sexual activity: Yes  Other Topics Concern  . Not on file  Social History Narrative   Lives alone   3 children: 2 in [place], 1 in [place]      Enjoys: casino, auto racing-short tracks      Diet: lunch is biggest meal, eats all food groups   Caffeine: none really    Water: 4 16 oz bottles      Wears seat belt   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    Social Determinants of Health   Financial Resource Strain: Low Risk   . Difficulty of Paying Living Expenses: Not hard at all  Food Insecurity: No Food Insecurity  . Worried About Charity fundraiser in the Last Year: Never true  . Ran Out of Food in the Last Year: Never true  Transportation Needs: No Transportation Needs  . Lack of Transportation (Medical): No  . Lack of Transportation (Non-Medical): No  Physical Activity: Insufficiently Active  . Days of Exercise per Week: 7 days  . Minutes of Exercise per Session: 20 min  Stress:  No Stress Concern Present  . Feeling of Stress : Not at all  Social Connections: Socially Isolated  . Frequency of Communication with Friends and Family: More than three times a week  . Frequency of Social Gatherings with Friends and Family: Once a week  . Attends Religious Services: Never  . Active Member of Clubs or Organizations: No  . Attends Archivist Meetings: Never  . Marital Status: Divorced  Human resources officer Violence: Not At Risk  . Fear of Current or Ex-Partner: No  . Emotionally Abused: No  . Physically Abused: No  . Sexually Abused: No    Outpatient Medications Prior to Visit  Medication Sig Dispense Refill  . amLODipine (NORVASC) 10 MG tablet Take 1 tablet (10 mg total) by mouth daily. 90 tablet 1  . atorvastatin (LIPITOR) 20 MG tablet Take 1 tablet (20 mg total) by mouth daily. 90 tablet 1  . Calcium Carbonate Antacid (TUMS  CHEWY BITES PO) Take 1 tablet by mouth daily.    Marland Kitchen lisinopril (ZESTRIL) 40 MG tablet Take 1 tablet (40 mg total) by mouth daily. 90 tablet 1  . Omega-3 Fatty Acids (FISH OIL) 1000 MG CAPS Take 1 capsule by mouth daily.     Marland Kitchen omeprazole (PRILOSEC) 20 MG capsule Take 20 mg by mouth daily.    Marland Kitchen PROVENTIL HFA 108 (90 Base) MCG/ACT inhaler INHALE 2 PUFFS BY MOUTH EVERY 6 HOURS AS NEEDED FOR WHEEZING OR SHORTNESS OF BREATH 20.1 g 2  . aspirin 81 MG chewable tablet Chew 81 mg by mouth daily. (Patient not taking: Reported on 01/12/2021)     No facility-administered medications prior to visit.    No Known Allergies  ROS Review of Systems  Constitutional:  Negative for chills and fever.  HENT:  Negative for congestion and sore throat.   Eyes:  Negative for pain and discharge.  Respiratory:  Negative for cough and shortness of breath.   Cardiovascular:  Negative for chest pain and palpitations.  Gastrointestinal:  Negative for constipation, diarrhea, nausea and vomiting.  Endocrine: Negative for polydipsia and polyuria.  Genitourinary:  Negative for dysuria and hematuria.  Musculoskeletal:  Negative for neck pain and neck stiffness.  Skin:  Negative for rash.  Neurological:  Negative for dizziness, weakness, numbness and headaches.  Psychiatric/Behavioral:  Negative for agitation and behavioral problems.      Objective:    Physical Exam Vitals reviewed.  Constitutional:      General: He is not in acute distress.    Appearance: He is not diaphoretic.  HENT:     Head: Normocephalic and atraumatic.     Nose: Nose normal.     Mouth/Throat:     Mouth: Mucous membranes are moist.  Eyes:     General: No scleral icterus.    Extraocular Movements: Extraocular movements intact.     Pupils: Pupils are equal, round, and reactive to light.  Cardiovascular:     Rate and Rhythm: Normal rate and regular rhythm.     Pulses: Normal pulses.     Heart sounds: Normal heart sounds. No murmur  heard. Pulmonary:     Breath sounds: Normal breath sounds. No wheezing or rales.  Abdominal:     Palpations: Abdomen is soft.     Tenderness: There is no abdominal tenderness.  Musculoskeletal:     Cervical back: Neck supple. No tenderness.     Right lower leg: No edema.     Left lower leg: No edema.  Skin:  General: Skin is warm.     Findings: No rash.  Neurological:     General: No focal deficit present.     Mental Status: He is alert and oriented to person, place, and time.     Sensory: No sensory deficit.     Motor: No weakness.  Psychiatric:        Mood and Affect: Mood normal.        Behavior: Behavior normal.    BP 122/73 (BP Location: Right Arm, Patient Position: Sitting, Cuff Size: Normal)   Pulse 85   Temp 98.9 F (37.2 C) (Oral)   Resp 18   Ht 5' 11" (1.803 m)   Wt 191 lb (86.6 kg)   SpO2 95%   BMI 26.64 kg/m  Wt Readings from Last 3 Encounters:  01/12/21 191 lb (86.6 kg)  08/12/20 196 lb (88.9 kg)  04/09/20 197 lb (89.4 kg)     Health Maintenance Due  Topic Date Due  . COVID-19 Vaccine (1) Never done  . Hepatitis C Screening  Never done  . TETANUS/TDAP  Never done  . COLONOSCOPY (Pts 45-26yr Insurance coverage will need to be confirmed)  Never done  . Zoster Vaccines- Shingrix (1 of 2) Never done  . PNA vac Low Risk Adult (1 of 2 - PCV13) Never done    There are no preventive care reminders to display for this patient.  No results found for: TSH Lab Results  Component Value Date   WBC 8.9 12/25/2019   HGB 16.1 12/25/2019   HCT 48.2 12/25/2019   MCV 94.1 12/25/2019   PLT 213 12/25/2019   Lab Results  Component Value Date   NA 135 04/01/2020   K 4.5 04/01/2020   CO2 23 04/01/2020   GLUCOSE 115 (H) 04/01/2020   BUN 17 04/01/2020   CREATININE 0.94 04/01/2020   BILITOT 0.5 04/01/2020   ALKPHOS 90 06/07/2019   AST 15 04/01/2020   ALT 19 04/01/2020   PROT 6.8 04/01/2020   ALBUMIN 4.3 06/07/2019   CALCIUM 9.7 04/01/2020   ANIONGAP 9  06/07/2019   Lab Results  Component Value Date   CHOL 164 04/01/2020   Lab Results  Component Value Date   HDL 29 (L) 04/01/2020   Lab Results  Component Value Date   LDLCALC 104 (H) 04/01/2020   Lab Results  Component Value Date   TRIG 190 (H) 04/01/2020   Lab Results  Component Value Date   CHOLHDL 5.7 (H) 04/01/2020   Lab Results  Component Value Date   HGBA1C 6.1 (H) 12/25/2019      Assessment & Plan:   Problem List Items Addressed This Visit       Cardiovascular and Mediastinum   Essential hypertension - Primary    BP Readings from Last 1 Encounters:  01/12/21 122/73  Well-controlled with Amlodipine and Lisinopril Counseled for compliance with the medications Advised DASH diet and moderate exercise/walking, at least 150 mins/week        Relevant Orders   CBC   CMP14+EGFR   TSH     Digestive   GERD (gastroesophageal reflux disease)    On Omeprazole Was on Aspirin for primary ppx, stopped taking it - feels better with epigastric pain         Other   Hyperlipidemia    On Atorvastatin Check lipid profile       Relevant Orders   Lipid panel   Prediabetes    Lab Results  Component Value Date  HGBA1C 6.1 (H) 12/25/2019  Follow low carb diet       Relevant Orders   HgB A1c   Other Visit Diagnoses     Screening for thyroid disorder       Relevant Orders   TSH   Need for hepatitis C screening test       Relevant Orders   Hepatitis C Antibody       No orders of the defined types were placed in this encounter.   Follow-up: Return in about 4 months (around 05/14/2021).    Lindell Spar, MD

## 2021-01-12 NOTE — Assessment & Plan Note (Signed)
BP Readings from Last 1 Encounters:  01/12/21 122/73   Well-controlled with Amlodipine and Lisinopril Counseled for compliance with the medications Advised DASH diet and moderate exercise/walking, at least 150 mins/week

## 2021-01-12 NOTE — Assessment & Plan Note (Signed)
On Omeprazole Was on Aspirin for primary ppx, stopped taking it - feels better with epigastric pain

## 2021-01-12 NOTE — Assessment & Plan Note (Signed)
Lab Results  Component Value Date   HGBA1C 6.1 (H) 12/25/2019   Follow low carb diet

## 2021-02-11 ENCOUNTER — Ambulatory Visit: Payer: Medicare Other | Admitting: Internal Medicine

## 2021-02-11 ENCOUNTER — Ambulatory Visit: Payer: Medicare Other | Admitting: Family Medicine

## 2021-05-08 LAB — HEPATITIS C ANTIBODY: Hep C Virus Ab: <0.1 s/co ratio (ref 0.0–0.9)

## 2021-05-08 LAB — CMP14+EGFR
ALT: 18 IU/L (ref 0–44)
AST: 17 IU/L (ref 0–40)
Albumin/Globulin Ratio: 1.9 (ref 1.2–2.2)
Albumin: 4.5 g/dL (ref 3.8–4.8)
Alkaline Phosphatase: 113 IU/L (ref 44–121)
BUN/Creatinine Ratio: 14 (ref 10–24)
BUN: 13 mg/dL (ref 8–27)
Bilirubin Total: 0.5 mg/dL (ref 0.0–1.2)
CO2: 20 mmol/L (ref 20–29)
Calcium: 9.9 mg/dL (ref 8.6–10.2)
Chloride: 98 mmol/L (ref 96–106)
Creatinine, Ser: 0.92 mg/dL (ref 0.76–1.27)
Globulin, Total: 2.4 g/dL (ref 1.5–4.5)
Glucose: 110 mg/dL — ABNORMAL HIGH (ref 70–99)
Potassium: 4.5 mmol/L (ref 3.5–5.2)
Sodium: 133 mmol/L — ABNORMAL LOW (ref 134–144)
Total Protein: 6.9 g/dL (ref 6.0–8.5)
eGFR: 92 mL/min/{1.73_m2} (ref >59)

## 2021-05-08 LAB — CBC
Hematocrit: 48.1 % (ref 37.5–51.0)
Hemoglobin: 16.8 g/dL (ref 13.0–17.7)
MCH: 31.9 pg (ref 26.6–33.0)
MCHC: 34.9 g/dL (ref 31.5–35.7)
MCV: 91 fL (ref 79–97)
Platelets: 227 10*3/uL (ref 150–450)
RBC: 5.26 x10E6/uL (ref 4.14–5.80)
RDW: 12.8 % (ref 11.6–15.4)
WBC: 11.2 10*3/uL — ABNORMAL HIGH (ref 3.4–10.8)

## 2021-05-08 LAB — HEMOGLOBIN A1C
Est. average glucose Bld gHb Est-mCnc: 143 mg/dL
Hgb A1c MFr Bld: 6.6 % — ABNORMAL HIGH (ref 4.8–5.6)

## 2021-05-08 LAB — LIPID PANEL
Chol/HDL Ratio: 5.8 ratio — ABNORMAL HIGH (ref 0.0–5.0)
Cholesterol, Total: 180 mg/dL (ref 100–199)
HDL: 31 mg/dL — ABNORMAL LOW (ref >39)
LDL Chol Calc (NIH): 118 mg/dL — ABNORMAL HIGH (ref 0–99)
Triglycerides: 175 mg/dL — ABNORMAL HIGH (ref 0–149)
VLDL Cholesterol Cal: 31 mg/dL (ref 5–40)

## 2021-05-08 LAB — TSH: TSH: 1.63 u[IU]/mL (ref 0.450–4.500)

## 2021-05-11 ENCOUNTER — Telehealth: Payer: Self-pay | Admitting: Internal Medicine

## 2021-05-11 NOTE — Telephone Encounter (Signed)
Returning nurse phone  call

## 2021-05-14 ENCOUNTER — Ambulatory Visit (INDEPENDENT_AMBULATORY_CARE_PROVIDER_SITE_OTHER): Payer: Medicare Other | Admitting: Internal Medicine

## 2021-05-14 ENCOUNTER — Ambulatory Visit: Payer: Medicare Other | Admitting: Family Medicine

## 2021-05-14 ENCOUNTER — Other Ambulatory Visit: Payer: Self-pay

## 2021-05-14 ENCOUNTER — Encounter: Payer: Self-pay | Admitting: Internal Medicine

## 2021-05-14 ENCOUNTER — Encounter (INDEPENDENT_AMBULATORY_CARE_PROVIDER_SITE_OTHER): Payer: Self-pay

## 2021-05-14 VITALS — BP 140/82 | HR 92 | Temp 98.5°F | Ht 71.0 in | Wt 187.0 lb

## 2021-05-14 DIAGNOSIS — E782 Mixed hyperlipidemia: Secondary | ICD-10-CM

## 2021-05-14 DIAGNOSIS — K219 Gastro-esophageal reflux disease without esophagitis: Secondary | ICD-10-CM | POA: Diagnosis not present

## 2021-05-14 DIAGNOSIS — I10 Essential (primary) hypertension: Secondary | ICD-10-CM

## 2021-05-14 DIAGNOSIS — E1169 Type 2 diabetes mellitus with other specified complication: Secondary | ICD-10-CM

## 2021-05-14 MED ORDER — ATORVASTATIN CALCIUM 40 MG PO TABS: 40.0000 mg | ORAL_TABLET | Freq: Every day | ORAL | 1 refills | Status: DC

## 2021-05-14 MED ORDER — LISINOPRIL 40 MG PO TABS: 40.0000 mg | ORAL_TABLET | Freq: Every day | ORAL | 1 refills | Status: DC

## 2021-05-14 MED ORDER — AMLODIPINE BESYLATE 10 MG PO TABS: 10.0000 mg | ORAL_TABLET | Freq: Every day | ORAL | 1 refills | Status: DC

## 2021-05-14 NOTE — Assessment & Plan Note (Signed)
Lab Results  Component Value Date   HGBA1C 6.6 (H) 05/07/2021   New onset, discussed about starting metformin, patient wants to follow strict low-carb diet for now and will start metformin if HbA1C persistently elevated Advised to follow diabetic diet On statin and ACEi F/u CMP and lipid panel Diabetic eye exam: Advised to follow up with Ophthalmology for diabetic eye exam

## 2021-05-14 NOTE — Assessment & Plan Note (Signed)
BP Readings from Last 1 Encounters:  05/14/21 140/82   Well-controlled with Amlodipine and Lisinopril Counseled for compliance with the medications Advised DASH diet and moderate exercise/walking, at least 150 mins/week

## 2021-05-14 NOTE — Assessment & Plan Note (Signed)
Now better, DC Omeprazole, can use Pepcid PRN

## 2021-05-14 NOTE — Progress Notes (Signed)
Established Patient Office Visit  Subjective:  Patient ID: Daniel Chung, male    DOB: Aug 30, 1955  Age: 65 y.o. MRN: [id number]  CC:  Chief Complaint  Patient presents with   Follow-up    4 month F/U    HPI Daniel Chung is a 65 y.o. male with PMH of HTN, HLD, type 2 DM and GERD who presents for f/u of his chronic medical conditions.  HTN: His BP was elevated in the office today.  Of note, he admits that he misses some doses of medications.  He denies any headache, dizziness, chest pain, dyspnea or palpitations.  HLD: He is cholesterol profile was reviewed and discussed with him.  He admits that he has not been watching over his diet recently.  Type II DM: His Hgb A1c was 6.6.  He went to [place] to visit his brother and states that he has been eating a lot of of cheese products while he was there and also brought it back home.  He prefers to avoid starting metformin for now and states that he would follow strict low-carb diet for now.  He denies any fatigue, polyuria or polydipsia.  Past Medical History:  Diagnosis Date   Emphysema lung (HCC)    GERD (gastroesophageal reflux disease)    Hyperlipidemia    Hypertension    Peripheral neuropathy    Tinnitus     Past Surgical History:  Procedure Laterality Date   ELBOW SURGERY Left 1991   for tennis elbow   FEMORAL-POPLITEAL BYPASS GRAFT Left 2011   [place], [place]  age 26   VASECTOMY      Family History  Problem Relation Age of Onset   Scleroderma Mother    Diabetes Father    Hypertension Father    Cancer Sister    COPD Sister    Cancer Brother     Social History   Socioeconomic History   Marital status: Divorced    Spouse name: Not on file   Number of children: 3   Years of education: Not on file   Highest education level: Associate degree: occupational, Hotel manager, or vocational program  Occupational History    Comment: Retired   Tobacco Use   Smoking status: Former    Packs/day: 1.00     Years: 50.00    Pack years: 50.00    Types: Cigarettes    Quit date: 03/07/2018    Years since quitting: 3.1   Smokeless tobacco: Never  Vaping Use   Vaping Use: Never used  Substance and Sexual Activity   Alcohol use: Yes    Comment: rare social   Drug use: Yes    Types: Marijuana, PCP    Comment: none since his 20's   Sexual activity: Yes  Other Topics Concern   Not on file  Social History Narrative   Lives alone   3 children: 2 in [place], [place] in [place]      Enjoys: casino, auto racing-short tracks      Diet: lunch is biggest meal, eats all food groups   Caffeine: none really    Water: 4 16 oz bottles      Wears seat belt   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    Social Determinants of Health   Financial Resource Strain: Low Risk    Difficulty of Paying Living Expenses: Not hard at all  Food Insecurity: No Food Insecurity   Worried About Charity fundraiser in the  Last Year: Never true   Ran Out of Food in the Last Year: Never true  Transportation Needs: No Transportation Needs   Lack of Transportation (Medical): No   Lack of Transportation (Non-Medical): No  Physical Activity: Insufficiently Active   Days of Exercise per Week: 7 days   Minutes of Exercise per Session: 20 min  Stress: No Stress Concern Present   Feeling of Stress : Not at all  Social Connections: Socially Isolated   Frequency of Communication with Friends and Family: More than three times a week   Frequency of Social Gatherings with Friends and Family: Once a week   Attends Religious Services: Never   Marine scientist or Organizations: No   Attends Music therapist: Never   Marital Status: Divorced  Human resources officer Violence: Not At Risk   Fear of Current or Ex-Partner: No   Emotionally Abused: No   Physically Abused: No   Sexually Abused: No    Outpatient Medications Prior to Visit  Medication Sig Dispense Refill   Calcium Carbonate Antacid (TUMS CHEWY BITES PO)  Take 1 tablet by mouth daily.     Omega-3 Fatty Acids (FISH OIL) 1000 MG CAPS Take 1 capsule by mouth daily.      PROVENTIL HFA 108 (90 Base) MCG/ACT inhaler INHALE 2 PUFFS BY MOUTH EVERY 6 HOURS AS NEEDED FOR WHEEZING OR SHORTNESS OF BREATH 20.1 g 2   amLODipine (NORVASC) 10 MG tablet Take 1 tablet (10 mg total) by mouth daily. 90 tablet 1   atorvastatin (LIPITOR) 20 MG tablet Take 1 tablet (20 mg total) by mouth daily. 90 tablet 1   lisinopril (ZESTRIL) 40 MG tablet Take 1 tablet (40 mg total) by mouth daily. 90 tablet 1   omeprazole (PRILOSEC) 20 MG capsule Take 20 mg by mouth daily.     No facility-administered medications prior to visit.    No Known Allergies  ROS Review of Systems  Constitutional:  Negative for chills and fever.  HENT:  Negative for congestion and sore throat.   Eyes:  Negative for pain and discharge.  Respiratory:  Negative for cough and shortness of breath.   Cardiovascular:  Negative for chest pain and palpitations.  Gastrointestinal:  Negative for constipation, diarrhea, nausea and vomiting.  Endocrine: Negative for polydipsia and polyuria.  Genitourinary:  Negative for dysuria and hematuria.  Musculoskeletal:  Negative for neck pain and neck stiffness.  Skin:  Negative for rash.  Neurological:  Negative for dizziness, weakness, numbness and headaches.  Psychiatric/Behavioral:  Negative for agitation and behavioral problems.      Objective:    Physical Exam Vitals reviewed.  Constitutional:      General: He is not in acute distress.    Appearance: He is not diaphoretic.  HENT:     Head: Normocephalic and atraumatic.     Nose: Nose normal.     Mouth/Throat:     Mouth: Mucous membranes are moist.  Eyes:     General: No scleral icterus.    Extraocular Movements: Extraocular movements intact.  Cardiovascular:     Rate and Rhythm: Normal rate and regular rhythm.     Pulses: Normal pulses.     Heart sounds: Normal heart sounds. No murmur  heard. Pulmonary:     Breath sounds: Normal breath sounds. No wheezing or rales.  Abdominal:     Palpations: Abdomen is soft.     Tenderness: There is no abdominal tenderness.  Musculoskeletal:     Cervical back:  Neck supple. No tenderness.     Right lower leg: No edema.     Left lower leg: No edema.  Skin:    General: Skin is warm.     Findings: No rash.  Neurological:     General: No focal deficit present.     Mental Status: He is alert and oriented to person, place, and time.     Sensory: No sensory deficit.     Motor: No weakness.  Psychiatric:        Mood and Affect: Mood normal.        Behavior: Behavior normal.    BP 140/82 (BP Location: Left Arm, Patient Position: Sitting, Cuff Size: Normal)   Pulse 92   Temp 98.5 F (36.9 C) (Oral)   Ht 5' 11"  (1.803 m)   Wt 187 lb 0.6 oz (84.8 kg)   SpO2 96%   BMI 26.09 kg/m  Wt Readings from Last 3 Encounters:  05/14/21 187 lb 0.6 oz (84.8 kg)  01/12/21 191 lb (86.6 kg)  08/12/20 196 lb (88.9 kg)     Health Maintenance Due  Topic Date Due   FOOT EXAM  Never done   OPHTHALMOLOGY EXAM  Never done   TETANUS/TDAP  Never done   COLONOSCOPY (Pts 45-14yr Insurance coverage will need to be confirmed)  Never done   Zoster Vaccines- Shingrix (1 of 2) Never done    There are no preventive care reminders to display for this patient.  Lab Results  Component Value Date   TSH 1.630 05/07/2021   Lab Results  Component Value Date   WBC 11.2 (H) 05/07/2021   HGB 16.8 05/07/2021   HCT 48.1 05/07/2021   MCV 91 05/07/2021   PLT 227 05/07/2021   Lab Results  Component Value Date   NA 133 (L) 05/07/2021   K 4.5 05/07/2021   CO2 20 05/07/2021   GLUCOSE 110 (H) 05/07/2021   BUN 13 05/07/2021   CREATININE 0.92 05/07/2021   BILITOT 0.5 05/07/2021   ALKPHOS 113 05/07/2021   AST 17 05/07/2021   ALT 18 05/07/2021   PROT 6.9 05/07/2021   ALBUMIN 4.5 05/07/2021   CALCIUM 9.9 05/07/2021   ANIONGAP 9 06/07/2019   EGFR 92  05/07/2021   Lab Results  Component Value Date   CHOL 180 05/07/2021   Lab Results  Component Value Date   HDL 31 (L) 05/07/2021   Lab Results  Component Value Date   LDLCALC 118 (H) 05/07/2021   Lab Results  Component Value Date   TRIG 175 (H) 05/07/2021   Lab Results  Component Value Date   CHOLHDL 5.8 (H) 05/07/2021   Lab Results  Component Value Date   HGBA1C 6.6 (H) 05/07/2021      Assessment & Plan:   Problem List Items Addressed This Visit       Cardiovascular and Mediastinum   Essential hypertension - Primary    BP Readings from Last 1 Encounters:  05/14/21 140/82  Well-controlled with Amlodipine and Lisinopril Counseled for compliance with the medications Advised DASH diet and moderate exercise/walking, at least 150 mins/week      Relevant Medications   atorvastatin (LIPITOR) 40 MG tablet   amLODipine (NORVASC) 10 MG tablet   lisinopril (ZESTRIL) 40 MG tablet     Digestive   GERD (gastroesophageal reflux disease)    Now better, DC Omeprazole, can use Pepcid PRN        Endocrine   Type 2 diabetes mellitus with other specified  complication ([place])    Lab Results  Component Value Date   HGBA1C 6.6 (H) 05/07/2021  New onset, discussed about starting metformin, patient wants to follow strict low-carb diet for now and will start metformin if HbA1C persistently elevated Advised to follow diabetic diet On statin and ACEi F/u CMP and lipid panel Diabetic eye exam: Advised to follow up with Ophthalmology for diabetic eye exam       Relevant Medications   atorvastatin (LIPITOR) 40 MG tablet   lisinopril (ZESTRIL) 40 MG tablet     Other   Hyperlipidemia    Checked lipid profile Increased Atorvastatin from dose to 40 mg daily Needs to comply with DASH diet      Relevant Medications   atorvastatin (LIPITOR) 40 MG tablet   amLODipine (NORVASC) 10 MG tablet   lisinopril (ZESTRIL) 40 MG tablet    Meds ordered this encounter  Medications    atorvastatin (LIPITOR) 40 MG tablet    Sig: Take 1 tablet (40 mg total) by mouth daily.    Dispense:  90 tablet    Refill:  1   amLODipine (NORVASC) 10 MG tablet    Sig: Take 1 tablet (10 mg total) by mouth daily.    Dispense:  90 tablet    Refill:  1   lisinopril (ZESTRIL) 40 MG tablet    Sig: Take 1 tablet (40 mg total) by mouth daily.    Dispense:  90 tablet    Refill:  1    Follow-up: Return in about 4 months (around 09/14/2021) for HTN and HbA1C check.    Lindell Spar, MD

## 2021-05-14 NOTE — Assessment & Plan Note (Addendum)
Checked lipid profile Increased Atorvastatin from dose to 40 mg daily Needs to comply with DASH diet

## 2021-05-14 NOTE — Patient Instructions (Signed)
Please start taking Atorvastatin 40 mg once daily.  Please continue to take other medications regularly.

## 2021-05-28 ENCOUNTER — Ambulatory Visit: Payer: Medicare Other

## 2021-05-28 ENCOUNTER — Encounter (INDEPENDENT_AMBULATORY_CARE_PROVIDER_SITE_OTHER): Payer: Self-pay

## 2021-05-28 ENCOUNTER — Other Ambulatory Visit: Payer: Self-pay

## 2021-05-28 LAB — HM DIABETES EYE EXAM

## 2021-06-03 ENCOUNTER — Encounter: Payer: Self-pay | Admitting: *Deleted

## 2021-09-14 ENCOUNTER — Other Ambulatory Visit: Payer: Self-pay

## 2021-09-14 ENCOUNTER — Encounter: Payer: Self-pay | Admitting: Internal Medicine

## 2021-09-14 ENCOUNTER — Ambulatory Visit (INDEPENDENT_AMBULATORY_CARE_PROVIDER_SITE_OTHER): Payer: Medicare Other | Admitting: Internal Medicine

## 2021-09-14 VITALS — BP 138/82 | HR 81 | Resp 18 | Ht 71.0 in | Wt 191.0 lb

## 2021-09-14 DIAGNOSIS — E559 Vitamin D deficiency, unspecified: Secondary | ICD-10-CM

## 2021-09-14 DIAGNOSIS — J449 Chronic obstructive pulmonary disease, unspecified: Secondary | ICD-10-CM

## 2021-09-14 DIAGNOSIS — I10 Essential (primary) hypertension: Secondary | ICD-10-CM | POA: Diagnosis not present

## 2021-09-14 DIAGNOSIS — Z1211 Encounter for screening for malignant neoplasm of colon: Secondary | ICD-10-CM

## 2021-09-14 DIAGNOSIS — Z1329 Encounter for screening for other suspected endocrine disorder: Secondary | ICD-10-CM

## 2021-09-14 DIAGNOSIS — Z2821 Immunization not carried out because of patient refusal: Secondary | ICD-10-CM

## 2021-09-14 DIAGNOSIS — I739 Peripheral vascular disease, unspecified: Secondary | ICD-10-CM

## 2021-09-14 DIAGNOSIS — F17211 Nicotine dependence, cigarettes, in remission: Secondary | ICD-10-CM

## 2021-09-14 DIAGNOSIS — E782 Mixed hyperlipidemia: Secondary | ICD-10-CM

## 2021-09-14 DIAGNOSIS — E1169 Type 2 diabetes mellitus with other specified complication: Secondary | ICD-10-CM | POA: Diagnosis not present

## 2021-09-14 DIAGNOSIS — N400 Enlarged prostate without lower urinary tract symptoms: Secondary | ICD-10-CM

## 2021-09-14 LAB — POCT GLYCOSYLATED HEMOGLOBIN (HGB A1C): HbA1c, POC (controlled diabetic range): 6.5 % (ref 0.0–7.0)

## 2021-09-14 MED ORDER — ASPIRIN 81 MG PO TBEC: 81.0000 mg | DELAYED_RELEASE_TABLET | Freq: Every day | ORAL | 12 refills | Status: AC

## 2021-09-14 NOTE — Patient Instructions (Addendum)
Please start taking Aspirin as prescribed.  Please continue to follow low carb diet and perform moderate exercise/walking at least 150 mins/week.  You are being referred to get low dose CT chest done at [facility].

## 2021-09-14 NOTE — Progress Notes (Signed)
Established Patient Office Visit  Subjective:  Patient ID: Daniel Chung, male    DOB: 1956-03-02  Age: 66 y.o. MRN: [id number]  CC:  Chief Complaint  Patient presents with   Follow-up    4 month follow up HTN and A1c check     HPI Daniel Chung is a 66 y.o. male with past medical history of HTN, HLD, type 2 DM and GERD who presents for f/u of his chronic medical conditions.  HTN: BP is well-controlled. Takes medications regularly. Patient denies headache, dizziness, chest pain, dyspnea or palpitations.  Type II DM with HLD: His HbA1c is stable at 6.5.  He has been following strict low-carb diet, and still does not want to take any medication for it.  Denies any fatigue, polyuria or polydipsia currently.   Past Medical History:  Diagnosis Date   Emphysema lung (HCC)    GERD (gastroesophageal reflux disease)    Hyperlipidemia    Hypertension    Peripheral neuropathy    Tinnitus     Past Surgical History:  Procedure Laterality Date   ELBOW SURGERY Left 1991   for tennis elbow   FEMORAL-POPLITEAL BYPASS GRAFT Left 2011   [place], [place]  age 66   VASECTOMY      Family History  Problem Relation Age of Onset   Scleroderma Mother    Diabetes Father    Hypertension Father    Cancer Sister    COPD Sister    Cancer Brother     Social History   Socioeconomic History   Marital status: Divorced    Spouse name: Not on file   Number of children: 3   Years of education: Not on file   Highest education level: Associate degree: occupational, Hotel manager, or vocational program  Occupational History    Comment: Retired   Tobacco Use   Smoking status: Former    Packs/day: 1.00    Years: 50.00    Pack years: 50.00    Types: Cigarettes    Quit date: 03/07/2018    Years since quitting: 3.5   Smokeless tobacco: Never  Vaping Use   Vaping Use: Never used  Substance and Sexual Activity   Alcohol use: Yes    Comment: rare social   Drug use: Yes    Types:  Marijuana, PCP    Comment: none since his 20's   Sexual activity: Yes  Other Topics Concern   Not on file  Social History Narrative   Lives alone   3 children: 2 in [place], [place] in [place]      Enjoys: casino, auto racing-short tracks      Diet: lunch is biggest meal, eats all food groups   Caffeine: none really    Water: 4 16 oz bottles      Wears seat belt   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    Social Determinants of Health   Financial Resource Strain: Low Risk    Difficulty of Paying Living Expenses: Not hard at all  Food Insecurity: No Food Insecurity   Worried About Charity fundraiser in the Last Year: Never true   Arboriculturist in the Last Year: Never true  Transportation Needs: No Transportation Needs   Lack of Transportation (Medical): No   Lack of Transportation (Non-Medical): No  Physical Activity: Insufficiently Active   Days of Exercise per Week: 7 days   Minutes of Exercise per Session: 20 min  Stress: No  Stress Concern Present   Feeling of Stress : Not at all  Social Connections: Socially Isolated   Frequency of Communication with Friends and Family: More than three times a week   Frequency of Social Gatherings with Friends and Family: Once a week   Attends Religious Services: Never   Marine scientist or Organizations: No   Attends Music therapist: Never   Marital Status: Divorced  Human resources officer Violence: Not At Risk   Fear of Current or Ex-Partner: No   Emotionally Abused: No   Physically Abused: No   Sexually Abused: No    Outpatient Medications Prior to Visit  Medication Sig Dispense Refill   amLODipine (NORVASC) 10 MG tablet Take 1 tablet (10 mg total) by mouth daily. 90 tablet 1   atorvastatin (LIPITOR) 40 MG tablet Take 1 tablet (40 mg total) by mouth daily. 90 tablet 1   Calcium Carbonate Antacid (TUMS CHEWY BITES PO) Take 1 tablet by mouth daily.     lisinopril (ZESTRIL) 40 MG tablet Take 1 tablet (40 mg  total) by mouth daily. 90 tablet 1   PROVENTIL HFA 108 (90 Base) MCG/ACT inhaler INHALE 2 PUFFS BY MOUTH EVERY 6 HOURS AS NEEDED FOR WHEEZING OR SHORTNESS OF BREATH 20.1 g 2   Omega-3 Fatty Acids (FISH OIL) 1000 MG CAPS Take 1 capsule by mouth daily.  (Patient not taking: Reported on 09/14/2021)     No facility-administered medications prior to visit.    No Known Allergies  ROS Review of Systems  Constitutional:  Negative for chills and fever.  HENT:  Negative for congestion and sore throat.   Eyes:  Negative for pain and discharge.  Respiratory:  Negative for cough and shortness of breath.   Cardiovascular:  Negative for chest pain and palpitations.  Gastrointestinal:  Negative for constipation, diarrhea, nausea and vomiting.  Endocrine: Negative for polydipsia and polyuria.  Genitourinary:  Negative for dysuria and hematuria.  Musculoskeletal:  Negative for neck pain and neck stiffness.  Skin:  Negative for rash.  Neurological:  Negative for dizziness, weakness, numbness and headaches.  Psychiatric/Behavioral:  Negative for agitation and behavioral problems.      Objective:    Physical Exam Vitals reviewed.  Constitutional:      General: He is not in acute distress.    Appearance: He is not diaphoretic.  HENT:     Head: Normocephalic and atraumatic.     Nose: Nose normal.     Mouth/Throat:     Mouth: Mucous membranes are moist.  Eyes:     General: No scleral icterus.    Extraocular Movements: Extraocular movements intact.  Cardiovascular:     Rate and Rhythm: Normal rate and regular rhythm.     Pulses: Normal pulses.     Heart sounds: Normal heart sounds. No murmur heard. Pulmonary:     Breath sounds: Normal breath sounds. No wheezing or rales.  Abdominal:     Palpations: Abdomen is soft.     Tenderness: There is no abdominal tenderness.  Musculoskeletal:     Cervical back: Neck supple. No tenderness.     Right lower leg: No edema.     Left lower leg: No edema.   Skin:    General: Skin is warm.     Findings: No rash.  Neurological:     General: No focal deficit present.     Mental Status: He is alert and oriented to person, place, and time.     Sensory: No  sensory deficit.     Motor: No weakness.  Psychiatric:        Mood and Affect: Mood normal.        Behavior: Behavior normal.    BP 138/82 (BP Location: Right Arm, Patient Position: Sitting, Cuff Size: Normal)    Pulse 81    Resp 18    Ht 5' 11"  (1.803 m)    Wt 191 lb (86.6 kg)    SpO2 96%    BMI 26.64 kg/m  Wt Readings from Last 3 Encounters:  09/14/21 191 lb (86.6 kg)  05/14/21 187 lb 0.6 oz (84.8 kg)  01/12/21 191 lb (86.6 kg)    Lab Results  Component Value Date   TSH 1.630 05/07/2021   Lab Results  Component Value Date   WBC 11.2 (H) 05/07/2021   HGB 16.8 05/07/2021   HCT 48.1 05/07/2021   MCV 91 05/07/2021   PLT 227 05/07/2021   Lab Results  Component Value Date   NA 133 (L) 05/07/2021   K 4.5 05/07/2021   CO2 20 05/07/2021   GLUCOSE 110 (H) 05/07/2021   BUN 13 05/07/2021   CREATININE 0.92 05/07/2021   BILITOT 0.5 05/07/2021   ALKPHOS 113 05/07/2021   AST 17 05/07/2021   ALT 18 05/07/2021   PROT 6.9 05/07/2021   ALBUMIN 4.5 05/07/2021   CALCIUM 9.9 05/07/2021   ANIONGAP 9 06/07/2019   EGFR 92 05/07/2021   Lab Results  Component Value Date   CHOL 180 05/07/2021   Lab Results  Component Value Date   HDL 31 (L) 05/07/2021   Lab Results  Component Value Date   LDLCALC 118 (H) 05/07/2021   Lab Results  Component Value Date   TRIG 175 (H) 05/07/2021   Lab Results  Component Value Date   CHOLHDL 5.8 (H) 05/07/2021   Lab Results  Component Value Date   HGBA1C 6.5 09/14/2021      Assessment & Plan:   Problem List Items Addressed This Visit       Cardiovascular and Mediastinum   Essential hypertension    BP Readings from Last 1 Encounters:  09/14/21 138/82  Well-controlled with Amlodipine and Lisinopril Counseled for compliance with the  medications Advised DASH diet and moderate exercise/walking, at least 150 mins/week      Relevant Medications   aspirin 81 MG EC tablet   Other Relevant Orders   TSH   CBC with Differential/Platelet   Peripheral vascular disease ([place])    On aspirin and statin currently      Relevant Medications   aspirin 81 MG EC tablet     Respiratory   Chronic obstructive pulmonary disease ([place])    Well controlled with as needed albuterol      Relevant Orders   CBC with Differential/Platelet     Endocrine   Type 2 diabetes mellitus with other specified complication ([place]) - Primary    Lab Results  Component Value Date   HGBA1C 6.5 09/14/2021  New onset, discussed about starting metformin, patient wants to follow strict low-carb diet for now and will start metformin if HbA1C persistently elevated Advised to follow diabetic diet On statin and ACEi F/u CMP and lipid panel Diabetic eye exam: Advised to follow up with Ophthalmology for diabetic eye exam      Relevant Medications   aspirin 81 MG EC tablet   Other Relevant Orders   POCT glycosylated hemoglobin (Hb A1C) (Completed)   Hemoglobin A1c   CMP14+EGFR  Genitourinary   Benign prostatic hyperplasia without lower urinary tract symptoms    Mild nocturia Check PSA      Relevant Orders   PSA     Other   Hyperlipidemia    Checked lipid profile Continue atorvastatin 40 mg daily Needs to comply with DASH diet      Relevant Medications   aspirin 81 MG EC tablet   Other Relevant Orders   Lipid Profile   Cigarette nicotine dependence in remission    Quit smoking in 2019, has >20 pack-year smoking history      Relevant Orders   CT CHEST LUNG CANCER SCREENING LOW DOSE WO CONTRAST   Other Visit Diagnoses     Special screening for malignant neoplasms, colon       Relevant Orders   Cologuard   Vitamin D deficiency       Relevant Orders   VITAMIN D 25 Hydroxy (Vit-D Deficiency, Fractures)   Screening for thyroid  disorder       Relevant Orders   TSH   Refused influenza vaccine           Meds ordered this encounter  Medications   aspirin 81 MG EC tablet    Sig: Take 1 tablet (81 mg total) by mouth daily. Swallow whole.    Dispense:  30 tablet    Refill:  12    Follow-up: Return in about 6 months (around 03/14/2022) for DM and HTN.    Lindell Spar, MD

## 2021-09-15 ENCOUNTER — Ambulatory Visit: Payer: Medicare Other | Admitting: Internal Medicine

## 2021-09-18 DIAGNOSIS — N401 Enlarged prostate with lower urinary tract symptoms: Secondary | ICD-10-CM | POA: Insufficient documentation

## 2021-09-18 DIAGNOSIS — F17211 Nicotine dependence, cigarettes, in remission: Secondary | ICD-10-CM | POA: Insufficient documentation

## 2021-09-18 DIAGNOSIS — N400 Enlarged prostate without lower urinary tract symptoms: Secondary | ICD-10-CM | POA: Insufficient documentation

## 2021-09-18 NOTE — Assessment & Plan Note (Signed)
Checked lipid profile Continue atorvastatin 40 mg daily Needs to comply with DASH diet

## 2021-09-18 NOTE — Assessment & Plan Note (Signed)
Well controlled with as needed albuterol 

## 2021-09-18 NOTE — Assessment & Plan Note (Signed)
On aspirin and statin currently

## 2021-09-18 NOTE — Assessment & Plan Note (Signed)
Lab Results  Component Value Date   HGBA1C 6.5 09/14/2021   New onset, discussed about starting metformin, patient wants to follow strict low-carb diet for now and will start metformin if HbA1C persistently elevated Advised to follow diabetic diet On statin and ACEi F/u CMP and lipid panel Diabetic eye exam: Advised to follow up with Ophthalmology for diabetic eye exam

## 2021-09-18 NOTE — Assessment & Plan Note (Signed)
Quit smoking in 2019, has >20 pack-year smoking history

## 2021-09-18 NOTE — Assessment & Plan Note (Signed)
BP Readings from Last 1 Encounters:  09/14/21 138/82   Well-controlled with Amlodipine and Lisinopril Counseled for compliance with the medications Advised DASH diet and moderate exercise/walking, at least 150 mins/week

## 2021-09-18 NOTE — Assessment & Plan Note (Addendum)
Mild nocturia Check PSA

## 2021-10-07 LAB — COLOGUARD: COLOGUARD: POSITIVE — AB

## 2021-10-08 ENCOUNTER — Other Ambulatory Visit: Payer: Self-pay | Admitting: *Deleted

## 2021-10-08 DIAGNOSIS — R195 Other fecal abnormalities: Secondary | ICD-10-CM

## 2021-10-12 ENCOUNTER — Encounter: Payer: Self-pay | Admitting: Internal Medicine

## 2021-10-22 ENCOUNTER — Ambulatory Visit (HOSPITAL_COMMUNITY): Payer: Medicare Other

## 2021-12-14 ENCOUNTER — Ambulatory Visit (INDEPENDENT_AMBULATORY_CARE_PROVIDER_SITE_OTHER): Payer: Self-pay | Admitting: *Deleted

## 2021-12-14 VITALS — Ht 71.0 in | Wt 185.0 lb

## 2021-12-14 DIAGNOSIS — Z1211 Encounter for screening for malignant neoplasm of colon: Secondary | ICD-10-CM

## 2021-12-14 NOTE — Progress Notes (Addendum)
Gastroenterology Pre-Procedure Review ? ?Request Date: 12/14/2021 ?Requesting Physician: Dr. Ihor Dow at [facility], Last TCS 10-12 years ago done in [place], pt could not remember the physician who did it, pt says normal findings, family hx of colon cancer (brother), positive cologuard Medicare screening ? ?PATIENT REVIEW QUESTIONS: The patient responded to the following health history questions as indicated:   ? ?1. Diabetes Melitis: no, borderline controlled by diet and exercise ?2. Joint replacements in the past 12 months: no ?3. Major health problems in the past 3 months: yes, Covid March 2023 ?4. Has an artificial valve or MVP: no ?5. Has a defibrillator: no ?6. Has been advised in past to take antibiotics in advance of a procedure like teeth cleaning: no ?7. Family history of colon cancer: yes, brother: age 76 ?65. Alcohol Use: yes, 1 beer a month ?9. Illicit drug Use: yes, marijuana every 3 to 4 days ?10. History of sleep apnea:  no ?11. History of coronary artery or other vascular stents placed within the last 12 months: no ?12. History of any prior anesthesia complications: no ?13. Body mass index is 25.8 kg/m?. ?   ?MEDICATIONS & ALLERGIES:    ?Patient reports the following regarding taking any blood thinners:   ?Plavix? no ?Aspirin? Yes, 81 mg daily ?Coumadin? no ?Brilinta? no ?Xarelto? no ?Eliquis? no ?Pradaxa? no ?Savaysa? no ?Effient? no ? ?Patient confirms/reports the following medications:  ?Current Outpatient Medications  ?Medication Sig Dispense Refill  ? amLODipine (NORVASC) 10 MG tablet Take 1 tablet (10 mg total) by mouth daily. 90 tablet 1  ? aspirin 81 MG EC tablet Take 1 tablet (81 mg total) by mouth daily. Swallow whole. 30 tablet 12  ? atorvastatin (LIPITOR) 40 MG tablet Take 1 tablet (40 mg total) by mouth daily. 90 tablet 1  ? Calcium Carbonate Antacid (TUMS CHEWY BITES PO) Take 1 tablet by mouth as needed.    ? lisinopril (ZESTRIL) 40 MG tablet Take 1 tablet (40 mg total)  by mouth daily. 90 tablet 1  ? PROVENTIL HFA 108 (90 Base) MCG/ACT inhaler INHALE 2 PUFFS BY MOUTH EVERY 6 HOURS AS NEEDED FOR WHEEZING OR SHORTNESS OF BREATH (Patient taking differently: as needed.) 20.1 g 2  ? ?No current facility-administered medications for this visit.  ? ? ?Patient confirms/reports the following allergies:  ?No Known Allergies ? ?No orders of the defined types were placed in this encounter. ? ? ?AUTHORIZATION INFORMATION ?Primary Insurance: Medicare,  ID #: [id number] ?Pre-Cert / Josem Kaufmann required: No, not required ? ?Secondary Insurance: [employer],  [place] #: [id number],  Group #: Plan [id number] ?Pre-Cert / Josem Kaufmann required: No, not required ? ?SCHEDULE INFORMATION: ?Procedure has been scheduled as follows:  ?Date: 01/05/2022, Time: 1:00 ?Location: [facility] with Dr. Abbey Chatters ? ?This Gastroenterology Pre-Precedure Review Form is being routed to the following provider(s): Neil Crouch, PA-C ?  ?

## 2021-12-14 NOTE — Progress Notes (Signed)
Ok to schedule. ASA 3.   

## 2021-12-14 NOTE — Progress Notes (Signed)
12/14/2021-Lmom for pt to call me back. ?

## 2021-12-16 ENCOUNTER — Encounter: Payer: Self-pay | Admitting: *Deleted

## 2021-12-16 MED ORDER — PEG 3350-KCL-NA BICARB-NACL 420 G PO SOLR: 4000.0000 mL | Freq: Once | ORAL | 0 refills | Status: AC

## 2021-12-16 NOTE — Addendum Note (Signed)
Addended by: Metro Kung on: 12/16/2021 03:43 PM ? ? Modules accepted: Orders ? ?

## 2021-12-17 ENCOUNTER — Encounter: Payer: Self-pay | Admitting: *Deleted

## 2021-12-17 NOTE — Progress Notes (Signed)
Spoke to pt.  Pt made aware Pre-op 01/21/2022 at 10:00 at [facility].  Scheduled procedure for 01/25/2022.  Pt made aware that procedure time will be given at his Pre-op appointment.  Reviewed prep instructions with pt by phone.  He is aware that I sent RX to his pharmacy.  He is also aware of OTC items needed (enema and dulcolax).  Confirmed mailing address and mailed instructions.

## 2021-12-31 ENCOUNTER — Ambulatory Visit (HOSPITAL_COMMUNITY)
Admission: RE | Admit: 2021-12-31 | Discharge: 2021-12-31 | Disposition: A | Discharge status: Home / Self Care | Payer: Medicare Other | Source: Ambulatory Visit | Attending: Internal Medicine | Admitting: Internal Medicine

## 2021-12-31 DIAGNOSIS — F17211 Nicotine dependence, cigarettes, in remission: Secondary | ICD-10-CM | POA: Diagnosis not present

## 2022-01-01 ENCOUNTER — Telehealth: Payer: Self-pay | Admitting: *Deleted

## 2022-01-01 ENCOUNTER — Encounter: Payer: Self-pay | Admitting: Internal Medicine

## 2022-01-01 DIAGNOSIS — I714 Abdominal aortic aneurysm, without rupture, unspecified: Secondary | ICD-10-CM | POA: Insufficient documentation

## 2022-01-01 DIAGNOSIS — R918 Other nonspecific abnormal finding of lung field: Secondary | ICD-10-CM | POA: Insufficient documentation

## 2022-01-01 DIAGNOSIS — I700 Atherosclerosis of aorta: Secondary | ICD-10-CM | POA: Insufficient documentation

## 2022-01-01 NOTE — Telephone Encounter (Signed)
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

## 2022-01-05 ENCOUNTER — Other Ambulatory Visit: Payer: Self-pay | Admitting: Internal Medicine

## 2022-01-05 DIAGNOSIS — E782 Mixed hyperlipidemia: Secondary | ICD-10-CM

## 2022-01-19 NOTE — Patient Instructions (Signed)
Daniel Chung  01/19/2022     '@PREFPERIOPPHARMACY'$ @   Your procedure is scheduled on  01/25/2022.    Report to [facility] Na at  [place].   Call this number if you have problems the morning of surgery:  [phone]   Remember:  Follow the diet and prep instructions given to you by the office.     Use your inhaler before you come and bring your rescue inhaler with you.    Take these medicines the morning of surgery with A SIP OF WATER                                      Norvasc     Do not wear jewelry, make-up or nail polish.  Do not wear lotions, powders, or perfumes, or deodorant.  Do not shave 48 hours prior to surgery.  Men may shave face and neck.  Do not bring valuables to the hospital.  [facility] is not responsible for any belongings or valuables.  Contacts, dentures or bridgework may not be worn into surgery.  Leave your suitcase in the car.  After surgery it may be brought to your room.  For patients admitted to the hospital, discharge time will be determined by your treatment team.  Patients discharged the day of surgery will not be allowed to drive home and must have someone with them for 24 hours.    Special instructions:   DO NOT smoke tobacco or vape for 24 hours before your procedure.  Please read over the following fact sheets that you were given. Anesthesia Post-op Instructions and Care and Recovery After Surgery      Colonoscopy, Adult, Care After The following information offers guidance on how to care for yourself after your procedure. Your health care provider may also give you more specific instructions. If you have problems or questions, contact your health care provider. What can I expect after the procedure? After the procedure, it is common to have: A small amount of blood in your stool for 24 hours after the procedure. Some gas. Mild cramping or bloating of your abdomen. Follow these instructions at home: Eating and  drinking  Drink enough fluid to keep your urine pale yellow. Follow instructions from your health care provider about eating or drinking restrictions. Resume your normal diet as told by your health care provider. Avoid heavy or fried foods that are hard to digest. Activity Rest as told by your health care provider. Avoid sitting for a long time without moving. Get up to take short walks every 1-2 hours. This is important to improve blood flow and breathing. Ask for help if you feel weak or unsteady. Return to your normal activities as told by your health care provider. Ask your health care provider what activities are safe for you. Managing cramping and bloating  Try walking around when you have cramps or feel bloated. If directed, apply heat to your abdomen as told by your health care provider. Use the heat source that your health care provider recommends, such as a moist heat pack or a heating pad. Place a towel between your skin and the heat source. Leave the heat on for 20-30 minutes. Remove the heat if your skin turns bright red. This is especially important if you are unable to feel pain, heat, or cold. You have a greater risk of getting burned. General  instructions If you were given a sedative during the procedure, it can affect you for several hours. Do not drive or operate machinery until your health care provider says that it is safe. For the first 24 hours after the procedure: Do not sign important documents. Do not drink alcohol. Do your regular daily activities at a slower pace than normal. Eat soft foods that are easy to digest. Take over-the-counter and prescription medicines only as told by your health care provider. Keep all follow-up visits. This is important. Contact a health care provider if: You have blood in your stool 2-3 days after the procedure. Get help right away if: You have more than a small spotting of blood in your stool. You have large blood clots in your  stool. You have swelling of your abdomen. You have nausea or vomiting. You have a fever. You have increasing pain in your abdomen that is not relieved with medicine. These symptoms may be an emergency. Get help right away. Call 911. Do not wait to see if the symptoms will go away. Do not drive yourself to the hospital. Summary After the procedure, it is common to have a small amount of blood in your stool. You may also have mild cramping and bloating of your abdomen. If you were given a sedative during the procedure, it can affect you for several hours. Do not drive or operate machinery until your health care provider says that it is safe. Get help right away if you have a lot of blood in your stool, nausea or vomiting, a fever, or increased pain in your abdomen. This information is not intended to replace advice given to you by your health care provider. Make sure you discuss any questions you have with your health care provider. Document Revised: 03/11/2021 Document Reviewed: 03/11/2021 Elsevier Patient Education  [place] After This sheet gives you information about how to care for yourself after your procedure. Your health care provider may also give you more specific instructions. If you have problems or questions, contact your health care provider. What can I expect after the procedure? After the procedure, it is common to have: Tiredness. Forgetfulness about what happened after the procedure. Impaired judgment for important decisions. Nausea or vomiting. Some difficulty with balance. Follow these instructions at home: For the time period you were told by your health care provider:     Rest as needed. Do not participate in activities where you could fall or become injured. Do not drive or use machinery. Do not drink alcohol. Do not take sleeping pills or medicines that cause drowsiness. Do not make important decisions or sign legal  documents. Do not take care of children on your own. Eating and drinking Follow the diet that is recommended by your health care provider. Drink enough fluid to keep your urine pale yellow. If you vomit: Drink water, juice, or soup when you can drink without vomiting. Make sure you have little or no nausea before eating solid foods. General instructions Have a responsible adult stay with you for the time you are told. It is important to have someone help care for you until you are awake and alert. Take over-the-counter and prescription medicines only as told by your health care provider. If you have sleep apnea, surgery and certain medicines can increase your risk for breathing problems. Follow instructions from your health care provider about wearing your sleep device: Anytime you are sleeping, including during daytime naps. While taking prescription  pain medicines, sleeping medicines, or medicines that make you drowsy. Avoid smoking. Keep all follow-up visits as told by your health care provider. This is important. Contact a health care provider if: You keep feeling nauseous or you keep vomiting. You feel light-headed. You are still sleepy or having trouble with balance after 24 hours. You develop a rash. You have a fever. You have redness or swelling around the IV site. Get help right away if: You have trouble breathing. You have new-onset confusion at home. Summary For several hours after your procedure, you may feel tired. You may also be forgetful and have poor judgment. Have a responsible adult stay with you for the time you are told. It is important to have someone help care for you until you are awake and alert. Rest as told. Do not drive or operate machinery. Do not drink alcohol or take sleeping pills. Get help right away if you have trouble breathing, or if you suddenly become confused. This information is not intended to replace advice given to you by your health care  provider. Make sure you discuss any questions you have with your health care provider. Document Revised: 06/23/2021 Document Reviewed: 06/21/2019 Elsevier Patient Education  [place].

## 2022-01-21 ENCOUNTER — Encounter (HOSPITAL_COMMUNITY): Payer: Self-pay

## 2022-01-21 ENCOUNTER — Encounter (HOSPITAL_COMMUNITY)
Admission: RE | Admit: 2022-01-21 | Discharge: 2022-01-21 | Disposition: A | Discharge status: Home / Self Care | Payer: Medicare Other | Source: Ambulatory Visit | Attending: Internal Medicine | Admitting: Internal Medicine

## 2022-01-21 VITALS — BP 146/70 | HR 73 | Temp 98.3°F | Resp 16 | Ht 71.0 in | Wt 187.0 lb

## 2022-01-21 DIAGNOSIS — Z01810 Encounter for preprocedural cardiovascular examination: Secondary | ICD-10-CM | POA: Insufficient documentation

## 2022-01-21 DIAGNOSIS — I10 Essential (primary) hypertension: Secondary | ICD-10-CM | POA: Insufficient documentation

## 2022-01-21 HISTORY — DX: Angina pectoris, unspecified: I20.9

## 2022-01-21 HISTORY — DX: Unspecified osteoarthritis, unspecified site: M19.90

## 2022-01-25 ENCOUNTER — Telehealth: Payer: Self-pay

## 2022-01-25 ENCOUNTER — Ambulatory Visit (HOSPITAL_COMMUNITY): Payer: Medicare Other | Admitting: Certified Registered"

## 2022-01-25 ENCOUNTER — Encounter (HOSPITAL_COMMUNITY): Payer: Self-pay

## 2022-01-25 ENCOUNTER — Ambulatory Visit (HOSPITAL_COMMUNITY)
Admission: RE | Admit: 2022-01-25 | Discharge: 2022-01-25 | Disposition: A | Discharge status: Home / Self Care | Payer: Medicare Other | Attending: Internal Medicine | Admitting: Internal Medicine

## 2022-01-25 ENCOUNTER — Encounter (HOSPITAL_COMMUNITY)
Admission: RE | Disposition: A | Discharge status: Home / Self Care | Payer: Self-pay | Source: Home / Self Care | Attending: Internal Medicine

## 2022-01-25 ENCOUNTER — Ambulatory Visit (HOSPITAL_BASED_OUTPATIENT_CLINIC_OR_DEPARTMENT_OTHER): Payer: Medicare Other | Admitting: Certified Registered"

## 2022-01-25 DIAGNOSIS — K5730 Diverticulosis of large intestine without perforation or abscess without bleeding: Secondary | ICD-10-CM | POA: Diagnosis not present

## 2022-01-25 DIAGNOSIS — D128 Benign neoplasm of rectum: Secondary | ICD-10-CM | POA: Insufficient documentation

## 2022-01-25 DIAGNOSIS — Z800 Family history of malignant neoplasm of digestive organs: Secondary | ICD-10-CM | POA: Diagnosis not present

## 2022-01-25 DIAGNOSIS — D124 Benign neoplasm of descending colon: Secondary | ICD-10-CM | POA: Insufficient documentation

## 2022-01-25 DIAGNOSIS — K648 Other hemorrhoids: Secondary | ICD-10-CM

## 2022-01-25 DIAGNOSIS — Z1211 Encounter for screening for malignant neoplasm of colon: Secondary | ICD-10-CM | POA: Diagnosis present

## 2022-01-25 DIAGNOSIS — Z87891 Personal history of nicotine dependence: Secondary | ICD-10-CM | POA: Diagnosis not present

## 2022-01-25 DIAGNOSIS — E1151 Type 2 diabetes mellitus with diabetic peripheral angiopathy without gangrene: Secondary | ICD-10-CM | POA: Diagnosis not present

## 2022-01-25 DIAGNOSIS — K621 Rectal polyp: Secondary | ICD-10-CM | POA: Diagnosis not present

## 2022-01-25 DIAGNOSIS — J439 Emphysema, unspecified: Secondary | ICD-10-CM | POA: Diagnosis not present

## 2022-01-25 DIAGNOSIS — I10 Essential (primary) hypertension: Secondary | ICD-10-CM | POA: Insufficient documentation

## 2022-01-25 DIAGNOSIS — K635 Polyp of colon: Secondary | ICD-10-CM | POA: Diagnosis not present

## 2022-01-25 DIAGNOSIS — D120 Benign neoplasm of cecum: Secondary | ICD-10-CM | POA: Insufficient documentation

## 2022-01-25 DIAGNOSIS — K5520 Angiodysplasia of colon without hemorrhage: Secondary | ICD-10-CM | POA: Diagnosis not present

## 2022-01-25 DIAGNOSIS — K219 Gastro-esophageal reflux disease without esophagitis: Secondary | ICD-10-CM | POA: Diagnosis not present

## 2022-01-25 HISTORY — PX: COLONOSCOPY WITH PROPOFOL: SHX5780

## 2022-01-25 HISTORY — PX: POLYPECTOMY: SHX5525

## 2022-01-25 LAB — GLUCOSE, CAPILLARY: Glucose-Capillary: 151 mg/dL — ABNORMAL HIGH (ref 70–99)

## 2022-01-25 SURGERY — COLONOSCOPY WITH PROPOFOL
Anesthesia: General

## 2022-01-25 MED ORDER — PHENYLEPHRINE 80 MCG/ML (10ML) SYRINGE FOR IV PUSH (FOR BLOOD PRESSURE SUPPORT)
PREFILLED_SYRINGE | INTRAVENOUS | Status: DC | PRN
  Administered 2022-01-25 (×4): 160 ug via INTRAVENOUS

## 2022-01-25 MED ORDER — LACTATED RINGERS IV SOLN: INTRAVENOUS | Status: DC | PRN

## 2022-01-25 MED ORDER — LIDOCAINE 2% (20 MG/ML) 5 ML SYRINGE
INTRAMUSCULAR | Status: DC | PRN
  Administered 2022-01-25: 50 mg via INTRAVENOUS

## 2022-01-25 MED ORDER — PROPOFOL 10 MG/ML IV BOLUS
INTRAVENOUS | Status: DC | PRN
  Administered 2022-01-25: 100 mg via INTRAVENOUS

## 2022-01-25 MED ORDER — PROPOFOL 500 MG/50ML IV EMUL
INTRAVENOUS | Status: DC | PRN
  Administered 2022-01-25: 150 ug/kg/min via INTRAVENOUS

## 2022-01-25 NOTE — Telephone Encounter (Signed)
Dr Marletta Lor, Pt called wanting to know what you had told him this morning regarding his colonoscopy. Please advise

## 2022-01-25 NOTE — Anesthesia Preprocedure Evaluation (Signed)
Anesthesia Evaluation  Patient identified by MRN, date of birth, ID band Patient awake    Reviewed: Allergy & Precautions, H&P , NPO status , Patient's Chart, lab work & pertinent test results, reviewed documented beta blocker date and time   Airway Mallampati: II  TM Distance: >3 FB Neck ROM: full    Dental no notable dental hx.    Pulmonary COPD, former smoker,    Pulmonary exam normal breath sounds clear to auscultation       Cardiovascular Exercise Tolerance: Good hypertension, + Peripheral Vascular Disease   Rhythm:regular Rate:Normal     Neuro/Psych  Neuromuscular disease negative psych ROS   GI/Hepatic Neg liver ROS, GERD  Medicated,  Endo/Other  diabetes, Type 2  Renal/GU negative Renal ROS  negative genitourinary   Musculoskeletal   Abdominal   Peds  Hematology negative hematology ROS (+)   Anesthesia Other Findings   Reproductive/Obstetrics negative OB ROS                             Anesthesia Physical Anesthesia Plan  ASA: 3  Anesthesia Plan: General   Post-op Pain Management:    Induction:   PONV Risk Score and Plan: Propofol infusion  Airway Management Planned:   Additional Equipment:   Intra-op Plan:   Post-operative Plan:   Informed Consent: I have reviewed the patients History and Physical, chart, labs and discussed the procedure including the risks, benefits and alternatives for the proposed anesthesia with the patient or authorized representative who has indicated his/her understanding and acceptance.     Dental Advisory Given  Plan Discussed with: CRNA  Anesthesia Plan Comments:         Anesthesia Quick Evaluation

## 2022-01-26 LAB — SURGICAL PATHOLOGY

## 2022-01-28 NOTE — Telephone Encounter (Signed)
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  We will need to repeat colonoscopy in 5 years as we discussed postoperatively.  Follow-up with GI as needed.  Thank you

## 2022-01-29 ENCOUNTER — Encounter (HOSPITAL_COMMUNITY): Payer: Self-pay | Admitting: Internal Medicine

## 2022-01-29 NOTE — Telephone Encounter (Signed)
Phoned and advised the pt of the result note and repeat colonoscopy in 5 years  Daniel Chung please [place] for 5 year colonoscopy

## 2022-01-29 NOTE — Telephone Encounter (Signed)
Phoned and LMOVM for the pt to return call 

## 2022-01-30 ENCOUNTER — Other Ambulatory Visit: Payer: Self-pay | Admitting: Internal Medicine

## 2022-01-30 DIAGNOSIS — I10 Essential (primary) hypertension: Secondary | ICD-10-CM

## 2022-02-04 NOTE — Telephone Encounter (Signed)
noted 

## 2022-02-12 ENCOUNTER — Ambulatory Visit (INDEPENDENT_AMBULATORY_CARE_PROVIDER_SITE_OTHER): Payer: Medicare Other

## 2022-02-12 DIAGNOSIS — Z0000 Encounter for general adult medical examination without abnormal findings: Secondary | ICD-10-CM

## 2022-02-12 NOTE — Patient Instructions (Addendum)
Daniel Chung , Thank you for taking time to come for your Medicare Wellness Visit. I appreciate your ongoing commitment to your health goals. Please review the following plan we discussed and let me know if I can assist you in the future.   These are the goals we discussed:  Goals      Patient Stated     Get follow up low dose chest scan Sept 1 or soon after     Weight (lb) < 180 lb (81.6 kg)        This is a list of the screening recommended for you and due dates:  Health Maintenance  Topic Date Due   COVID-19 Vaccine (1) Never done   Tetanus Vaccine  Never done   Zoster (Shingles) Vaccine (1 of 2) Never done   Pneumonia Vaccine (1 - PCV) Never done   Flu Shot  03/02/2022   Hemoglobin A1C  03/14/2022   Eye exam for diabetics  05/28/2022   Complete foot exam   09/14/2022   Cologuard (Stool DNA test)  09/30/2024   Hepatitis C Screening: USPSTF Recommendation to screen - Ages 18-79 yo.  Completed   HPV Vaccine  Aged Out   Colon Cancer Screening  Discontinued      Health Maintenance, Male Adopting a healthy lifestyle and getting preventive care are important in promoting health and wellness. Ask your health care provider about: The right schedule for you to have regular tests and exams. Things you can do on your own to prevent diseases and keep yourself healthy. What should I know about diet, weight, and exercise? Eat a healthy diet  Eat a diet that includes plenty of vegetables, fruits, low-fat dairy products, and lean protein. Do not eat a lot of foods that are high in solid fats, added sugars, or sodium. Maintain a healthy weight Body mass index (BMI) is a measurement that can be used to identify possible weight problems. It estimates body fat based on height and weight. Your health care provider can help determine your BMI and help you achieve or maintain a healthy weight. Get regular exercise Get regular exercise. This is one of the most important things you can do for  your health. Most adults should: Exercise for at least 150 minutes each week. The exercise should increase your heart rate and make you sweat (moderate-intensity exercise). Do strengthening exercises at least twice a week. This is in addition to the moderate-intensity exercise. Spend less time sitting. Even light physical activity can be beneficial. Watch cholesterol and blood lipids Have your blood tested for lipids and cholesterol at 67 years of age, then have this test every 5 years. You may need to have your cholesterol levels checked more often if: Your lipid or cholesterol levels are high. You are older than 67 years of age. You are at high risk for heart disease. What should I know about cancer screening? Many types of cancers can be detected early and may often be prevented. Depending on your health history and family history, you may need to have cancer screening at various ages. This may include screening for: Colorectal cancer. Prostate cancer. Skin cancer. Lung cancer. What should I know about heart disease, diabetes, and high blood pressure? Blood pressure and heart disease High blood pressure causes heart disease and increases the risk of stroke. This is more likely to develop in people who have high blood pressure readings or are overweight. Talk with your health care provider about your target blood pressure readings.  Have your blood pressure checked: Every 3-5 years if you are 61-32 years of age. Every year if you are 54 years old or older. If you are between the ages of 74 and 72 and are a current or former smoker, ask your health care provider if you should have a one-time screening for abdominal aortic aneurysm (AAA). Diabetes Have regular diabetes screenings. This checks your fasting blood sugar level. Have the screening done: Once every three years after age 1 if you are at a normal weight and have a low risk for diabetes. More often and at a younger age if you are  overweight or have a high risk for diabetes. What should I know about preventing infection? Hepatitis B If you have a higher risk for hepatitis B, you should be screened for this virus. Talk with your health care provider to find out if you are at risk for hepatitis B infection. Hepatitis C Blood testing is recommended for: Everyone born from [birth year] through 1966. Anyone with known risk factors for hepatitis C. Sexually transmitted infections (STIs) You should be screened each year for STIs, including gonorrhea and chlamydia, if: You are sexually active and are younger than 67 years of age. You are older than 67 years of age and your health care provider tells you that you are at risk for this type of infection. Your sexual activity has changed since you were last screened, and you are at increased risk for chlamydia or gonorrhea. Ask your health care provider if you are at risk. Ask your health care provider about whether you are at high risk for HIV. Your health care provider may recommend a prescription medicine to help prevent HIV infection. If you choose to take medicine to prevent HIV, you should first get tested for HIV. You should then be tested every 3 months for as long as you are taking the medicine. Follow these instructions at home: Alcohol use Do not drink alcohol if your health care provider tells you not to drink. If you drink alcohol: Limit how much you have to 0-2 drinks a day. Know how much alcohol is in your drink. In the [place] one drink equals one 12 oz bottle of beer (355 mL), one 5 oz glass of wine (148 mL), or one 1 oz glass of hard liquor (44 mL). Lifestyle Do not use any products that contain nicotine or tobacco. These products include cigarettes, chewing tobacco, and vaping devices, such as e-cigarettes. If you need help quitting, ask your health care provider. Do not use street drugs. Do not share needles. Ask your health care provider for help if you need support or  information about quitting drugs. General instructions Schedule regular health, dental, and eye exams. Stay current with your vaccines. Tell your health care provider if: You often feel depressed. You have ever been abused or do not feel safe at home. Summary Adopting a healthy lifestyle and getting preventive care are important in promoting health and wellness. Follow your health care provider's instructions about healthy diet, exercising, and getting tested or screened for diseases. Follow your health care provider's instructions on monitoring your cholesterol and blood pressure. This information is not intended to replace advice given to you by your health care provider. Make sure you discuss any questions you have with your health care provider. Document Revised: 12/08/2020 Document Reviewed: 12/08/2020 Elsevier Patient Education  [place].

## 2022-02-12 NOTE — Progress Notes (Signed)
I connected with  Daniel Chung on 02/12/22 by a audio enabled telemedicine application and verified that I am speaking with the correct person using two identifiers.  Patient Location: Home  Provider Location: Office/Clinic  I discussed the limitations of evaluation and management by telemedicine. The patient expressed understanding and agreed to proceed.   Subjective:   Daniel Chung is a 66 y.o. male who presents for Medicare Annual/Subsequent preventive examination.  Review of Systems     Cardiac Risk Factors include: diabetes mellitus;dyslipidemia;hypertension;male gender;smoking/ tobacco exposure     Objective:    There were no vitals filed for this visit. There is no height or weight on file to calculate BMI.     02/12/2022    1:14 PM 01/25/2022    7:27 AM 11/19/2020    3:13 PM  Advanced Directives  Does Patient Have a Medical Advance Directive? No No No  Would patient like information on creating a medical advance directive? Yes (ED - Information included in AVS)  No - Patient declined    Current Medications (verified) Outpatient Encounter Medications as of 02/12/2022  Medication Sig   amLODipine (NORVASC) 10 MG tablet Take 1 tablet by mouth once daily   aspirin 81 MG EC tablet Take 1 tablet (81 mg total) by mouth daily. Swallow whole.   atorvastatin (LIPITOR) 40 MG tablet Take 1 tablet by mouth once daily   Calcium Carbonate Antacid (TUMS CHEWY BITES PO) Take 1 tablet by mouth daily as needed (heartburn or indigestion).   lisinopril (ZESTRIL) 40 MG tablet Take 1 tablet (40 mg total) by mouth daily.   PROVENTIL HFA 108 (90 Base) MCG/ACT inhaler INHALE 2 PUFFS BY MOUTH EVERY 6 HOURS AS NEEDED FOR WHEEZING OR SHORTNESS OF BREATH   No facility-administered encounter medications on file as of 02/12/2022.    Allergies (verified) Patient has no known allergies.   History: Past Medical History:  Diagnosis Date   Anginal pain ([place])    Arthritis    Emphysema lung (HCC)     GERD (gastroesophageal reflux disease)    Hyperlipidemia    Hypertension    Peripheral neuropathy    Tinnitus    Past Surgical History:  Procedure Laterality Date   COLONOSCOPY WITH PROPOFOL N/A 01/25/2022   Procedure: COLONOSCOPY WITH PROPOFOL;  Surgeon: Eloise Harman, DO;  [facility];  Service: Endoscopy;  Laterality: N/A;  9:15 / ASA 3   ELBOW SURGERY Left 1991   for tennis elbow   FEMORAL-POPLITEAL BYPASS GRAFT Left 2011   [place], [place]   POLYPECTOMY  01/25/2022   Procedure: POLYPECTOMY;  Surgeon: Eloise Harman, DO;  [facility];  Service: Endoscopy;;   TONSILLECTOMY  age 67   VASECTOMY     Family History  Problem Relation Age of Onset   Scleroderma Mother    Diabetes Father    Hypertension Father    Cancer Sister    COPD Sister    Cancer Brother    Social History   Socioeconomic History   Marital status: Divorced    Spouse name: Not on file   Number of children: 3   Years of education: Not on file   Highest education level: Associate degree: occupational, Hotel manager, or vocational program  Occupational History    Comment: Retired   Tobacco Use   Smoking status: Former    Packs/day: 1.00    Years: 50.00    Total pack years: 50.00    Types: Cigarettes    Quit date:  03/07/2018    Years since quitting: 3.9   Smokeless tobacco: Never  Vaping Use   Vaping Use: Never used  Substance and Sexual Activity   Alcohol use: Yes    Comment: rare social   Drug use: Yes    Frequency: 2.0 times per week    Types: Marijuana, PCP    Comment: per week  last smoked on 01/19/22   Sexual activity: Yes  Other Topics Concern   Not on file  Social History Narrative   Lives alone   3 children: 2 in [place], 1 in [place]      Enjoys: casino, auto racing-short tracks      Diet: lunch is biggest meal, eats all food groups   Caffeine: none really    Water: 4 16 oz bottles      Wears seat belt   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     Social Determinants of Health   Financial Resource Strain: Low Risk  (02/12/2022)   Overall Financial Resource Strain (CARDIA)    Difficulty of Paying Living Expenses: Not hard at all  Food Insecurity: No Food Insecurity (02/12/2022)   Hunger Vital Sign    Worried About Running Out of Food in the Last Year: Never true    [place] in the Last Year: Never true  Transportation Needs: No Transportation Needs (02/12/2022)   PRAPARE - Hydrologist (Medical): No    Lack of Transportation (Non-Medical): No  Physical Activity: Insufficiently Active (02/12/2022)   Exercise Vital Sign    Days of Exercise per Week: 7 days    Minutes of Exercise per Session: 20 min  Stress: No Stress Concern Present (11/19/2020)   [place]    Feeling of Stress : Not at all  Social Connections: Socially Isolated (02/12/2022)   Social Connection and Isolation Panel [NHANES]    Frequency of Communication with Friends and Family: More than three times a week    Frequency of Social Gatherings with Friends and Family: More than three times a week    Attends Religious Services: Never    Marine scientist or Organizations: No    Attends Music therapist: Never    Marital Status: Divorced    Tobacco Counseling Counseling given: Not Answered   Clinical Intake:  Pre-visit preparation completed: Yes        Diabetes: Yes CBG done?: No Did pt. bring in CBG monitor from home?: No  How often do you need to have someone help you when you read instructions, pamphlets, or other written materials from your doctor or pharmacy?: 1 - Never  Diabetic?Nutrition Risk Assessment:  Has the patient had any N/V/D within the last 2 months?  No  Does the patient have any non-healing wounds?  No  Has the patient had any unintentional weight loss or weight gain?  No   Diabetes:  Is the patient diabetic?   Yes  If diabetic, was a CBG obtained today?  No  Did the patient bring in their glucometer from home?  No  How often do you monitor your CBG's? Doesn't check it at home .   Financial Strains and Diabetes Management:  Are you having any financial strains with the device, your supplies or your medication? No .  Does the patient want to be seen by Chronic Care Management for management of their diabetes?  No  Would  the patient like to be referred to a Nutritionist or for Diabetic Management?  No   Diabetic Exams:  Diabetic Eye Exam: Completed   Diabetic Foot Exam: Completed             Activities of Daily Living    02/12/2022    1:19 PM 01/21/2022   10:24 AM  In your present state of health, do you have any difficulty performing the following activities:  Hearing? 0 0  Vision? 0 0  Difficulty concentrating or making decisions? 0 0  Walking or climbing stairs? 0 1  Dressing or bathing? 0 0  Doing errands, shopping? 0   Preparing Food and eating ? N   Using the Toilet? N   In the past six months, have you accidently leaked urine? N   Do you have problems with loss of bowel control? N   Managing your Medications? N   Managing your Finances? N   Housekeeping or managing your Housekeeping? N     Patient Care Team: Lindell Spar, MD as PCP - General (Internal Medicine)  Indicate any recent Medical Services you may have received from other than [facility] providers in the past year (date may be approximate).     Assessment:   This is a routine wellness examination for [place].  Hearing/Vision screen No results found.  Dietary issues and exercise activities discussed: Current Exercise Habits: The patient has a physically strenuous job, but has no regular exercise apart from work., Exercise limited by: respiratory conditions(s)   Goals Addressed             This Visit's Progress    Patient Stated       Get follow up low dose chest scan Sept 1 or soon after        Depression Screen    02/12/2022    1:17 PM 09/14/2021    9:58 AM 05/14/2021    9:57 AM 01/12/2021   11:03 AM 11/19/2020    3:16 PM 08/12/2020   10:01 AM 04/09/2020    9:47 AM  PHQ 2/9 Scores  PHQ - 2 Score 0 0 0 0 0 0 0    Fall Risk    02/12/2022    1:18 PM 09/14/2021    9:57 AM 05/14/2021    9:57 AM 01/12/2021   11:03 AM 11/19/2020    3:15 PM  Fall Risk   Falls in the past year? 0 0 0 0 0  Number falls in past yr: 0 0 0 0 0  Injury with Fall? 0 0 0 0 0  Risk for fall due to :  No Fall Risks No Fall Risks No Fall Risks No Fall Risks  Follow up  Falls evaluation completed Falls evaluation completed Falls evaluation completed Falls evaluation completed;Falls prevention discussed    FALL RISK PREVENTION PERTAINING TO THE HOME:  Any stairs in or around the home? No  If so, are there any without handrails? No  Home free of loose throw rugs in walkways, pet beds, electrical cords, etc? Yes  Adequate lighting in your home to reduce risk of falls? Yes   ASSISTIVE DEVICES UTILIZED TO PREVENT FALLS:  Life alert? No  Use of a cane, walker or w/c? No  Grab bars in the bathroom? Yes  Shower chair or bench in shower? No  Elevated toilet seat or a handicapped toilet? No     Cognitive Function:        02/12/2022    1:22 PM  6CIT Screen  What Year? 0 points  What month? 0 points  What time? 0 points  Count back from 20 0 points  Months in reverse 0 points  Repeat phrase 0 points  Total Score 0 points    Immunizations  There is no immunization history on file for this patient.  TDAP status: Due, Education has been provided regarding the importance of this vaccine. Advised may receive this vaccine at local pharmacy or Health Dept. Aware to provide a copy of the vaccination record if obtained from local pharmacy or Health Dept. Verbalized acceptance and understanding.  Flu Vaccine status: Up to date  Pneumococcal vaccine status: Due, Education has been provided regarding the  importance of this vaccine. Advised may receive this vaccine at local pharmacy or Health Dept. Aware to provide a copy of the vaccination record if obtained from local pharmacy or Health Dept. Verbalized acceptance and understanding.  Covid-19 vaccine status: Completed vaccines  Qualifies for Shingles Vaccine? Yes   Zostavax completed No   Shingrix Completed?: No.    Education has been provided regarding the importance of this vaccine. Patient has been advised to call insurance company to determine out of pocket expense if they have not yet received this vaccine. Advised may also receive vaccine at local pharmacy or Health Dept. Verbalized acceptance and understanding.  Screening Tests Health Maintenance  Topic Date Due   COVID-19 Vaccine (1) Never done   TETANUS/TDAP  Never done   Zoster Vaccines- Shingrix (1 of 2) Never done   Pneumonia Vaccine 17+ Years old (1 - PCV) Never done   INFLUENZA VACCINE  03/02/2022   HEMOGLOBIN A1C  03/14/2022   OPHTHALMOLOGY EXAM  05/28/2022   FOOT EXAM  09/14/2022   Fecal DNA (Cologuard)  09/30/2024   Hepatitis C Screening  Completed   HPV VACCINES  Aged Out   COLONOSCOPY (Pts 45-59yr Insurance coverage will need to be confirmed)  Discontinued    Health Maintenance  Health Maintenance Due  Topic Date Due   COVID-19 Vaccine (1) Never done   TETANUS/TDAP  Never done   Zoster Vaccines- Shingrix (1 of 2) Never done   Pneumonia Vaccine 67 Years old (1 - PCV) Never done    Colorectal cancer screening: Type of screening: Colonoscopy. Completed 10. Repeat every 10 years cologuard in march 2023  Lung Cancer Screening: (Low Dose CT Chest recommended if Age 67-80years, 30 pack-year currently smoking OR have quit w/in 15years.) does qualify.   Lung Cancer Screening Referral: need low dose follow up chest scan in Sept   Additional Screening:  Hepatitis C Screening: does qualify; Completed   Vision Screening: Recommended annual ophthalmology exams  for early detection of glaucoma and other disorders of the eye. Is the patient up to date with their annual eye exam?  Yes  Who is the provider or what is the name of the office in which the patient attends annual eye exams? Retinal screening in [facility] If pt is not established with a provider, would they like to be referred to a provider to establish care? No .   Dental Screening: Recommended annual dental exams for proper oral hygiene  Community Resource Referral / Chronic Care Management: CRR required this visit?  No   CCM required this visit?  No      Plan:     I have personally reviewed and noted the following in the patient's chart:   Medical and social history Use of alcohol, tobacco or illicit drugs  Current  medications and supplements including opioid prescriptions. Patient is not currently taking opioid prescriptions. Functional ability and status Nutritional status Physical activity Advanced directives List of other physicians Hospitalizations, surgeries, and ER visits in previous 12 months Vitals Screenings to include cognitive, depression, and falls Referrals and appointments  In addition, I have reviewed and discussed with patient certain preventive protocols, quality metrics, and best practice recommendations. A written personalized care plan for preventive services as well as general preventive health recommendations were provided to patient.     Eual Fines, LPN   3/96/7289   Nurse Notes: Schedule your next wellness visit for 1 year at checkout

## 2022-03-15 ENCOUNTER — Ambulatory Visit (INDEPENDENT_AMBULATORY_CARE_PROVIDER_SITE_OTHER): Payer: Medicare Other | Admitting: Internal Medicine

## 2022-03-15 ENCOUNTER — Encounter: Payer: Self-pay | Admitting: Internal Medicine

## 2022-03-15 VITALS — BP 132/82 | HR 85 | Resp 18 | Ht 71.0 in | Wt 186.0 lb

## 2022-03-15 DIAGNOSIS — I7143 Infrarenal abdominal aortic aneurysm, without rupture: Secondary | ICD-10-CM

## 2022-03-15 DIAGNOSIS — I10 Essential (primary) hypertension: Secondary | ICD-10-CM

## 2022-03-15 DIAGNOSIS — E1169 Type 2 diabetes mellitus with other specified complication: Secondary | ICD-10-CM

## 2022-03-15 DIAGNOSIS — I700 Atherosclerosis of aorta: Secondary | ICD-10-CM

## 2022-03-15 DIAGNOSIS — F17211 Nicotine dependence, cigarettes, in remission: Secondary | ICD-10-CM

## 2022-03-15 DIAGNOSIS — Z2821 Immunization not carried out because of patient refusal: Secondary | ICD-10-CM

## 2022-03-15 DIAGNOSIS — E782 Mixed hyperlipidemia: Secondary | ICD-10-CM

## 2022-03-15 DIAGNOSIS — R918 Other nonspecific abnormal finding of lung field: Secondary | ICD-10-CM

## 2022-03-15 NOTE — Progress Notes (Signed)
Established Patient Office Visit  Subjective:  Patient ID: Daniel Chung, male    DOB: May 10, 1956  Age: 66 y.o. MRN: [id number]  CC:  Chief Complaint  Patient presents with   Follow-up    6 month follow up DM and HTN in July pt grandson stayed with him and he broke out in rash on top of his feet when he came back at the end of July the bottom of his feet were peeling     HPI Daniel Chung is a 66 y.o. male with past medical history of HTN, HLD, type 2 DM and GERD who presents for f/u of his chronic medical conditions.  HTN: BP is well-controlled. Takes medications regularly. Patient denies headache, dizziness, chest pain, dyspnea or palpitations.   Type II DM with HLD: His HbA1c was stable at 6.5 in the last visit.  He has been following strict low-carb diet, and still does not want to take any medication for it.  Denies any fatigue, polyuria or polydipsia currently.  He reports having skin peeling over b/l feet while he was traveling to [place].  Of note, he was staying at motels and was using different soaps, which could have provoked contact dermatitis.  He tried using cortisone cream which had resolved his rash.     Past Medical History:  Diagnosis Date   Anginal pain ([place])    Arthritis    Emphysema lung (HCC)    GERD (gastroesophageal reflux disease)    Hyperlipidemia    Hypertension    Peripheral neuropathy    Tinnitus     Past Surgical History:  Procedure Laterality Date   COLONOSCOPY WITH PROPOFOL N/A 01/25/2022   Procedure: COLONOSCOPY WITH PROPOFOL;  Surgeon: Eloise Harman, DO;  [facility];  Service: Endoscopy;  Laterality: N/A;  9:15 / ASA 3   ELBOW SURGERY Left 1991   for tennis elbow   FEMORAL-POPLITEAL BYPASS GRAFT Left 2011   [place], [place]   POLYPECTOMY  01/25/2022   Procedure: POLYPECTOMY;  Surgeon: Eloise Harman, DO;  [facility];  Service: Endoscopy;;   TONSILLECTOMY  age 68   VASECTOMY      Family History  Problem  Relation Age of Onset   Scleroderma Mother    Diabetes Father    Hypertension Father    Cancer Sister    COPD Sister    Cancer Brother     Social History   Socioeconomic History   Marital status: Divorced    Spouse name: Not on file   Number of children: 3   Years of education: Not on file   Highest education level: Associate degree: occupational, Hotel manager, or vocational program  Occupational History    Comment: Retired   Tobacco Use   Smoking status: Former    Packs/day: 1.00    Years: 50.00    Total pack years: 50.00    Types: Cigarettes    Quit date: 03/07/2018    Years since quitting: 4.0   Smokeless tobacco: Never  Vaping Use   Vaping Use: Never used  Substance and Sexual Activity   Alcohol use: Yes    Comment: rare social   Drug use: Yes    Frequency: 2.0 times per week    Types: Marijuana, PCP    Comment: per week  last smoked on 01/19/22   Sexual activity: Yes  Other Topics Concern   Not on file  Social History Narrative   Lives alone   3 children: 2 in  [place], 1 in [place]      Enjoys: casino, auto racing-short tracks      Diet: lunch is biggest meal, eats all food groups   Caffeine: none really    Water: 4 16 oz bottles      Wears seat belt   Does not use phone while drinking   Smoke detectors    Social Determinants of Health   Financial Resource Strain: Low Risk  (02/12/2022)   Overall Financial Resource Strain (CARDIA)    Difficulty of Paying Living Expenses: Not hard at all  Food Insecurity: No Food Insecurity (02/12/2022)   Hunger Vital Sign    Worried About Running Out of Food in the Last Year: Never true    Ran Out of Food in the Last Year: Never true  Transportation Needs: No Transportation Needs (02/12/2022)   PRAPARE - Hydrologist (Medical): No    Lack of Transportation (Non-Medical): No  Physical Activity: Insufficiently Active (02/12/2022)   Exercise Vital Sign    Days of Exercise per Week: 7 days    Minutes of  Exercise per Session: 20 min  Stress: No Stress Concern Present (11/19/2020)   [place]    Feeling of Stress : Not at all  Social Connections: Socially Isolated (02/12/2022)   Social Connection and Isolation Panel [NHANES]    Frequency of Communication with Friends and Family: More than three times a week    Frequency of Social Gatherings with Friends and Family: More than three times a week    Attends Religious Services: Never    Marine scientist or Organizations: No    Attends Archivist Meetings: Never    Marital Status: Divorced  Human resources officer Violence: Not At Risk (11/19/2020)   Humiliation, Afraid, Rape, and Kick questionnaire    Fear of Current or Ex-Partner: No    Emotionally Abused: No    Physically Abused: No    Sexually Abused: No    Outpatient Medications Prior to Visit  Medication Sig Dispense Refill   amLODipine (NORVASC) 10 MG tablet Take 1 tablet by mouth once daily 90 tablet 0   aspirin 81 MG EC tablet Take 1 tablet (81 mg total) by mouth daily. Swallow whole. 30 tablet 12   atorvastatin (LIPITOR) 40 MG tablet Take 1 tablet by mouth once daily 90 tablet 0   Calcium Carbonate Antacid (TUMS CHEWY BITES PO) Take 1 tablet by mouth daily as needed (heartburn or indigestion).     lisinopril (ZESTRIL) 40 MG tablet Take 1 tablet (40 mg total) by mouth daily. 90 tablet 1   PROVENTIL HFA 108 (90 Base) MCG/ACT inhaler INHALE 2 PUFFS BY MOUTH EVERY 6 HOURS AS NEEDED FOR WHEEZING OR SHORTNESS OF BREATH 20.1 g 2   No facility-administered medications prior to visit.    No Known Allergies  ROS Review of Systems  Constitutional:  Negative for chills and fever.  HENT:  Negative for congestion and sore throat.   Eyes:  Negative for pain and discharge.  Respiratory:  Negative for cough and shortness of breath.   Cardiovascular:  Negative for chest pain and palpitations.  Gastrointestinal:   Negative for constipation, diarrhea, nausea and vomiting.  Endocrine: Negative for polydipsia and polyuria.  Genitourinary:  Negative for dysuria and hematuria.  Musculoskeletal:  Negative for neck pain and neck stiffness.  Skin:  Negative for rash.  Neurological:  Negative for dizziness, weakness, numbness and headaches.  Psychiatric/Behavioral:  Negative for agitation and behavioral problems.       Objective:    Physical Exam Vitals reviewed.  Constitutional:      General: He is not in acute distress.    Appearance: He is not diaphoretic.  HENT:     Head: Normocephalic and atraumatic.     Nose: Nose normal.     Mouth/Throat:     Mouth: Mucous membranes are moist.  Eyes:     General: No scleral icterus.    Extraocular Movements: Extraocular movements intact.  Cardiovascular:     Rate and Rhythm: Normal rate and regular rhythm.     Pulses: Normal pulses.     Heart sounds: Normal heart sounds. No murmur heard. Pulmonary:     Breath sounds: Normal breath sounds. No wheezing or rales.  Musculoskeletal:     Cervical back: Neck supple. No tenderness.     Right lower leg: No edema.     Left lower leg: No edema.  Skin:    General: Skin is warm.     Findings: No rash.  Neurological:     General: No focal deficit present.     Mental Status: He is alert and oriented to person, place, and time.     Sensory: No sensory deficit.     Motor: No weakness.  Psychiatric:        Mood and Affect: Mood normal.        Behavior: Behavior normal.     BP 132/82 (BP Location: Right Arm, Patient Position: Sitting, Cuff Size: Normal)   Pulse 85   Resp 18   Ht _0  (1.803 m)   Wt 186 lb (84.4 kg)   SpO2 95%   BMI 25.94 kg/m  Wt Readings from Last 3 Encounters:  03/15/22 186 lb (84.4 kg)  01/25/22 186 lb 15.2 oz (84.8 kg)  01/21/22 187 lb (84.8 kg)    Lab Results  Component Value Date   TSH 1.630 05/07/2021   Lab Results  Component Value Date   WBC 11.2 (H) 05/07/2021   HGB  16.8 05/07/2021   HCT 48.1 05/07/2021   MCV 91 05/07/2021   PLT 227 05/07/2021   Lab Results  Component Value Date   NA 133 (L) 05/07/2021   K 4.5 05/07/2021   CO2 20 05/07/2021   GLUCOSE 110 (H) 05/07/2021   BUN 13 05/07/2021   CREATININE 0.92 05/07/2021   BILITOT 0.5 05/07/2021   ALKPHOS 113 05/07/2021   AST 17 05/07/2021   ALT 18 05/07/2021   PROT 6.9 05/07/2021   ALBUMIN 4.5 05/07/2021   CALCIUM 9.9 05/07/2021   ANIONGAP 9 06/07/2019   EGFR 92 05/07/2021   Lab Results  Component Value Date   CHOL 180 05/07/2021   Lab Results  Component Value Date   HDL 31 (L) 05/07/2021   Lab Results  Component Value Date   LDLCALC 118 (H) 05/07/2021   Lab Results  Component Value Date   TRIG 175 (H) 05/07/2021   Lab Results  Component Value Date   CHOLHDL 5.8 (H) 05/07/2021   Lab Results  Component Value Date   HGBA1C 6.5 09/14/2021      Assessment & Plan:   Problem List Items Addressed This Visit       Cardiovascular and Mediastinum   Essential hypertension    BP Readings from Last 1 Encounters:  03/15/22 132/82  Well-controlled with Amlodipine and Lisinopril Counseled for compliance with the medications Advised DASH diet and moderate  exercise/walking, at least 150 mins/week      Aortic atherosclerosis ([place])    Noted on CT chest BP WNL On Lipitor      AAA (abdominal aortic aneurysm) (HCC)    Low dose CT chest (06/23) - Aneurysmal dilatation of the infrarenal abdominal aorta measuring 3.1 x 3.0 cm. Recommend follow-up ultrasound every 3 years.        Respiratory   Pulmonary nodules/lesions, multiple    Noted on CT chest Quit smoking in 2019 Checks CT chest LCS nodule follow-up for stability      Relevant Orders   CT CHEST LCS NODULE F/U LOW DOSE WO CONTRAST     Endocrine   Type 2 diabetes mellitus with other specified complication (HCC) - Primary    Lab Results  Component Value Date   HGBA1C 6.5 09/14/2021  Had discussed about starting  metformin, patient wants to follow strict low-carb diet for now and will start metformin if HbA1C persistently elevated  Associated with HLD and HTN Advised to follow diabetic diet On statin and ACEi F/u CMP and lipid panel Diabetic eye exam: Advised to follow up with Ophthalmology for diabetic eye exam      Relevant Orders   Microalbumin / creatinine urine ratio   CMP14+EGFR   Hemoglobin A1c     Other   Hyperlipidemia    Check lipid profile Continue atorvastatin 40 mg daily Needs to comply with DASH diet      Relevant Orders   Lipid Profile   Cigarette nicotine dependence in remission   Relevant Orders   CT CHEST LCS NODULE F/U LOW DOSE WO CONTRAST   Other Visit Diagnoses     Refused pneumococcal vaccination           No orders of the defined types were placed in this encounter.   Follow-up: Return in about 6 months (around 09/15/2022) for DM and HTN.    Lindell Spar, MD

## 2022-03-15 NOTE — Assessment & Plan Note (Signed)
Noted on CT chest BP WNL On Lipitor 

## 2022-03-15 NOTE — Patient Instructions (Signed)
Please continue taking medications as prescribed.  Please continue to follow low carb diet and perform moderate exercise/walking at least 150 mins/week. 

## 2022-03-15 NOTE — Assessment & Plan Note (Signed)
Low dose CT chest (06/23) - Aneurysmal dilatation of the infrarenal abdominal aorta measuring 3.1 x 3.0 cm. Recommend follow-up ultrasound every 3 years. 

## 2022-03-15 NOTE — Assessment & Plan Note (Signed)
Check lipid profile Continue atorvastatin 40 mg daily Needs to comply with DASH diet 

## 2022-03-15 NOTE — Assessment & Plan Note (Addendum)
Lab Results  Component Value Date   HGBA1C 6.5 09/14/2021   Had discussed about starting metformin, patient wants to follow strict low-carb diet for now and will start metformin if HbA1C persistently elevated  Associated with HLD and HTN Advised to follow diabetic diet On statin and ACEi F/u CMP and lipid panel Diabetic eye exam: Advised to follow up with Ophthalmology for diabetic eye exam

## 2022-03-15 NOTE — Assessment & Plan Note (Addendum)
Noted on CT chest Quit smoking in 2019 Checks CT chest LCS nodule follow-up for stability

## 2022-03-15 NOTE — Assessment & Plan Note (Signed)
BP Readings from Last 1 Encounters:  03/15/22 132/82   Well-controlled with Amlodipine and Lisinopril Counseled for compliance with the medications Advised DASH diet and moderate exercise/walking, at least 150 mins/week

## 2022-03-16 ENCOUNTER — Telehealth: Payer: Self-pay | Admitting: Internal Medicine

## 2022-03-16 ENCOUNTER — Telehealth: Payer: Self-pay

## 2022-03-16 LAB — CMP14+EGFR
ALT: 17 IU/L (ref 0–44)
AST: 18 IU/L (ref 0–40)
Albumin/Globulin Ratio: 1.9 (ref 1.2–2.2)
Albumin: 4.9 g/dL (ref 3.9–4.9)
Alkaline Phosphatase: 109 IU/L (ref 44–121)
BUN/Creatinine Ratio: 15 (ref 10–24)
BUN: 16 mg/dL (ref 8–27)
Bilirubin Total: 0.5 mg/dL (ref 0.0–1.2)
CO2: 20 mmol/L (ref 20–29)
Calcium: 10.2 mg/dL (ref 8.6–10.2)
Chloride: 99 mmol/L (ref 96–106)
Creatinine, Ser: 1.06 mg/dL (ref 0.76–1.27)
Globulin, Total: 2.6 g/dL (ref 1.5–4.5)
Glucose: 108 mg/dL — ABNORMAL HIGH (ref 70–99)
Potassium: 5.1 mmol/L (ref 3.5–5.2)
Sodium: 135 mmol/L (ref 134–144)
Total Protein: 7.5 g/dL (ref 6.0–8.5)
eGFR: 77 mL/min/{1.73_m2} (ref >59)

## 2022-03-16 LAB — LIPID PANEL
Chol/HDL Ratio: 5.6 ratio — ABNORMAL HIGH (ref 0.0–5.0)
Cholesterol, Total: 179 mg/dL (ref 100–199)
HDL: 32 mg/dL — ABNORMAL LOW
LDL Chol Calc (NIH): 112 mg/dL — ABNORMAL HIGH (ref 0–99)
Triglycerides: 197 mg/dL — ABNORMAL HIGH (ref 0–149)
VLDL Cholesterol Cal: 35 mg/dL (ref 5–40)

## 2022-03-16 LAB — HEMOGLOBIN A1C
Est. average glucose Bld gHb Est-mCnc: 134 mg/dL
Hgb A1c MFr Bld: 6.3 % — ABNORMAL HIGH (ref 4.8–5.6)

## 2022-03-16 NOTE — Telephone Encounter (Signed)
Pt returning call for labs, his phone will not ring when he receives a call

## 2022-03-16 NOTE — Telephone Encounter (Signed)
Patient returning call for lab results. 

## 2022-03-16 NOTE — Telephone Encounter (Signed)
LVM for pt to call the office regarding lab results if phone not ringing please have him hold until someone can speak with him

## 2022-03-16 NOTE — Telephone Encounter (Signed)
LVM for pt to call the office.

## 2022-03-17 LAB — MICROALBUMIN / CREATININE URINE RATIO
Creatinine, Urine: 84.8 mg/dL
Microalb/Creat Ratio: 8 mg/g creat (ref 0–29)
Microalbumin, Urine: 6.4 ug/mL

## 2022-03-29 ENCOUNTER — Other Ambulatory Visit: Payer: Self-pay | Admitting: Internal Medicine

## 2022-03-29 DIAGNOSIS — E782 Mixed hyperlipidemia: Secondary | ICD-10-CM

## 2022-04-05 ENCOUNTER — Other Ambulatory Visit: Payer: Self-pay | Admitting: Internal Medicine

## 2022-04-05 DIAGNOSIS — I10 Essential (primary) hypertension: Secondary | ICD-10-CM

## 2022-04-21 ENCOUNTER — Ambulatory Visit (HOSPITAL_COMMUNITY)
Admission: RE | Admit: 2022-04-21 | Discharge: 2022-04-21 | Disposition: A | Discharge status: Home / Self Care | Payer: Medicare Other | Source: Ambulatory Visit | Attending: Internal Medicine | Admitting: Internal Medicine

## 2022-04-21 DIAGNOSIS — R918 Other nonspecific abnormal finding of lung field: Secondary | ICD-10-CM | POA: Diagnosis present

## 2022-04-21 DIAGNOSIS — F17211 Nicotine dependence, cigarettes, in remission: Secondary | ICD-10-CM | POA: Diagnosis present

## 2022-04-26 ENCOUNTER — Other Ambulatory Visit: Payer: Self-pay | Admitting: Internal Medicine

## 2022-04-26 DIAGNOSIS — I10 Essential (primary) hypertension: Secondary | ICD-10-CM

## 2022-06-02 ENCOUNTER — Other Ambulatory Visit: Payer: Self-pay | Admitting: Internal Medicine

## 2022-06-02 DIAGNOSIS — E782 Mixed hyperlipidemia: Secondary | ICD-10-CM

## 2022-06-26 ENCOUNTER — Other Ambulatory Visit: Payer: Self-pay | Admitting: Internal Medicine

## 2022-06-26 DIAGNOSIS — I10 Essential (primary) hypertension: Secondary | ICD-10-CM

## 2022-07-21 ENCOUNTER — Other Ambulatory Visit: Payer: Self-pay | Admitting: Internal Medicine

## 2022-07-21 DIAGNOSIS — I10 Essential (primary) hypertension: Secondary | ICD-10-CM

## 2022-09-07 ENCOUNTER — Other Ambulatory Visit (HOSPITAL_COMMUNITY)
Admission: RE | Admit: 2022-09-07 | Discharge: 2022-09-07 | Disposition: A | Discharge status: Home / Self Care | Payer: Medicare Other | Attending: Internal Medicine | Admitting: Internal Medicine

## 2022-09-07 DIAGNOSIS — E782 Mixed hyperlipidemia: Secondary | ICD-10-CM | POA: Insufficient documentation

## 2022-09-07 DIAGNOSIS — E559 Vitamin D deficiency, unspecified: Secondary | ICD-10-CM | POA: Diagnosis not present

## 2022-09-07 DIAGNOSIS — J449 Chronic obstructive pulmonary disease, unspecified: Secondary | ICD-10-CM | POA: Diagnosis not present

## 2022-09-07 DIAGNOSIS — I10 Essential (primary) hypertension: Secondary | ICD-10-CM | POA: Insufficient documentation

## 2022-09-07 DIAGNOSIS — Z1329 Encounter for screening for other suspected endocrine disorder: Secondary | ICD-10-CM | POA: Diagnosis not present

## 2022-09-07 DIAGNOSIS — E1169 Type 2 diabetes mellitus with other specified complication: Secondary | ICD-10-CM | POA: Insufficient documentation

## 2022-09-07 DIAGNOSIS — N400 Enlarged prostate without lower urinary tract symptoms: Secondary | ICD-10-CM | POA: Insufficient documentation

## 2022-09-07 LAB — COMPREHENSIVE METABOLIC PANEL
ALT: 23 U/L (ref 0–44)
AST: 21 U/L (ref 15–41)
Albumin: 4.4 g/dL (ref 3.5–5.0)
Alkaline Phosphatase: 90 U/L (ref 38–126)
Anion gap: 10 (ref 5–15)
BUN: 22 mg/dL (ref 8–23)
CO2: 23 mmol/L (ref 22–32)
Calcium: 9.7 mg/dL (ref 8.9–10.3)
Chloride: 101 mmol/L (ref 98–111)
Creatinine, Ser: 1 mg/dL (ref 0.61–1.24)
GFR, Estimated: >60 mL/min (ref >60)
Glucose, Bld: 111 mg/dL — ABNORMAL HIGH (ref 70–99)
Potassium: 4.3 mmol/L (ref 3.5–5.1)
Sodium: 134 mmol/L — ABNORMAL LOW (ref 135–145)
Total Bilirubin: 0.8 mg/dL (ref 0.3–1.2)
Total Protein: 7.5 g/dL (ref 6.5–8.1)

## 2022-09-07 LAB — CBC WITH DIFFERENTIAL/PLATELET
Abs Immature Granulocytes: 0.02 10*3/uL (ref 0.00–0.07)
Basophils Absolute: 0 10*3/uL (ref 0.0–0.1)
Basophils Relative: 0 %
Eosinophils Absolute: 0.2 10*3/uL (ref 0.0–0.5)
Eosinophils Relative: 2 %
HCT: 45.7 % (ref 39.0–52.0)
Hemoglobin: 15.7 g/dL (ref 13.0–17.0)
Immature Granulocytes: 0 %
Lymphocytes Relative: 34 %
Lymphs Abs: 3.2 10*3/uL (ref 0.7–4.0)
MCH: 31.8 pg (ref 26.0–34.0)
MCHC: 34.4 g/dL (ref 30.0–36.0)
MCV: 92.5 fL (ref 80.0–100.0)
Monocytes Absolute: 0.7 10*3/uL (ref 0.1–1.0)
Monocytes Relative: 7 %
Neutro Abs: 5.3 10*3/uL (ref 1.7–7.7)
Neutrophils Relative %: 57 %
Platelets: 215 10*3/uL (ref 150–400)
RBC: 4.94 MIL/uL (ref 4.22–5.81)
RDW: 12.9 % (ref 11.5–15.5)
WBC: 9.4 10*3/uL (ref 4.0–10.5)
nRBC: 0 % (ref 0.0–0.2)

## 2022-09-07 LAB — LIPID PANEL
Cholesterol: 166 mg/dL (ref 0–200)
HDL: 32 mg/dL — ABNORMAL LOW (ref >40)
LDL Cholesterol: 114 mg/dL — ABNORMAL HIGH (ref 0–99)
Total CHOL/HDL Ratio: 5.2 RATIO
Triglycerides: 102 mg/dL (ref <150)
VLDL: 20 mg/dL (ref 0–40)

## 2022-09-07 LAB — HEMOGLOBIN A1C
Hgb A1c MFr Bld: 6.1 % — ABNORMAL HIGH (ref 4.8–5.6)
Mean Plasma Glucose: 128.37 mg/dL

## 2022-09-07 LAB — TSH: TSH: 1.26 u[IU]/mL (ref 0.350–4.500)

## 2022-09-07 LAB — PSA: Prostatic Specific Antigen: 2.1 ng/mL (ref 0.00–4.00)

## 2022-09-08 LAB — MISC LABCORP TEST (SEND OUT): Labcorp test code: 81950

## 2022-09-16 ENCOUNTER — Encounter: Payer: Self-pay | Admitting: Internal Medicine

## 2022-09-16 ENCOUNTER — Ambulatory Visit (INDEPENDENT_AMBULATORY_CARE_PROVIDER_SITE_OTHER): Payer: Medicare Other | Admitting: Internal Medicine

## 2022-09-16 VITALS — BP 126/74 | HR 82 | Ht 71.0 in | Wt 191.4 lb

## 2022-09-16 DIAGNOSIS — E1169 Type 2 diabetes mellitus with other specified complication: Secondary | ICD-10-CM

## 2022-09-16 DIAGNOSIS — I7143 Infrarenal abdominal aortic aneurysm, without rupture: Secondary | ICD-10-CM | POA: Diagnosis not present

## 2022-09-16 DIAGNOSIS — I10 Essential (primary) hypertension: Secondary | ICD-10-CM

## 2022-09-16 DIAGNOSIS — I700 Atherosclerosis of aorta: Secondary | ICD-10-CM | POA: Diagnosis not present

## 2022-09-16 DIAGNOSIS — R351 Nocturia: Secondary | ICD-10-CM

## 2022-09-16 DIAGNOSIS — I739 Peripheral vascular disease, unspecified: Secondary | ICD-10-CM | POA: Diagnosis not present

## 2022-09-16 DIAGNOSIS — N401 Enlarged prostate with lower urinary tract symptoms: Secondary | ICD-10-CM

## 2022-09-16 DIAGNOSIS — J449 Chronic obstructive pulmonary disease, unspecified: Secondary | ICD-10-CM

## 2022-09-16 DIAGNOSIS — E782 Mixed hyperlipidemia: Secondary | ICD-10-CM

## 2022-09-16 DIAGNOSIS — N400 Enlarged prostate without lower urinary tract symptoms: Secondary | ICD-10-CM

## 2022-09-16 MED ORDER — ATORVASTATIN CALCIUM 80 MG PO TABS: 80.0000 mg | ORAL_TABLET | Freq: Every day | ORAL | 3 refills | Status: DC

## 2022-09-16 MED ORDER — LISINOPRIL 40 MG PO TABS: 40.0000 mg | ORAL_TABLET | Freq: Every day | ORAL | 3 refills | Status: DC

## 2022-09-16 MED ORDER — AMLODIPINE BESYLATE 10 MG PO TABS: 10.0000 mg | ORAL_TABLET | Freq: Every day | ORAL | 3 refills | Status: DC

## 2022-09-16 NOTE — Assessment & Plan Note (Signed)
On aspirin and statin currently Denies claudication symptoms currently

## 2022-09-16 NOTE — Progress Notes (Signed)
Established Patient Office Visit  Subjective:  Patient ID: Daniel Chung, male    DOB: 1955/12/15  Age: 67 y.o. MRN: [id number]  CC:  Chief Complaint  Patient presents with   Hypertension    Six month follow up for hypertension and diabetes    HPI Daniel Chung is a 67 y.o. male with past medical history of HTN, HLD, type 2 DM and GERD who presents for f/u of his chronic medical conditions.  HTN: BP is well-controlled. Takes medications regularly. Patient denies headache, dizziness, chest pain, dyspnea or palpitations.   Type II DM with HLD: His HbA1c was stable at 6.5 in the last visit, but has improved to 6.1 now.  He has been following strict low-carb diet.  Denies any fatigue, polyuria or polydipsia currently.  PAD: He is on aspirin and statin currently.  Denies any foot pain, numbness or pallor of the feet.     Past Medical History:  Diagnosis Date   Anginal pain ([place])    Arthritis    Emphysema lung (HCC)    GERD (gastroesophageal reflux disease)    Hyperlipidemia    Hypertension    Peripheral neuropathy    Tinnitus     Past Surgical History:  Procedure Laterality Date   COLONOSCOPY WITH PROPOFOL N/A 01/25/2022   Procedure: COLONOSCOPY WITH PROPOFOL;  Surgeon: Eloise Harman, DO;  Location: [facility];  Service: Endoscopy;  Laterality: N/A;  9:15 / ASA 3   ELBOW SURGERY Left 1991   for tennis elbow   FEMORAL-POPLITEAL BYPASS GRAFT Left 2011   [place], [place]   POLYPECTOMY  01/25/2022   Procedure: POLYPECTOMY;  Surgeon: Eloise Harman, DO;  Location: [facility];  Service: Endoscopy;;   TONSILLECTOMY  age 89   VASECTOMY      Family History  Problem Relation Age of Onset   Scleroderma Mother    Diabetes Father    Hypertension Father    Cancer Sister    COPD Sister    Cancer Brother     Social History   Socioeconomic History   Marital status: Divorced    Spouse name: Not on file   Number of children: 3   Years of education: Not on file    Highest education level: Associate degree: occupational, Hotel manager, or vocational program  Occupational History    Comment: Retired   Tobacco Use   Smoking status: Former    Packs/day: 1.00    Years: 50.00    Total pack years: 50.00    Types: Cigarettes    Quit date: 03/07/2018    Years since quitting: 4.5   Smokeless tobacco: Never  Vaping Use   Vaping Use: Never used  Substance and Sexual Activity   Alcohol use: Yes    Comment: rare social   Drug use: Yes    Frequency: 2.0 times per week    Types: Marijuana, PCP    Comment: per week  last smoked on 01/19/22   Sexual activity: Yes  Other Topics Concern   Not on file  Social History Narrative   Lives alone   3 children: 2 in [place], 1 in [place]      Enjoys: casino, auto racing-short tracks      Diet: lunch is biggest meal, eats all food groups   Caffeine: none really    Water: 4 16 oz bottles      Wears seat belt   Does not use phone while drinking   Smoke detectors  Social Determinants of Health   Financial Resource Strain: Low Risk  (02/12/2022)   Overall Financial Resource Strain (CARDIA)    Difficulty of Paying Living Expenses: Not hard at all  Food Insecurity: No Food Insecurity (02/12/2022)   Hunger Vital Sign    Worried About Running Out of Food in the Last Year: Never true    Ran Out of Food in the Last Year: Never true  Transportation Needs: No Transportation Needs (02/12/2022)   PRAPARE - Hydrologist (Medical): No    Lack of Transportation (Non-Medical): No  Physical Activity: Insufficiently Active (02/12/2022)   Exercise Vital Sign    Days of Exercise per Week: 7 days    Minutes of Exercise per Session: 20 min  Stress: No Stress Concern Present (11/19/2020)   [place]    Feeling of Stress : Not at all  Social Connections: Socially Isolated (02/12/2022)   Social Connection and Isolation Panel [NHANES]    Frequency  of Communication with Friends and Family: More than three times a week    Frequency of Social Gatherings with Friends and Family: More than three times a week    Attends Religious Services: Never    Marine scientist or Organizations: No    Attends Archivist Meetings: Never    Marital Status: Divorced  Human resources officer Violence: Not At Risk (11/19/2020)   Humiliation, Afraid, Rape, and Kick questionnaire    Fear of Current or Ex-Partner: No    Emotionally Abused: No    Physically Abused: No    Sexually Abused: No    Outpatient Medications Prior to Visit  Medication Sig Dispense Refill   aspirin 81 MG EC tablet Take 1 tablet (81 mg total) by mouth daily. Swallow whole. 30 tablet 12   Calcium Carbonate Antacid (TUMS CHEWY BITES PO) Take 1 tablet by mouth daily as needed (heartburn or indigestion).     PROVENTIL HFA 108 (90 Base) MCG/ACT inhaler INHALE 2 PUFFS BY MOUTH EVERY 6 HOURS AS NEEDED FOR WHEEZING OR SHORTNESS OF BREATH 20.1 g 2   amLODipine (NORVASC) 10 MG tablet Take 1 tablet by mouth once daily 90 tablet 0   atorvastatin (LIPITOR) 40 MG tablet Take 1 tablet by mouth once daily 90 tablet 0   lisinopril (ZESTRIL) 40 MG tablet Take 1 tablet by mouth once daily 90 tablet 0   No facility-administered medications prior to visit.    No Known Allergies  ROS Review of Systems  Constitutional:  Negative for chills and fever.  HENT:  Negative for congestion and sore throat.   Eyes:  Negative for pain and discharge.  Respiratory:  Negative for cough and shortness of breath.   Cardiovascular:  Negative for chest pain and palpitations.  Gastrointestinal:  Negative for constipation, diarrhea, nausea and vomiting.  Endocrine: Negative for polydipsia and polyuria.  Genitourinary:  Negative for dysuria and hematuria.  Musculoskeletal:  Negative for neck pain and neck stiffness.  Skin:  Negative for rash.  Neurological:  Negative for dizziness, weakness, numbness and  headaches.  Psychiatric/Behavioral:  Negative for agitation and behavioral problems.       Objective:    Physical Exam Vitals reviewed.  Constitutional:      General: He is not in acute distress.    Appearance: He is not diaphoretic.  HENT:     Head: Normocephalic and atraumatic.     Nose: Nose normal.  Mouth/Throat:     Mouth: Mucous membranes are moist.  Eyes:     General: No scleral icterus.    Extraocular Movements: Extraocular movements intact.  Cardiovascular:     Rate and Rhythm: Normal rate and regular rhythm.     Pulses: Normal pulses.     Heart sounds: Normal heart sounds. No murmur heard. Pulmonary:     Breath sounds: Normal breath sounds. No wheezing or rales.  Musculoskeletal:     Cervical back: Neck supple. No tenderness.     Right lower leg: No edema.     Left lower leg: No edema.  Skin:    General: Skin is warm.     Findings: No rash.  Neurological:     General: No focal deficit present.     Mental Status: He is alert and oriented to person, place, and time.     Sensory: No sensory deficit.     Motor: No weakness.  Psychiatric:        Mood and Affect: Mood normal.        Behavior: Behavior normal.     BP 126/74 (BP Location: Right Arm, Patient Position: Sitting, Cuff Size: Normal)   Pulse 82   Ht 5' 11"$  (1.803 m)   Wt 191 lb 6.4 oz (86.8 kg)   SpO2 95%   BMI 26.69 kg/m  Wt Readings from Last 3 Encounters:  09/16/22 191 lb 6.4 oz (86.8 kg)  03/15/22 186 lb (84.4 kg)  01/25/22 186 lb 15.2 oz (84.8 kg)    Lab Results  Component Value Date   TSH 1.260 09/07/2022   Lab Results  Component Value Date   WBC 9.4 09/07/2022   HGB 15.7 09/07/2022   HCT 45.7 09/07/2022   MCV 92.5 09/07/2022   PLT 215 09/07/2022   Lab Results  Component Value Date   NA 134 (L) 09/07/2022   K 4.3 09/07/2022   CO2 23 09/07/2022   GLUCOSE 111 (H) 09/07/2022   BUN 22 09/07/2022   CREATININE 1.00 09/07/2022   BILITOT 0.8 09/07/2022   ALKPHOS 90 09/07/2022    AST 21 09/07/2022   ALT 23 09/07/2022   PROT 7.5 09/07/2022   ALBUMIN 4.4 09/07/2022   CALCIUM 9.7 09/07/2022   ANIONGAP 10 09/07/2022   EGFR 77 03/15/2022   Lab Results  Component Value Date   CHOL 166 09/07/2022   Lab Results  Component Value Date   HDL 32 (L) 09/07/2022   Lab Results  Component Value Date   LDLCALC 114 (H) 09/07/2022   Lab Results  Component Value Date   TRIG 102 09/07/2022   Lab Results  Component Value Date   CHOLHDL 5.2 09/07/2022   Lab Results  Component Value Date   HGBA1C 6.1 (H) 09/07/2022      Assessment & Plan:   Problem List Items Addressed This Visit       Cardiovascular and Mediastinum   Essential hypertension    BP Readings from Last 1 Encounters:  09/16/22 126/74  Well-controlled with Amlodipine and Lisinopril Counseled for compliance with the medications Advised DASH diet and moderate exercise/walking, at least 150 mins/week      Relevant Medications   amLODipine (NORVASC) 10 MG tablet   lisinopril (ZESTRIL) 40 MG tablet   atorvastatin (LIPITOR) 80 MG tablet   Peripheral vascular disease (HCC)    On aspirin and statin currently Denies claudication symptoms currently      Relevant Medications   amLODipine (NORVASC) 10 MG tablet  lisinopril (ZESTRIL) 40 MG tablet   atorvastatin (LIPITOR) 80 MG tablet   Aortic atherosclerosis (HCC)    Noted on CT chest BP WNL On Lipitor      Relevant Medications   amLODipine (NORVASC) 10 MG tablet   lisinopril (ZESTRIL) 40 MG tablet   atorvastatin (LIPITOR) 80 MG tablet   AAA (abdominal aortic aneurysm) (HCC)    Low dose CT chest (06/23) - Aneurysmal dilatation of the infrarenal abdominal aorta measuring 3.1 x 3.0 cm. Recommend follow-up ultrasound every 3 years.      Relevant Medications   amLODipine (NORVASC) 10 MG tablet   lisinopril (ZESTRIL) 40 MG tablet   atorvastatin (LIPITOR) 80 MG tablet     Respiratory   Chronic obstructive pulmonary disease (HCC)    Well  controlled with as needed albuterol        Endocrine   Type 2 diabetes mellitus with other specified complication ([place]) - Primary    Lab Results  Component Value Date   HGBA1C 6.1 (H) 09/07/2022   Associated with HLD and HTN Well-controlled with low-carb diet alone Advised to follow diabetic diet On statin and ACEi F/u CMP and lipid panel Diabetic eye exam: Advised to follow up with Ophthalmology for diabetic eye exam      Relevant Medications   lisinopril (ZESTRIL) 40 MG tablet   atorvastatin (LIPITOR) 80 MG tablet   Other Relevant Orders   Hemoglobin A1c     Other   Hyperlipidemia    Check lipid profile Continue atorvastatin 40 mg daily Needs to comply with DASH diet      Relevant Medications   amLODipine (NORVASC) 10 MG tablet   lisinopril (ZESTRIL) 40 MG tablet   atorvastatin (LIPITOR) 80 MG tablet   Other Relevant Orders   Lipid Profile   Benign prostatic hyperplasia with nocturia    Mild nocturia PSA wnl Symptoms better recently       Meds ordered this encounter  Medications   amLODipine (NORVASC) 10 MG tablet    Sig: Take 1 tablet (10 mg total) by mouth daily.    Dispense:  90 tablet    Refill:  3   lisinopril (ZESTRIL) 40 MG tablet    Sig: Take 1 tablet (40 mg total) by mouth daily.    Dispense:  90 tablet    Refill:  3   atorvastatin (LIPITOR) 80 MG tablet    Sig: Take 1 tablet (80 mg total) by mouth daily.    Dispense:  90 tablet    Refill:  3    Dose change    Follow-up: Return in about 6 months (around 03/17/2023) for DM and HLD.    Lindell Spar, MD

## 2022-09-16 NOTE — Patient Instructions (Addendum)
Please start taking Atorvastatin 80 mg once daily.  Please continue to take other medications as prescribed.  Please continue to follow DASH diet and perform moderate exercise/walking at least 150 mins/week.

## 2022-09-16 NOTE — Assessment & Plan Note (Signed)
Noted on CT chest BP WNL On Lipitor

## 2022-09-16 NOTE — Assessment & Plan Note (Signed)
Check lipid profile Continue atorvastatin 40 mg daily Needs to comply with DASH diet

## 2022-09-16 NOTE — Assessment & Plan Note (Signed)
Well controlled with as needed albuterol

## 2022-09-16 NOTE — Assessment & Plan Note (Signed)
BP Readings from Last 1 Encounters:  09/16/22 126/74   Well-controlled with Amlodipine and Lisinopril Counseled for compliance with the medications Advised DASH diet and moderate exercise/walking, at least 150 mins/week

## 2022-09-16 NOTE — Assessment & Plan Note (Signed)
Low dose CT chest (06/23) - Aneurysmal dilatation of the infrarenal abdominal aorta measuring 3.1 x 3.0 cm. Recommend follow-up ultrasound every 3 years.

## 2022-09-16 NOTE — Assessment & Plan Note (Signed)
Lab Results  Component Value Date   HGBA1C 6.1 (H) 09/07/2022    Associated with HLD and HTN Well-controlled with low-carb diet alone Advised to follow diabetic diet On statin and ACEi F/u CMP and lipid panel Diabetic eye exam: Advised to follow up with Ophthalmology for diabetic eye exam

## 2022-09-17 NOTE — Assessment & Plan Note (Addendum)
Mild nocturia PSA wnl Symptoms better recently

## 2022-09-22 ENCOUNTER — Other Ambulatory Visit: Payer: Self-pay | Admitting: Internal Medicine

## 2022-09-22 DIAGNOSIS — E782 Mixed hyperlipidemia: Secondary | ICD-10-CM

## 2023-02-28 ENCOUNTER — Ambulatory Visit (INDEPENDENT_AMBULATORY_CARE_PROVIDER_SITE_OTHER): Payer: Medicare Other

## 2023-02-28 VITALS — Ht 71.0 in | Wt 185.0 lb

## 2023-02-28 DIAGNOSIS — Z0000 Encounter for general adult medical examination without abnormal findings: Secondary | ICD-10-CM | POA: Diagnosis not present

## 2023-02-28 DIAGNOSIS — Z87891 Personal history of nicotine dependence: Secondary | ICD-10-CM

## 2023-02-28 NOTE — Patient Instructions (Signed)
[name] , Thank you for taking time to come for your Medicare Wellness Visit. I appreciate your ongoing commitment to your health goals. Please review the following plan we discussed and let me know if I can assist you in the future.   These are the goals we discussed:  Goals       Patient Stated      Get follow up low dose chest scan Sept 1 or soon after      Patient Stated (pt-stated)      To travel and see family members that live in [place] and [place].       Weight (lb) < 180 lb (81.6 kg)        This is a list of the screening recommended for you and due dates:  Health Maintenance  Topic Date Due   DTaP/Tdap/Td vaccine (1 - Tdap) Never done   Zoster (Shingles) Vaccine (1 of 2) Never done   Pneumonia Vaccine (1 of 1 - PCV) Never done   COVID-19 Vaccine (1 - 2023-24 season) Never done   Complete foot exam   09/14/2022   Yearly kidney health urinalysis for diabetes  03/16/2023   Eye exam for diabetics  03/27/2024*   Flu Shot  03/03/2023   Hemoglobin A1C  03/08/2023   Screening for Lung Cancer  04/22/2023   Yearly kidney function blood test for diabetes  09/08/2023   Medicare Annual Wellness Visit  02/28/2024   Cologuard (Stool DNA test)  09/30/2024   Hepatitis C Screening  Completed   HPV Vaccine  Aged Out   Colon Cancer Screening  Discontinued  *Topic was postponed. The date shown is not the original due date.    Advanced directives: Advance directive discussed with you today. Even though you declined this today, please call our office should you change your mind, and we can give you the proper paperwork for you to fill out. Advance care planning is a way to make decisions about medical care that fits your values in case you are ever unable to make these decisions for yourself.  Information on Advanced Care Planning can be found at [facility] Advance Health Care Directives Advance Health Care Directives ([email or web address]    Conditions/risks identified:   You have an order for:    [x]   Lung Cancer Screening  Please call for appointment:   [facility] [address] [place], [place] [address] [phone]  Make sure to wear two-piece clothing.  No lotions powders or deodorants the day of the appointment Make sure to bring picture ID and insurance card.  Bring list of medications you are currently taking including any supplements.   Schedule your [facility] screening mammogram through MyChart!   Log into your MyChart account.  Go to 'Visit' (or 'Appointments' if on mobile App) --> Schedule an Appointment  Under 'Select a Reason for Visit' choose the Mammogram Screening option.  Complete the pre-visit questions and select the time and place that best fits your schedule.     Next appointment: VIRTUAL/ TELEPHONE VISIT Follow up in one year for your annual wellness visit  April 11, 2024 at 1:30pm telephone visit.    Preventive Care 47 Years and Older, Male  Preventive care refers to lifestyle choices and visits with your health care provider that can promote health and wellness. What does preventive care include? A yearly physical exam. This is also called an annual well check. Dental exams once or  twice a year. Routine eye exams. Ask your health care provider how often you should have your eyes checked. Personal lifestyle choices, including: Daily care of your teeth and gums. Regular physical activity. Eating a healthy diet. Avoiding tobacco and drug use. Limiting alcohol use. Practicing safe sex. Taking low doses of aspirin every day. Taking vitamin and mineral supplements as recommended by your health care provider. What happens during an annual well check? The services and screenings done by your health care provider during your annual well check will depend on your age, overall health, lifestyle risk factors, and family history of disease. Counseling  Your health care  provider may ask you questions about your: Alcohol use. Tobacco use. Drug use. Emotional well-being. Home and relationship well-being. Sexual activity. Eating habits. History of falls. Memory and ability to understand (cognition). Work and work Astronomer. Screening  You may have the following tests or measurements: Height, weight, and BMI. Blood pressure. Lipid and cholesterol levels. These may be checked every 5 years, or more frequently if you are over 79 years old. Skin check. Lung cancer screening. You may have this screening every year starting at age 75 if you have a 30-pack-year history of smoking and currently smoke or have quit within the past 15 years. Fecal occult blood test (FOBT) of the stool. You may have this test every year starting at age 67. Flexible sigmoidoscopy or colonoscopy. You may have a sigmoidoscopy every 5 years or a colonoscopy every 10 years starting at age 30. Prostate cancer screening. Recommendations will vary depending on your family history and other risks. Hepatitis C blood test. Hepatitis B blood test. Sexually transmitted disease (STD) testing. Diabetes screening. This is done by checking your blood sugar (glucose) after you have not eaten for a while (fasting). You may have this done every 1-3 years. Abdominal aortic aneurysm (AAA) screening. You may need this if you are a current or former smoker. Osteoporosis. You may be screened starting at age 59 if you are at high risk. Talk with your health care provider about your test results, treatment options, and if necessary, the need for more tests. Vaccines  Your health care provider may recommend certain vaccines, such as: Influenza vaccine. This is recommended every year. Tetanus, diphtheria, and acellular pertussis (Tdap, Td) vaccine. You may need a Td booster every 10 years. Zoster vaccine. You may need this after age 14. Pneumococcal 13-valent conjugate (PCV13) vaccine. One dose is  recommended after age 38. Pneumococcal polysaccharide (PPSV23) vaccine. One dose is recommended after age 29. Talk to your health care provider about which screenings and vaccines you need and how often you need them. This information is not intended to replace advice given to you by your health care provider. Make sure you discuss any questions you have with your health care provider. Document Released: 08/15/2015 Document Revised: 04/07/2016 Document Reviewed: 05/20/2015 Elsevier Interactive Patient Education  2017 [employer].  Fall Prevention in the Home Falls can cause injuries. They can happen to people of all ages. There are many things you can do to make your home safe and to help prevent falls. What can I do on the outside of my home? Regularly fix the edges of walkways and driveways and fix any cracks. Remove anything that might make you trip as you walk through a door, such as a raised step or threshold. Trim any bushes or trees on the path to your home. Use bright outdoor lighting. Clear any walking paths of anything that might  make someone trip, such as rocks or tools. Regularly check to see if handrails are loose or broken. Make sure that both sides of any steps have handrails. Any raised decks and porches should have guardrails on the edges. Have any leaves, snow, or ice cleared regularly. Use sand or salt on walking paths during winter. Clean up any spills in your garage right away. This includes oil or grease spills. What can I do in the bathroom? Use night lights. Install grab bars by the toilet and in the tub and shower. Do not use towel bars as grab bars. Use non-skid mats or decals in the tub or shower. If you need to sit down in the shower, use a plastic, non-slip stool. Keep the floor dry. Clean up any water that spills on the floor as soon as it happens. Remove soap buildup in the tub or shower regularly. Attach bath mats securely with double-sided non-slip rug  tape. Do not have throw rugs and other things on the floor that can make you trip. What can I do in the bedroom? Use night lights. Make sure that you have a light by your bed that is easy to reach. Do not use any sheets or blankets that are too big for your bed. They should not hang down onto the floor. Have a firm chair that has side arms. You can use this for support while you get dressed. Do not have throw rugs and other things on the floor that can make you trip. What can I do in the kitchen? Clean up any spills right away. Avoid walking on wet floors. Keep items that you use a lot in easy-to-reach places. If you need to reach something above you, use a strong step stool that has a grab bar. Keep electrical cords out of the way. Do not use floor polish or wax that makes floors slippery. If you must use wax, use non-skid floor wax. Do not have throw rugs and other things on the floor that can make you trip. What can I do with my stairs? Do not leave any items on the stairs. Make sure that there are handrails on both sides of the stairs and use them. Fix handrails that are broken or loose. Make sure that handrails are as long as the stairways. Check any carpeting to make sure that it is firmly attached to the stairs. Fix any carpet that is loose or worn. Avoid having throw rugs at the top or bottom of the stairs. If you do have throw rugs, attach them to the floor with carpet tape. Make sure that you have a light switch at the top of the stairs and the bottom of the stairs. If you do not have them, ask someone to add them for you. What else can I do to help prevent falls? Wear shoes that: Do not have high heels. Have rubber bottoms. Are comfortable and fit you well. Are closed at the toe. Do not wear sandals. If you use a stepladder: Make sure that it is fully opened. Do not climb a closed stepladder. Make sure that both sides of the stepladder are locked into place. Ask someone to  hold it for you, if possible. Clearly mark and make sure that you can see: Any grab bars or handrails. First and last steps. Where the edge of each step is. Use tools that help you move around (mobility aids) if they are needed. These include: Canes. Walkers. Scooters. Crutches. Turn on the lights when  you go into a dark area. Replace any light bulbs as soon as they burn out. Set up your furniture so you have a clear path. Avoid moving your furniture around. If any of your floors are uneven, fix them. If there are any pets around you, be aware of where they are. Review your medicines with your doctor. Some medicines can make you feel dizzy. This can increase your chance of falling. Ask your doctor what other things that you can do to help prevent falls. This information is not intended to replace advice given to you by your health care provider. Make sure you discuss any questions you have with your health care provider. Document Released: 05/15/2009 Document Revised: 12/25/2015 Document Reviewed: 08/23/2014 Elsevier Interactive Patient Education  2017 [employer].

## 2023-02-28 NOTE — Progress Notes (Signed)
 Because this visit was a virtual/telehealth visit,  certain criteria was not obtained, such a blood pressure, CBG if patient is a diabetic, and timed up and go. Any medications not marked as "taking" was not mentioned during the medication reconciliation part of the visit. Any vitals not documented were not able to be obtained due to this being a telehealth visit. Vitals documented are verbally provided by the patient.  Per patient no change in vitals since last visit, unable to obtain new vitals due to telehealth visit.   Subjective:   Daniel Chung is a 67 y.o. male who presents for Medicare Annual/Subsequent preventive examination.  Visit Complete: Virtual  I connected with  [name] on 02/28/23 by a audio enabled telemedicine application and verified that I am speaking with the correct person using two identifiers.  Patient Location: Home  Provider Location: Home Office  I discussed the limitations of evaluation and management by telemedicine. The patient expressed understanding and agreed to proceed.  Patient Medicare AWV questionnaire was completed by the patient on n/a; I have confirmed that all information answered by patient is correct and no changes since this date.  Review of Systems     Cardiac Risk Factors include: advanced age (>21men, >69 women);dyslipidemia;hypertension;male gender     Objective:    Today's Vitals   02/28/23 1305  Weight: 185 lb (83.9 kg)  Height: 5\' 11"  (1.803 m)   Body mass index is 25.8 kg/m.     02/28/2023    1:08 PM 02/12/2022    1:14 PM 01/25/2022    7:27 AM 11/19/2020    3:13 PM  Advanced Directives  Does Patient Have a Medical Advance Directive? No No No No  Would patient like information on creating a medical advance directive? No - Patient declined Yes (ED - Information included in AVS)  No - Patient declined    Current Medications (verified) Outpatient Encounter Medications as of 02/28/2023  Medication Sig   amLODipine  (NORVASC) 10 MG tablet Take 1 tablet (10 mg total) by mouth daily.   aspirin 81 MG EC tablet Take 1 tablet (81 mg total) by mouth daily. Swallow whole.   atorvastatin (LIPITOR) 80 MG tablet Take 1 tablet (80 mg total) by mouth daily.   Calcium Carbonate Antacid (TUMS CHEWY BITES PO) Take 1 tablet by mouth daily as needed (heartburn or indigestion).   lisinopril (ZESTRIL) 40 MG tablet Take 1 tablet (40 mg total) by mouth daily.   PROVENTIL HFA 108 (90 Base) MCG/ACT inhaler INHALE 2 PUFFS BY MOUTH EVERY 6 HOURS AS NEEDED FOR WHEEZING OR SHORTNESS OF BREATH   No facility-administered encounter medications on file as of 02/28/2023.    Allergies (verified) Patient has no known allergies.   History: Past Medical History:  Diagnosis Date   Anginal pain (HCC)    Arthritis    Emphysema lung (HCC)    GERD (gastroesophageal reflux disease)    Hyperlipidemia    Hypertension    Peripheral neuropathy    Tinnitus    Past Surgical History:  Procedure Laterality Date   COLONOSCOPY WITH PROPOFOL N/A 01/25/2022   Procedure: COLONOSCOPY WITH PROPOFOL;  Surgeon: Lanelle Bal, DO;  Location: [facility];  Service: Endoscopy;  Laterality: N/A;  9:15 / ASA 3   ELBOW SURGERY Left 1991   for tennis elbow   FEMORAL-POPLITEAL BYPASS GRAFT Left 2011   [place], [place]   POLYPECTOMY  01/25/2022   Procedure: POLYPECTOMY;  Surgeon: Lanelle Bal, DO;  Location: [facility];  Service: Endoscopy;;   TONSILLECTOMY  age 24   VASECTOMY     Family History  Problem Relation Age of Onset   Scleroderma Mother    Diabetes Father    Hypertension Father    Cancer Sister    COPD Sister    Cancer Brother    Social History   Socioeconomic History   Marital status: Divorced    Spouse name: Not on file   Number of children: 3   Years of education: Not on file   Highest education level: Associate degree: occupational, Scientist, product/process development, or vocational program  Occupational History    Comment: Retired   Tobacco  Use   Smoking status: Former    Current packs/day: 0.00    Average packs/day: 1 pack/day for 50.0 years (50.0 ttl pk-yrs)    Types: Cigarettes    Start date: 03/07/1968    Quit date: 03/07/2018    Years since quitting: 4.9   Smokeless tobacco: Never  Vaping Use   Vaping status: Never Used  Substance and Sexual Activity   Alcohol use: Yes    Comment: rare social   Drug use: Yes    Frequency: 2.0 times per week    Types: Marijuana, PCP    Comment: per week  last smoked on 01/19/22   Sexual activity: Yes  Other Topics Concern   Not on file  Social History Narrative   Lives alone   3 children: 2 in [place], 1 in [place]      Enjoys: casino, auto racing-short tracks      Diet: lunch is biggest meal, eats all food groups   Caffeine: none really    Water: 4 16 oz bottles      Wears seat belt   Does not use phone while drinking   Psychologist, sport and exercise    Social Determinants of Health   Financial Resource Strain: Low Risk  (02/28/2023)   Overall Financial Resource Strain (CARDIA)    Difficulty of Paying Living Expenses: Not hard at all  Food Insecurity: No Food Insecurity (02/28/2023)   Hunger Vital Sign    Worried About Running Out of Food in the Last Year: Never true    Ran Out of Food in the Last Year: Never true  Transportation Needs: No Transportation Needs (02/28/2023)   PRAPARE - Administrator, Civil Service (Medical): No    Lack of Transportation (Non-Medical): No  Physical Activity: Sufficiently Active (02/28/2023)   Exercise Vital Sign    Days of Exercise per Week: 7 days    Minutes of Exercise per Session: 30 min  Stress: No Stress Concern Present (02/28/2023)   [employer] of Occupational Health - Occupational Stress Questionnaire    Feeling of Stress : Not at all  Social Connections: Socially Isolated (02/28/2023)   Social Connection and Isolation Panel [NHANES]    Frequency of Communication with Friends and Family: More than three times a week    Frequency of  Social Gatherings with Friends and Family: More than three times a week    Attends Religious Services: Never    Database administrator or Organizations: No    Attends Engineer, structural: Never    Marital Status: Divorced    Tobacco Counseling Counseling given: Yes   Clinical Intake:  Pre-visit preparation completed: Yes  Pain : No/denies pain     BMI - recorded: 25.8 Nutritional Status: BMI 25 -29 Overweight Nutritional Risks: None Diabetes: No  How often do  you need to have someone help you when you read instructions, pamphlets, or other written materials from your doctor or pharmacy?: 1 - Never  Interpreter Needed?: No  Information entered by ::  Detrick Dani, CMA   Activities of Daily Living    02/28/2023    1:07 PM  In your present state of health, do you have any difficulty performing the following activities:  Hearing? 0  Vision? 0  Difficulty concentrating or making decisions? 0  Walking or climbing stairs? 0  Dressing or bathing? 0  Doing errands, shopping? 0  Preparing Food and eating ? N  Using the Toilet? N  In the past six months, have you accidently leaked urine? N  Do you have problems with loss of bowel control? N  Managing your Medications? N  Managing your Finances? N  Housekeeping or managing your Housekeeping? N    Patient Care Team: [name], MD as PCP - General (Internal Medicine)  Indicate any recent Medical Services you may have received from other than [facility] providers in the past year (date may be approximate).     Assessment:   This is a routine wellness examination for [place].  Hearing/Vision screen Hearing Screening - Comments:: Patient denies any hearing difficulties.    Dietary issues and exercise activities discussed:     Goals Addressed               This Visit's Progress     Patient Stated (pt-stated)        To travel and see family members that live in [place] and [place].         Depression Screen    02/28/2023    1:10 PM 09/16/2022   10:39 AM 03/15/2022   10:03 AM 02/12/2022    1:17 PM 09/14/2021    9:58 AM 05/14/2021    9:57 AM 01/12/2021   11:03 AM  PHQ 2/9 Scores  PHQ - 2 Score 0 0 0 0 0 0 0    Fall Risk    02/28/2023    1:08 PM 09/16/2022   10:39 AM 03/15/2022   10:03 AM 02/12/2022    1:18 PM 09/14/2021    9:57 AM  Fall Risk   Falls in the past year? 0 0 0 0 0  Number falls in past yr: 0 0 0 0 0  Injury with Fall? 0 0 0 0 0  Risk for fall due to : No Fall Risks  No Fall Risks  No Fall Risks  Follow up Falls prevention discussed  Falls evaluation completed  Falls evaluation completed    MEDICARE RISK AT HOME:  Medicare Risk at Home - 02/28/23 1309     Any stairs in or around the home? No    If so, are there any without handrails? No    Home free of loose throw rugs in walkways, pet beds, electrical cords, etc? Yes    Adequate lighting in your home to reduce risk of falls? Yes    Life alert? No    Use of a cane, walker or w/c? No    Grab bars in the bathroom? No    Shower chair or bench in shower? No    Elevated toilet seat or a handicapped toilet? Yes             TIMED UP AND GO:  Was the test performed?  No    Cognitive Function:        02/28/2023    1:09 PM  02/12/2022    1:22 PM  6CIT Screen  What Year? 0 points 0 points  What month? 0 points 0 points  What time? 0 points 0 points  Count back from 20 0 points 0 points  Months in reverse 0 points 0 points  Repeat phrase 0 points 0 points  Total Score 0 points 0 points    Immunizations  There is no immunization history on file for this patient.  TDAP status: Due, Education has been provided regarding the importance of this vaccine. Advised may receive this vaccine at local pharmacy or Health Dept. Aware to provide a copy of the vaccination record if obtained from local pharmacy or Health Dept. Verbalized acceptance and understanding.  Flu Vaccine status: Up to  date  Pneumococcal vaccine status: Due, Education has been provided regarding the importance of this vaccine. Advised may receive this vaccine at local pharmacy or Health Dept. Aware to provide a copy of the vaccination record if obtained from local pharmacy or Health Dept. Verbalized acceptance and understanding.  Covid-19 vaccine status: Information provided on how to obtain vaccines.   Qualifies for Shingles Vaccine? Yes   Zostavax completed No   Shingrix Completed?: No.    Education has been provided regarding the importance of this vaccine. Patient has been advised to call insurance company to determine out of pocket expense if they have not yet received this vaccine. Advised may also receive vaccine at local pharmacy or Health Dept. Verbalized acceptance and understanding.  Screening Tests Health Maintenance  Topic Date Due   DTaP/Tdap/Td (1 - Tdap) Never done   Zoster Vaccines- Shingrix (1 of 2) Never done   Pneumonia Vaccine 53+ Years old (1 of 1 - PCV) Never done   COVID-19 Vaccine (1 - 2023-24 season) Never done   OPHTHALMOLOGY EXAM  05/28/2022   FOOT EXAM  09/14/2022   Medicare Annual Wellness (AWV)  02/13/2023   Diabetic kidney evaluation - Urine ACR  03/16/2023   INFLUENZA VACCINE  03/03/2023   HEMOGLOBIN A1C  03/08/2023   Lung Cancer Screening  04/22/2023   Diabetic kidney evaluation - eGFR measurement  09/08/2023   Fecal DNA (Cologuard)  09/30/2024   Hepatitis C Screening  Completed   HPV VACCINES  Aged Out   Colonoscopy  Discontinued    Health Maintenance  Health Maintenance Due  Topic Date Due   DTaP/Tdap/Td (1 - Tdap) Never done   Zoster Vaccines- Shingrix (1 of 2) Never done   Pneumonia Vaccine 24+ Years old (1 of 1 - PCV) Never done   COVID-19 Vaccine (1 - 2023-24 season) Never done   OPHTHALMOLOGY EXAM  05/28/2022   FOOT EXAM  09/14/2022   Medicare Annual Wellness (AWV)  02/13/2023   Diabetic kidney evaluation - Urine ACR  03/16/2023    Colorectal  cancer screening: Type of screening: Cologuard. Completed 09/30/2021. Repeat every 3 years  Lung Cancer Screening: (Low Dose CT Chest recommended if Age 71-80 years, 20 pack-year currently smoking OR have quit w/in 15years.) does qualify.   Lung Cancer Screening Referral: placed 02/28/2023  Additional Screening:  Hepatitis C Screening: does not qualify; Completed 05/10/2021  Vision Screening: Recommended annual ophthalmology exams for early detection of glaucoma and other disorders of the eye. Is the patient up to date with their annual eye exam?  No  Who is the provider or what is the name of the office in which the patient attends annual eye exams? N/a If pt is not established with a provider, would they  like to be referred to a provider to establish care? No .   Dental Screening: Recommended annual dental exams for proper oral hygiene  Diabetic Foot Exam: Diabetic Foot Exam: Overdue, Pt has been advised about the importance in completing this exam. Pt is scheduled for diabetic foot exam on 03/17/2023.  Community Resource Referral / Chronic Care Management: CRR required this visit?  No   CCM required this visit?  No     Plan:     I have personally reviewed and noted the following in the patient's chart:   Medical and social history Use of alcohol, tobacco or illicit drugs  Current medications and supplements including opioid prescriptions. Patient is not currently taking opioid prescriptions. Functional ability and status Nutritional status Physical activity Advanced directives List of other physicians Hospitalizations, surgeries, and ER visits in previous 12 months Vitals Screenings to include cognitive, depression, and falls Referrals and appointments  In addition, I have reviewed and discussed with patient certain preventive protocols, quality metrics, and best practice recommendations. A written personalized care plan for preventive services as well as general preventive  health recommendations were provided to patient.     Jordan Hawks Akshath Mccarey, CMA   02/28/2023   After Visit Summary: (Mail) Due to this being a telephonic visit, the after visit summary with patients personalized plan was offered to patient via mail   Nurse Notes: Patient is due for a diabetic foot exam.

## 2023-03-07 ENCOUNTER — Other Ambulatory Visit (HOSPITAL_COMMUNITY)
Admission: RE | Admit: 2023-03-07 | Discharge: 2023-03-07 | Disposition: A | Discharge status: Home / Self Care | Payer: Medicare Other | Source: Ambulatory Visit | Attending: Internal Medicine | Admitting: Internal Medicine

## 2023-03-07 DIAGNOSIS — E782 Mixed hyperlipidemia: Secondary | ICD-10-CM | POA: Diagnosis present

## 2023-03-07 DIAGNOSIS — E1169 Type 2 diabetes mellitus with other specified complication: Secondary | ICD-10-CM | POA: Insufficient documentation

## 2023-03-07 LAB — HEMOGLOBIN A1C
Hgb A1c MFr Bld: 6.4 % — ABNORMAL HIGH (ref 4.8–5.6)
Mean Plasma Glucose: 136.98 mg/dL

## 2023-03-07 LAB — LIPID PANEL
Cholesterol: 150 mg/dL (ref 0–200)
HDL: 30 mg/dL — ABNORMAL LOW (ref >40)
LDL Cholesterol: 98 mg/dL (ref 0–99)
Total CHOL/HDL Ratio: 5 RATIO
Triglycerides: 112 mg/dL (ref <150)
VLDL: 22 mg/dL (ref 0–40)

## 2023-03-14 ENCOUNTER — Ambulatory Visit (INDEPENDENT_AMBULATORY_CARE_PROVIDER_SITE_OTHER): Payer: Medicare Other

## 2023-03-14 DIAGNOSIS — E1169 Type 2 diabetes mellitus with other specified complication: Secondary | ICD-10-CM | POA: Diagnosis not present

## 2023-03-14 DIAGNOSIS — E089 Diabetes mellitus due to underlying condition without complications: Secondary | ICD-10-CM

## 2023-03-14 NOTE — Progress Notes (Signed)
Daniel Chung arrived 03/14/2023 and has given verbal consent to obtain images and complete their overdue diabetic retinal screening.  The images have been sent to an ophthalmologist or optometrist for review and interpretation.  Results will be sent back to [name], MD for review.  Patient has been informed they will be contacted when we receive the results via telephone or MyChart

## 2023-03-17 ENCOUNTER — Ambulatory Visit (INDEPENDENT_AMBULATORY_CARE_PROVIDER_SITE_OTHER): Payer: Medicare Other | Admitting: Internal Medicine

## 2023-03-17 ENCOUNTER — Encounter: Payer: Self-pay | Admitting: Internal Medicine

## 2023-03-17 VITALS — BP 132/70 | HR 84 | Ht 71.0 in | Wt 184.6 lb

## 2023-03-17 DIAGNOSIS — Z95828 Presence of other vascular implants and grafts: Secondary | ICD-10-CM

## 2023-03-17 DIAGNOSIS — N401 Enlarged prostate with lower urinary tract symptoms: Secondary | ICD-10-CM

## 2023-03-17 DIAGNOSIS — E1169 Type 2 diabetes mellitus with other specified complication: Secondary | ICD-10-CM | POA: Diagnosis not present

## 2023-03-17 DIAGNOSIS — I10 Essential (primary) hypertension: Secondary | ICD-10-CM | POA: Diagnosis not present

## 2023-03-17 DIAGNOSIS — R351 Nocturia: Secondary | ICD-10-CM

## 2023-03-17 DIAGNOSIS — M778 Other enthesopathies, not elsewhere classified: Secondary | ICD-10-CM

## 2023-03-17 DIAGNOSIS — I7143 Infrarenal abdominal aortic aneurysm, without rupture: Secondary | ICD-10-CM | POA: Diagnosis not present

## 2023-03-17 DIAGNOSIS — E782 Mixed hyperlipidemia: Secondary | ICD-10-CM

## 2023-03-17 DIAGNOSIS — J449 Chronic obstructive pulmonary disease, unspecified: Secondary | ICD-10-CM

## 2023-03-17 DIAGNOSIS — E559 Vitamin D deficiency, unspecified: Secondary | ICD-10-CM

## 2023-03-17 DIAGNOSIS — I739 Peripheral vascular disease, unspecified: Secondary | ICD-10-CM

## 2023-03-17 NOTE — Assessment & Plan Note (Signed)
Check lipid profile Continue atorvastatin 40 mg daily Needs to comply with DASH diet 

## 2023-03-17 NOTE — Patient Instructions (Addendum)
Please continue to take medications as prescribed.  Please continue to follow low carb diet and perform moderate exercise/walking at least 150 mins/week.  Please get fasting blood tests done before the next visit. 

## 2023-03-17 NOTE — Assessment & Plan Note (Signed)
Well controlled with as needed albuterol 

## 2023-03-17 NOTE — Assessment & Plan Note (Addendum)
BP Readings from Last 1 Encounters:  03/17/23 132/70   Well-controlled with Amlodipine and Lisinopril Counseled for compliance with the medications Advised DASH diet and moderate exercise/walking, at least 150 mins/week

## 2023-03-17 NOTE — Progress Notes (Signed)
Established Patient Office Visit  Subjective:  Patient ID: Daniel Chung, male    DOB: 12/05/1955  Age: 67 y.o. MRN: [id number]  CC:  Chief Complaint  Patient presents with   Diabetes    Follow up    Tendonitis    Tendonitis in right elbow     HPI Daniel Chung is a 67 y.o. male with past medical history of HTN, HLD, type 2 DM and GERD who presents for f/u of his chronic medical conditions.  HTN: BP is well-controlled. Takes medications regularly. Patient denies headache, dizziness, chest pain, dyspnea or palpitations.   Type II DM with HLD: His HbA1c is stable at 6.4 now, but had improved to 6.1 in the last visit.  He has been following strict low-carb diet.  Denies any fatigue, polyuria or polydipsia currently.  PAD: He is on aspirin and statin currently.  Denies any foot pain, numbness or pallor of the feet.  He has squeezing sensation in the left toes area, which is chronic since his left femoropopliteal bypass surgery in 2011.  He has not seen vascular surgeon since moving to [place].  He reports mild, intermittent right elbow area pain since 01/15/23.  He started having elbow pain after he overworked putting poles for the fence.  He has pain upon lifting with RUE in the extended elbow position.  He has tried ice application with some relief.  Denies any numbness or tingling of the UE.    Past Medical History:  Diagnosis Date   Anginal pain (HCC)    Arthritis    Emphysema lung (HCC)    GERD (gastroesophageal reflux disease)    Hyperlipidemia    Hypertension    Peripheral neuropathy    Tinnitus     Past Surgical History:  Procedure Laterality Date   COLONOSCOPY WITH PROPOFOL N/A 01/25/2022   Procedure: COLONOSCOPY WITH PROPOFOL;  Surgeon: Daniel Bal, DO;  [facility];  Service: Endoscopy;  Laterality: N/A;  9:15 / ASA 3   ELBOW SURGERY Left 1991   for tennis elbow   FEMORAL-POPLITEAL BYPASS GRAFT Left 2011   [place], [place]   POLYPECTOMY  01/25/2022    Procedure: POLYPECTOMY;  Surgeon: Daniel Bal, DO;  [facility]: [facility];  Service: Endoscopy;;   TONSILLECTOMY  age 55   VASECTOMY      Family History  Problem Relation Age of Onset   Scleroderma Mother    Diabetes Father    Hypertension Father    Cancer Sister    COPD Sister    Cancer Brother     Social History   Socioeconomic History   Marital status: Divorced    Spouse name: Not on file   Number of children: 3   Years of education: Not on file   Highest education level: Associate degree: occupational, Scientist, product/process development, or vocational program  Occupational History    Comment: Retired   Tobacco Use   Smoking status: Former    Current packs/day: 0.00    Average packs/day: 1 pack/day for 50.0 years (50.0 ttl pk-yrs)    Types: Cigarettes    Start date: 03/07/1968    Quit date: 03/07/2018    Years since quitting: 5.0   Smokeless tobacco: Never  Vaping Use   Vaping status: Never Used  Substance and Sexual Activity   Alcohol use: Yes    Comment: rare social   Drug use: Yes    Frequency: 2.0 times per week    Types: Marijuana, PCP  Comment: per week  last smoked on 01/19/22   Sexual activity: Yes  Other Topics Concern   Not on file  Social History Narrative   Lives alone   3 children: 2 in [place], 1 in [place]      Enjoys: casino, auto racing-short tracks      Diet: lunch is biggest meal, eats all food groups   Caffeine: none really    Water: 4 16 oz bottles      Wears seat belt   Does not use phone while drinking   Psychologist, sport and exercise    Social Determinants of Health   Financial Resource Strain: Low Risk  (02/28/2023)   Overall Financial Resource Strain (CARDIA)    Difficulty of Paying Living Expenses: Not hard at all  Food Insecurity: No Food Insecurity (02/28/2023)   Hunger Vital Sign    Worried About Running Out of Food in the Last Year: Never true    Ran Out of Food in the Last Year: Never true  Transportation Needs: No Transportation Needs (02/28/2023)   PRAPARE  - Administrator, Civil Service (Medical): No    Lack of Transportation (Non-Medical): No  Physical Activity: Sufficiently Active (02/28/2023)   Exercise Vital Sign    Days of Exercise per Week: 7 days    Minutes of Exercise per Session: 30 min  Stress: No Stress Concern Present (02/28/2023)   [employer] of Occupational Health - Occupational Stress Questionnaire    Feeling of Stress : Not at all  Social Connections: Socially Isolated (02/28/2023)   Social Connection and Isolation Panel [NHANES]    Frequency of Communication with Friends and Family: More than three times a week    Frequency of Social Gatherings with Friends and Family: More than three times a week    Attends Religious Services: Never    Database administrator or Organizations: No    Attends Banker Meetings: Never    Marital Status: Divorced  Catering manager Violence: Not At Risk (02/28/2023)   Humiliation, Afraid, Rape, and Kick questionnaire    Fear of Current or Ex-Partner: No    Emotionally Abused: No    Physically Abused: No    Sexually Abused: No    Outpatient Medications Prior to Visit  Medication Sig Dispense Refill   amLODipine (NORVASC) 10 MG tablet Take 1 tablet (10 mg total) by mouth daily. 90 tablet 3   aspirin 81 MG EC tablet Take 1 tablet (81 mg total) by mouth daily. Swallow whole. 30 tablet 12   atorvastatin (LIPITOR) 80 MG tablet Take 1 tablet (80 mg total) by mouth daily. 90 tablet 3   Calcium Carbonate Antacid (TUMS CHEWY BITES PO) Take 1 tablet by mouth daily as needed (heartburn or indigestion).     lisinopril (ZESTRIL) 40 MG tablet Take 1 tablet (40 mg total) by mouth daily. 90 tablet 3   PROVENTIL HFA 108 (90 Base) MCG/ACT inhaler INHALE 2 PUFFS BY MOUTH EVERY 6 HOURS AS NEEDED FOR WHEEZING OR SHORTNESS OF BREATH 20.1 g 2   No facility-administered medications prior to visit.    No Known Allergies  ROS Review of Systems  Constitutional:  Negative for chills  and fever.  HENT:  Negative for congestion and sore throat.   Eyes:  Negative for pain and discharge.  Respiratory:  Negative for cough and shortness of breath.   Cardiovascular:  Negative for chest pain and palpitations.  Gastrointestinal:  Negative for constipation, diarrhea, nausea and vomiting.  Endocrine: Negative for polydipsia and polyuria.  Genitourinary:  Negative for dysuria and hematuria.  Musculoskeletal:  Negative for neck pain and neck stiffness.  Skin:  Negative for rash.  Neurological:  Negative for dizziness, weakness, numbness and headaches.  Psychiatric/Behavioral:  Negative for agitation and behavioral problems.       Objective:    Physical Exam Vitals reviewed.  Constitutional:      General: He is not in acute distress.    Appearance: He is not diaphoretic.  HENT:     Head: Normocephalic and atraumatic.     Nose: Nose normal.     Mouth/Throat:     Mouth: Mucous membranes are moist.  Eyes:     General: No scleral icterus.    Extraocular Movements: Extraocular movements intact.  Cardiovascular:     Rate and Rhythm: Normal rate and regular rhythm.     Pulses: Normal pulses.     Heart sounds: Normal heart sounds. No murmur heard. Pulmonary:     Breath sounds: Normal breath sounds. No wheezing or rales.  Musculoskeletal:     Right elbow: Normal range of motion. Tenderness present in lateral epicondyle.     Cervical back: Neck supple. No tenderness.     Right lower leg: No edema.     Left lower leg: No edema.  Skin:    General: Skin is warm.     Findings: No rash.  Neurological:     General: No focal deficit present.     Mental Status: He is alert and oriented to person, place, and time.     Sensory: No sensory deficit.     Motor: No weakness.  Psychiatric:        Mood and Affect: Mood normal.        Behavior: Behavior normal.     BP 132/70 (BP Location: Left Arm)   Pulse 84   Ht 5\' 11"  (1.803 m)   Wt 184 lb 9.6 oz (83.7 kg)   SpO2 96%   BMI  25.75 kg/m  Wt Readings from Last 3 Encounters:  03/17/23 184 lb 9.6 oz (83.7 kg)  02/28/23 185 lb (83.9 kg)  09/16/22 191 lb 6.4 oz (86.8 kg)    Lab Results  Component Value Date   TSH 1.260 09/07/2022   Lab Results  Component Value Date   WBC 9.4 09/07/2022   HGB 15.7 09/07/2022   HCT 45.7 09/07/2022   MCV 92.5 09/07/2022   PLT 215 09/07/2022   Lab Results  Component Value Date   NA 134 (L) 09/07/2022   K 4.3 09/07/2022   CO2 23 09/07/2022   GLUCOSE 111 (H) 09/07/2022   BUN 22 09/07/2022   CREATININE 1.00 09/07/2022   BILITOT 0.8 09/07/2022   ALKPHOS 90 09/07/2022   AST 21 09/07/2022   ALT 23 09/07/2022   PROT 7.5 09/07/2022   ALBUMIN 4.4 09/07/2022   CALCIUM 9.7 09/07/2022   ANIONGAP 10 09/07/2022   EGFR 77 03/15/2022   Lab Results  Component Value Date   CHOL 150 03/07/2023   Lab Results  Component Value Date   HDL 30 (L) 03/07/2023   Lab Results  Component Value Date   LDLCALC 98 03/07/2023   Lab Results  Component Value Date   TRIG 112 03/07/2023   Lab Results  Component Value Date   CHOLHDL 5.0 03/07/2023   Lab Results  Component Value Date   HGBA1C 6.4 (H) 03/07/2023      Assessment & Plan:   Problem  List Items Addressed This Visit       Cardiovascular and Mediastinum   Essential hypertension    BP Readings from Last 1 Encounters:  03/17/23 132/70   Well-controlled with Amlodipine and Lisinopril Counseled for compliance with the medications Advised DASH diet and moderate exercise/walking, at least 150 mins/week      Relevant Orders   TSH   CMP14+EGFR   CBC with Differential/Platelet   PAD (peripheral artery disease) (HCC)    Status post femoropopliteal bypass surgery in 2011 On aspirin and statin currently Denies classic claudication symptoms currently, but has chronic squeezing sensation in the foot area, unclear if related to neuropathy May need vascular studies, refer to vascular surgery      AAA (abdominal aortic  aneurysm) (HCC)    Low dose CT chest (06/23) - Aneurysmal dilatation of the infrarenal abdominal aorta measuring 3.1 x 3.0 cm. Recommend follow-up ultrasound every 3 years.        Respiratory   Chronic obstructive pulmonary disease (HCC)    Well controlled with as needed albuterol        Endocrine   Type 2 diabetes mellitus with other specified complication (HCC) - Primary    Lab Results  Component Value Date   HGBA1C 6.4 (H) 03/07/2023    Associated with HLD and HTN Well-controlled with low-carb diet alone Advised to follow diabetic diet On statin and ACEi F/u CMP and lipid panel Diabetic eye exam: Advised to follow up with Ophthalmology for diabetic eye exam      Relevant Orders   Urine Microalbumin w/creat. ratio   Hemoglobin A1c   CMP14+EGFR     Musculoskeletal and Integument   Right elbow tendinitis    Mild pain over lateral epicondyles area of left elbow, especially upon extension Since pain is intermittent and manageable for now, advised to take Tylenol as needed Advised to apply ice        Other   Hyperlipidemia    Check lipid profile Continue atorvastatin 40 mg daily Needs to comply with DASH diet      Relevant Orders   Lipid panel   Benign prostatic hyperplasia with nocturia    Mild nocturia PSA wnl Symptoms better recently      Relevant Orders   PSA   S/P femoral-popliteal bypass surgery    In 2011 On aspirin and statin Referred to vascular surgery      Relevant Orders   Ambulatory referral to Vascular Surgery   Other Visit Diagnoses     Vitamin D deficiency       Relevant Orders   VITAMIN D 25 Hydroxy (Vit-D Deficiency, Fractures)        No orders of the defined types were placed in this encounter.   Follow-up: Return in about 6 months (around 09/17/2023) for Annual physical.    [name], MD

## 2023-03-17 NOTE — Assessment & Plan Note (Signed)
Mild nocturia PSA wnl Symptoms better recently

## 2023-03-17 NOTE — Assessment & Plan Note (Signed)
In 2011 On aspirin and statin Referred to vascular surgery

## 2023-03-17 NOTE — Assessment & Plan Note (Signed)
Mild pain over lateral epicondyles area of left elbow, especially upon extension Since pain is intermittent and manageable for now, advised to take Tylenol as needed Advised to apply ice

## 2023-03-17 NOTE — Assessment & Plan Note (Signed)
Status post femoropopliteal bypass surgery in 2011 On aspirin and statin currently Denies classic claudication symptoms currently, but has chronic squeezing sensation in the foot area, unclear if related to neuropathy May need vascular studies, refer to vascular surgery

## 2023-03-17 NOTE — Assessment & Plan Note (Signed)
Low dose CT chest (06/23) - Aneurysmal dilatation of the infrarenal abdominal aorta measuring 3.1 x 3.0 cm. Recommend follow-up ultrasound every 3 years.

## 2023-03-17 NOTE — Assessment & Plan Note (Signed)
Lab Results  Component Value Date   HGBA1C 6.4 (H) 03/07/2023    Associated with HLD and HTN Well-controlled with low-carb diet alone Advised to follow diabetic diet On statin and ACEi F/u CMP and lipid panel Diabetic eye exam: Advised to follow up with Ophthalmology for diabetic eye exam

## 2023-03-19 LAB — MICROALBUMIN / CREATININE URINE RATIO
Creatinine, Urine: 95.8 mg/dL
Microalb/Creat Ratio: 8 mg/g{creat} (ref 0–29)
Microalbumin, Urine: 7.7 ug/mL

## 2023-03-22 ENCOUNTER — Other Ambulatory Visit: Payer: Self-pay | Admitting: *Deleted

## 2023-03-22 DIAGNOSIS — I739 Peripheral vascular disease, unspecified: Secondary | ICD-10-CM

## 2023-03-25 ENCOUNTER — Encounter: Payer: Self-pay | Admitting: Vascular Surgery

## 2023-03-25 ENCOUNTER — Ambulatory Visit (INDEPENDENT_AMBULATORY_CARE_PROVIDER_SITE_OTHER): Payer: Medicare Other | Admitting: Vascular Surgery

## 2023-03-25 ENCOUNTER — Ambulatory Visit (HOSPITAL_COMMUNITY)
Admission: RE | Admit: 2023-03-25 | Discharge: 2023-03-25 | Disposition: A | Discharge status: Home / Self Care | Payer: Medicare Other | Source: Ambulatory Visit | Attending: Vascular Surgery | Admitting: Vascular Surgery

## 2023-03-25 VITALS — BP 132/78 | HR 71 | Temp 98.2°F | Resp 20 | Ht 71.0 in | Wt 185.0 lb

## 2023-03-25 DIAGNOSIS — I70219 Atherosclerosis of native arteries of extremities with intermittent claudication, unspecified extremity: Secondary | ICD-10-CM

## 2023-03-25 DIAGNOSIS — I739 Peripheral vascular disease, unspecified: Secondary | ICD-10-CM | POA: Insufficient documentation

## 2023-03-25 LAB — VAS US ABI WITH/WO TBI
Left ABI: 0.88
Right ABI: 0.88

## 2023-03-25 NOTE — Progress Notes (Signed)
ASSESSMENT & PLAN   PERIPHERAL ARTERIAL DISEASE: The patient has evidence of mild infrainguinal arterial occlusive disease bilaterally.  He is not a smoker.  He is on aspirin and is on a statin.  I have encouraged him to stay as active as possible.  I do not have the operative report from [place] on not sure exactly what was done of the left groin.  He does have a small hematoma on the left.  I looked with the ultrasound and I did not see evidence of a pseudoaneurysm.  It appeared to be a hematoma on top of the artery.  ABDOMINAL AORTIC ANEURYSM: This patient was told in [place] that he had a small 2.9 cm infrarenal abdominal aortic aneurysm.  He has now recently moved to [facility] and comes to establish vascular care.  When he comes back in 1 year for follow-up arterial studies we will get a duplex of his aorta to have as a baseline.  Based on the size at that time we can establish the interval for follow-up.  Fortunately he is not a smoker.   REASON FOR CONSULT:    To establish vascular care.  The consult is requested by Dr. Allena Katz.  HPI:   Daniel Chung is a 67 y.o. male who underwent a procedure in his left groin around 2011 in [place].  He is not sure exactly what was done.  He has now moved to this area and comes to establish vascular care.  He was also told that he had a small 2.9 cm infrarenal abdominal aortic aneurysm.  He does not think he had any follow-up since he has been here.  I do not get any history of claudication, rest pain, or nonhealing ulcers.  He quit smoking in 2019.  He is on aspirin and is on a statin.  Past Medical History:  Diagnosis Date   Anginal pain (HCC)    Arthritis    Emphysema lung (HCC)    GERD (gastroesophageal reflux disease)    Hyperlipidemia    Hypertension    Peripheral neuropathy    Tinnitus     Family History  Problem Relation Age of Onset   Scleroderma Mother    Diabetes Father    Hypertension Father    Cancer Sister    COPD Sister     Cancer Brother     SOCIAL HISTORY: Social History   Tobacco Use   Smoking status: Former    Current packs/day: 0.00    Average packs/day: 1 pack/day for 50.0 years (50.0 ttl pk-yrs)    Types: Cigarettes    Start date: 03/07/1968    Quit date: 03/07/2018    Years since quitting: 5.0   Smokeless tobacco: Never  Substance Use Topics   Alcohol use: Yes    Comment: rare social    No Known Allergies  Current Outpatient Medications  Medication Sig Dispense Refill   amLODipine (NORVASC) 10 MG tablet Take 1 tablet (10 mg total) by mouth daily. 90 tablet 3   aspirin 81 MG EC tablet Take 1 tablet (81 mg total) by mouth daily. Swallow whole. 30 tablet 12   atorvastatin (LIPITOR) 80 MG tablet Take 1 tablet (80 mg total) by mouth daily. 90 tablet 3   Calcium Carbonate Antacid (TUMS CHEWY BITES PO) Take 1 tablet by mouth daily as needed (heartburn or indigestion).     lisinopril (ZESTRIL) 40 MG tablet Take 1 tablet (40 mg total) by mouth daily. 90 tablet 3   PROVENTIL HFA  108 (90 Base) MCG/ACT inhaler INHALE 2 PUFFS BY MOUTH EVERY 6 HOURS AS NEEDED FOR WHEEZING OR SHORTNESS OF BREATH 20.1 g 2   No current facility-administered medications for this visit.    REVIEW OF SYSTEMS:  [X]  denotes positive finding, [ ]  denotes negative finding Cardiac  Comments:  Chest pain or chest pressure:    Shortness of breath upon exertion: x   Short of breath when lying flat:    Irregular heart rhythm:        Vascular    Pain in calf, thigh, or hip brought on by ambulation: x   Pain in feet at night that wakes you up from your sleep:     Blood clot in your veins:    Leg swelling:         Pulmonary    Oxygen at home:    Productive cough:     Wheezing:         Neurologic    Sudden weakness in arms or legs:     Sudden numbness in arms or legs:     Sudden onset of difficulty speaking or slurred speech:    Temporary loss of vision in one eye:     Problems with dizziness:         Gastrointestinal     Blood in stool:     Vomited blood:         Genitourinary    Burning when urinating:     Blood in urine:        Psychiatric    Major depression:         Hematologic    Bleeding problems:    Problems with blood clotting too easily:        Skin    Rashes or ulcers:        Constitutional    Fever or chills:    -  PHYSICAL EXAM:   Vitals:   03/25/23 1249  BP: 132/78  Pulse: 71  Resp: 20  Temp: 98.2 F (36.8 C)  SpO2: 93%  Weight: 185 lb (83.9 kg)  Height: 5\' 11"  (1.803 m)   Body mass index is 25.8 kg/m. GENERAL: The patient is a well-nourished male, in no acute distress. The vital signs are documented above. CARDIAC: There is a regular rate and rhythm.  VASCULAR: I do not detect carotid bruits. He has palpable femoral pulses. He had a small mass over the left femoral artery.  I looked with the ultrasound and this appears to simply be a hematoma.  I did not see any flow into this area by duplex. On the right side he has a palpable dorsalis pedis pulse. I cannot palpate pulses on the left side. PULMONARY: There is good air exchange bilaterally without wheezing or rales. ABDOMEN: Soft and non-tender with normal pitched bowel sounds.  His aorta is palpable and nontender. MUSCULOSKELETAL: There are no major deformities. NEUROLOGIC: No focal weakness or paresthesias are detected. SKIN: There are no ulcers or rashes noted. PSYCHIATRIC: The patient has a normal affect.  DATA:    ARTERIAL DOPPLER STUDY: I have independently interpreted his arterial Doppler study today.  On the right side he has a triphasic dorsalis pedis and posterior tibial signal.  ABI is 88%.  Toe pressures 105 mmHg.  On the left side he has a biphasic posterior tibial and dorsalis pedis signal.  ABI is 88%.  Toe pressure is 112 mmHg.   Waverly [facility]

## 2023-04-12 ENCOUNTER — Other Ambulatory Visit: Payer: Self-pay

## 2023-04-12 DIAGNOSIS — I7143 Infrarenal abdominal aortic aneurysm, without rupture: Secondary | ICD-10-CM

## 2023-04-12 DIAGNOSIS — I739 Peripheral vascular disease, unspecified: Secondary | ICD-10-CM

## 2023-04-12 NOTE — Addendum Note (Signed)
Addended by: Leilani Able, Judieth Mckown A on: 04/12/2023 05:12 PM   Modules accepted: Orders

## 2023-04-25 ENCOUNTER — Encounter: Payer: Self-pay | Admitting: Emergency Medicine

## 2023-06-07 ENCOUNTER — Telehealth: Payer: Self-pay | Admitting: Internal Medicine

## 2023-06-07 NOTE — Telephone Encounter (Signed)
Patient called.  Effective 01.01.2025 his pharmacy will need to [facility].  Instead of [facility].

## 2023-08-31 ENCOUNTER — Other Ambulatory Visit: Payer: Self-pay | Admitting: Internal Medicine

## 2023-08-31 DIAGNOSIS — E782 Mixed hyperlipidemia: Secondary | ICD-10-CM

## 2023-09-02 ENCOUNTER — Other Ambulatory Visit: Payer: Self-pay | Admitting: Internal Medicine

## 2023-09-02 DIAGNOSIS — E782 Mixed hyperlipidemia: Secondary | ICD-10-CM

## 2023-09-02 NOTE — Telephone Encounter (Signed)
Copied from CRM [id number]. Topic: Clinical - Medication Refill >> Sep 02, 2023  2:24 PM Jorje Guild R wrote: Most Recent Primary Care Visit:  Provider: [name]  Department: [facility]  Visit Type: OFFICE VISIT  Date: 03/17/2023  Medication: atorvastatin (LIPITOR) 80 MG tablet  Has the patient contacted their pharmacy? Yes (Agent: If no, request that the patient contact the pharmacy for the refill. If patient does not wish to contact the pharmacy document the reason why and proceed with request.) (Agent: If yes, when and what did the pharmacy advise?)  Is this the correct pharmacy for this prescription? No If no, delete pharmacy and type the correct one.  This is the patient's preferred pharmacy: No Pharmacies Listed [facility]  [address] [place], [place] [address] [phone]   Has the prescription been filled recently? Yes  Is the patient out of the medication? Yes  Has the patient been seen for an appointment in the last year OR does the patient have an upcoming appointment? Yes  Can we respond through MyChart? Yes  Agent: Please be advised that Rx refills may take up to 3 business days. We ask that you follow-up with your pharmacy.

## 2023-09-19 ENCOUNTER — Ambulatory Visit (INDEPENDENT_AMBULATORY_CARE_PROVIDER_SITE_OTHER): Payer: Medicare Other | Admitting: Internal Medicine

## 2023-09-19 ENCOUNTER — Encounter: Payer: Self-pay | Admitting: Internal Medicine

## 2023-09-19 VITALS — BP 119/71 | HR 103 | Ht 71.0 in | Wt 188.8 lb

## 2023-09-19 DIAGNOSIS — R351 Nocturia: Secondary | ICD-10-CM

## 2023-09-19 DIAGNOSIS — E782 Mixed hyperlipidemia: Secondary | ICD-10-CM | POA: Diagnosis not present

## 2023-09-19 DIAGNOSIS — J449 Chronic obstructive pulmonary disease, unspecified: Secondary | ICD-10-CM

## 2023-09-19 DIAGNOSIS — Z0001 Encounter for general adult medical examination with abnormal findings: Secondary | ICD-10-CM

## 2023-09-19 DIAGNOSIS — I10 Essential (primary) hypertension: Secondary | ICD-10-CM

## 2023-09-19 DIAGNOSIS — E1169 Type 2 diabetes mellitus with other specified complication: Secondary | ICD-10-CM | POA: Diagnosis not present

## 2023-09-19 DIAGNOSIS — I739 Peripheral vascular disease, unspecified: Secondary | ICD-10-CM

## 2023-09-19 DIAGNOSIS — R918 Other nonspecific abnormal finding of lung field: Secondary | ICD-10-CM

## 2023-09-19 DIAGNOSIS — N401 Enlarged prostate with lower urinary tract symptoms: Secondary | ICD-10-CM

## 2023-09-19 DIAGNOSIS — F17211 Nicotine dependence, cigarettes, in remission: Secondary | ICD-10-CM

## 2023-09-19 DIAGNOSIS — E559 Vitamin D deficiency, unspecified: Secondary | ICD-10-CM

## 2023-09-19 MED ORDER — AMLODIPINE BESYLATE 10 MG PO TABS: 10.0000 mg | ORAL_TABLET | Freq: Every day | ORAL | 3 refills | Status: DC

## 2023-09-19 MED ORDER — LISINOPRIL 40 MG PO TABS: 40.0000 mg | ORAL_TABLET | Freq: Every day | ORAL | 3 refills | Status: DC

## 2023-09-19 MED ORDER — ATORVASTATIN CALCIUM 80 MG PO TABS: 80.0000 mg | ORAL_TABLET | Freq: Every day | ORAL | 3 refills | Status: DC

## 2023-09-19 NOTE — Progress Notes (Addendum)
 Established Patient Office Visit  Subjective:  Patient ID: Daniel Chung, male    DOB: 12-02-1955  Age: 68 y.o. MRN: [id number]  CC:  Chief Complaint  Patient presents with   Annual Exam    Cpe and 6 month f/u    HPI Daniel Chung is a 68 y.o. male with past medical history of HTN, HLD, type 2 DM and GERD who presents for f/u of his chronic medical conditions.  HTN: BP is well-controlled. Takes medications regularly. Patient denies headache, dizziness, chest pain, dyspnea or palpitations.   Type II DM with HLD: His HbA1c was stable at 6.4 in 08/24, but had improved to 6.1 previously.  He has been following strict low-carb diet.  Denies any fatigue, polyuria or polydipsia currently.  PAD: He is on aspirin and statin currently.  Denies any foot pain, numbness or pallor of the feet.  He has squeezing sensation in the left toes area, which is chronic since his left femoropopliteal bypass surgery in 2011.  He has seen vascular surgeon in 08/24.    Past Medical History:  Diagnosis Date   Anginal pain (HCC)    Arthritis    Emphysema lung (HCC)    GERD (gastroesophageal reflux disease)    Hyperlipidemia    Hypertension    Peripheral neuropathy    Tinnitus     Past Surgical History:  Procedure Laterality Date   COLONOSCOPY WITH PROPOFOL N/A 01/25/2022   Procedure: COLONOSCOPY WITH PROPOFOL;  Surgeon: Lanelle Bal, DO;  [facility];  Service: Endoscopy;  Laterality: N/A;  9:15 / ASA 3   ELBOW SURGERY Left 1991   for tennis elbow   FEMORAL-POPLITEAL BYPASS GRAFT Left 2011   [place], [place]   POLYPECTOMY  01/25/2022   Procedure: POLYPECTOMY;  Surgeon: Lanelle Bal, DO;  Location: [facility];  Service: Endoscopy;;   TONSILLECTOMY  age 22   VASECTOMY      Family History  Problem Relation Age of Onset   Scleroderma Mother    Diabetes Father    Hypertension Father    Cancer Sister    COPD Sister    Cancer Brother     Social History   Socioeconomic  History   Marital status: Divorced    Spouse name: Not on file   Number of children: 3   Years of education: Not on file   Highest education level: Associate degree: occupational, Scientist, product/process development, or vocational program  Occupational History    Comment: Retired   Tobacco Use   Smoking status: Former    Current packs/day: 0.00    Average packs/day: 1.5 packs/day for 50.0 years (75.0 ttl pk-yrs)    Types: Cigarettes    Start date: 03/07/1968    Quit date: 03/07/2018    Years since quitting: 5.6   Smokeless tobacco: Never  Vaping Use   Vaping status: Never Used  Substance and Sexual Activity   Alcohol use: Yes    Comment: rare social   Drug use: Yes    Frequency: 2.0 times per week    Types: Marijuana, PCP    Comment: per week  last smoked on 01/19/22   Sexual activity: Yes  Other Topics Concern   Not on file  Social History Narrative   Lives alone   3 children: 2 in [place], 1 in [place]      Enjoys: casino, auto racing-short tracks      Diet: lunch is biggest meal, eats all food groups   Caffeine: none  really    Water: 4 16 oz bottles      Wears seat belt   Does not use phone while drinking   Smoke detectors    Social Drivers of Health   Financial Resource Strain: Low Risk  (02/28/2023)   Overall Financial Resource Strain (CARDIA)    Difficulty of Paying Living Expenses: Not hard at all  Food Insecurity: No Food Insecurity (02/28/2023)   Hunger Vital Sign    Worried About Running Out of Food in the Last Year: Never true    Ran Out of Food in the Last Year: Never true  Transportation Needs: No Transportation Needs (02/28/2023)   PRAPARE - Administrator, Civil Service (Medical): No    Lack of Transportation (Non-Medical): No  Physical Activity: Sufficiently Active (02/28/2023)   Exercise Vital Sign    Days of Exercise per Week: 7 days    Minutes of Exercise per Session: 30 min  Stress: No Stress Concern Present (02/28/2023)   [employer] of Occupational Health -  Occupational Stress Questionnaire    Feeling of Stress : Not at all  Social Connections: Socially Isolated (02/28/2023)   Social Connection and Isolation Panel [NHANES]    Frequency of Communication with Friends and Family: More than three times a week    Frequency of Social Gatherings with Friends and Family: More than three times a week    Attends Religious Services: Never    Database administrator or Organizations: No    Attends Banker Meetings: Never    Marital Status: Divorced  Catering manager Violence: Not At Risk (02/28/2023)   Humiliation, Afraid, Rape, and Kick questionnaire    Fear of Current or Ex-Partner: No    Emotionally Abused: No    Physically Abused: No    Sexually Abused: No    Outpatient Medications Prior to Visit  Medication Sig Dispense Refill   aspirin 81 MG EC tablet Take 1 tablet (81 mg total) by mouth daily. Swallow whole. 30 tablet 12   Calcium Carbonate Antacid (TUMS CHEWY BITES PO) Take 1 tablet by mouth daily as needed (heartburn or indigestion).     PROVENTIL HFA 108 (90 Base) MCG/ACT inhaler INHALE 2 PUFFS BY MOUTH EVERY 6 HOURS AS NEEDED FOR WHEEZING OR SHORTNESS OF BREATH 20.1 g 2   amLODipine (NORVASC) 10 MG tablet Take 1 tablet (10 mg total) by mouth daily. 90 tablet 3   atorvastatin (LIPITOR) 80 MG tablet Take 1 tablet by mouth once daily 90 tablet 0   lisinopril (ZESTRIL) 40 MG tablet Take 1 tablet (40 mg total) by mouth daily. 90 tablet 3   No facility-administered medications prior to visit.    No Known Allergies  ROS Review of Systems  Constitutional:  Negative for chills and fever.  HENT:  Negative for congestion and sore throat.   Eyes:  Negative for pain and discharge.  Respiratory:  Negative for cough and shortness of breath.   Cardiovascular:  Negative for chest pain and palpitations.  Gastrointestinal:  Negative for constipation, diarrhea, nausea and vomiting.  Endocrine: Negative for polydipsia and polyuria.   Genitourinary:  Negative for dysuria and hematuria.  Musculoskeletal:  Negative for neck pain and neck stiffness.  Skin:  Negative for rash.  Neurological:  Negative for dizziness, weakness, numbness and headaches.  Psychiatric/Behavioral:  Negative for agitation and behavioral problems.       Objective:    Physical Exam Vitals reviewed.  Constitutional:  General: He is not in acute distress.    Appearance: He is not diaphoretic.  HENT:     Head: Normocephalic and atraumatic.     Nose: Nose normal.     Mouth/Throat:     Mouth: Mucous membranes are moist.  Eyes:     General: No scleral icterus.    Extraocular Movements: Extraocular movements intact.  Cardiovascular:     Rate and Rhythm: Normal rate and regular rhythm.     Pulses: Normal pulses.     Heart sounds: Normal heart sounds. No murmur heard. Pulmonary:     Breath sounds: Normal breath sounds. No wheezing or rales.  Musculoskeletal:     Right elbow: Normal range of motion. Tenderness present in lateral epicondyle.     Cervical back: Neck supple. No tenderness.     Right lower leg: No edema.     Left lower leg: No edema.  Skin:    General: Skin is warm.     Findings: No rash.  Neurological:     General: No focal deficit present.     Mental Status: He is alert and oriented to person, place, and time.     Sensory: No sensory deficit.     Motor: No weakness.  Psychiatric:        Mood and Affect: Mood normal.        Behavior: Behavior normal.     BP 119/71   Pulse (!) 103   Ht 5\' 11"  (1.803 m)   Wt 188 lb 12.8 oz (85.6 kg)   SpO2 94%   BMI 26.33 kg/m  Wt Readings from Last 3 Encounters:  09/19/23 188 lb 12.8 oz (85.6 kg)  03/25/23 185 lb (83.9 kg)  03/17/23 184 lb 9.6 oz (83.7 kg)    Lab Results  Component Value Date   TSH 1.340 10/19/2023   Lab Results  Component Value Date   WBC 9.9 10/19/2023   HGB 15.7 10/19/2023   HCT 45.4 10/19/2023   MCV 92.7 10/19/2023   PLT 224 10/19/2023   Lab  Results  Component Value Date   NA 133 (L) 10/19/2023   K 4.1 10/19/2023   CO2 22 10/19/2023   GLUCOSE 121 (H) 10/19/2023   BUN 17 10/19/2023   CREATININE 0.95 10/19/2023   BILITOT 0.8 10/19/2023   ALKPHOS 78 10/19/2023   AST 19 10/19/2023   ALT 19 10/19/2023   PROT 7.4 10/19/2023   ALBUMIN 4.1 10/19/2023   CALCIUM 9.4 10/19/2023   ANIONGAP 11 10/19/2023   EGFR 77 03/15/2022   Lab Results  Component Value Date   CHOL 117 10/19/2023   Lab Results  Component Value Date   HDL 29 (L) 10/19/2023   Lab Results  Component Value Date   LDLCALC 69 10/19/2023   Lab Results  Component Value Date   TRIG 97 10/19/2023   Lab Results  Component Value Date   CHOLHDL 4.0 10/19/2023   Lab Results  Component Value Date   HGBA1C 6.4 (H) 03/07/2023      Assessment & Plan:   Problem List Items Addressed This Visit       Cardiovascular and Mediastinum   Essential hypertension - Primary   BP Readings from Last 1 Encounters:  09/19/23 119/71   Well-controlled with Amlodipine and Lisinopril Counseled for compliance with the medications Advised DASH diet and moderate exercise/walking, at least 150 mins/week      Relevant Medications   atorvastatin (LIPITOR) 80 MG tablet   amLODipine (NORVASC) 10  MG tablet   lisinopril (ZESTRIL) 40 MG tablet   Other Relevant Orders   TSH   CMP14+EGFR   CBC with Differential/Platelet   PAD (peripheral artery disease) (HCC)   Status post femoropopliteal bypass surgery in 2011 On aspirin and statin currently Denies classic claudication symptoms currently, but has chronic squeezing sensation in the foot area, unclear if related to neuropathy Had vascular studies - overall unremarkable, followed by vascular surgery      Relevant Medications   atorvastatin (LIPITOR) 80 MG tablet   amLODipine (NORVASC) 10 MG tablet   lisinopril (ZESTRIL) 40 MG tablet   Other Relevant Orders   Lipid panel     Respiratory   Chronic obstructive pulmonary  disease (HCC)   Well controlled with as needed albuterol      Pulmonary nodules/lesions, multiple   Noted on CT chest Quit smoking in 2019 Check low-dose CT chest for stability - referred to lung cancer screening program        Endocrine   Type 2 diabetes mellitus with other specified complication (HCC)   Lab Results  Component Value Date   HGBA1C 6.4 (H) 03/07/2023    Associated with HLD and HTN Well-controlled with low-carb diet alone Advised to follow diabetic diet On statin and ACEi F/u CMP and lipid panel Diabetic eye exam: Advised to follow up with Ophthalmology for diabetic eye exam      Relevant Medications   atorvastatin (LIPITOR) 80 MG tablet   lisinopril (ZESTRIL) 40 MG tablet   Other Relevant Orders   Hemoglobin A1c   CMP14+EGFR   Urine Microalbumin w/creat. ratio     Other   Hyperlipidemia   Check lipid profile Continue atorvastatin 40 mg daily Needs to comply with DASH diet      Relevant Medications   atorvastatin (LIPITOR) 80 MG tablet   amLODipine (NORVASC) 10 MG tablet   lisinopril (ZESTRIL) 40 MG tablet   Other Relevant Orders   Lipid panel   Benign prostatic hyperplasia with nocturia   Mild nocturia PSA wnl Symptoms better recently      Relevant Orders   PSA   Cigarette nicotine dependence in remission   Quit smoking in 2019, has >20 pack-year smoking history Last CT chest reviewed      Relevant Orders   Ambulatory Referral Lung Cancer Screening [facility]   Other Visit Diagnoses       Vitamin D deficiency       Relevant Orders   VITAMIN D 25 Hydroxy (Vit-D Deficiency, Fractures)        Meds ordered this encounter  Medications   atorvastatin (LIPITOR) 80 MG tablet    Sig: Take 1 tablet (80 mg total) by mouth daily.    Dispense:  90 tablet    Refill:  3   amLODipine (NORVASC) 10 MG tablet    Sig: Take 1 tablet (10 mg total) by mouth daily.    Dispense:  90 tablet    Refill:  3   lisinopril (ZESTRIL) 40 MG  tablet    Sig: Take 1 tablet (40 mg total) by mouth daily.    Dispense:  90 tablet    Refill:  3    Follow-up: Return in about 6 months (around 03/18/2024).    [name], MD

## 2023-09-19 NOTE — Assessment & Plan Note (Signed)
Status post femoropopliteal bypass surgery in 2011 On aspirin and statin currently Denies classic claudication symptoms currently, but has chronic squeezing sensation in the foot area, unclear if related to neuropathy May need vascular studies, refer to vascular surgery

## 2023-09-19 NOTE — Patient Instructions (Signed)
Please continue to take medications as prescribed.  Please continue to follow low carb diet and perform moderate exercise/walking at least 150 mins/week.  Please get fasting blood tests done within a week.  

## 2023-09-19 NOTE — Assessment & Plan Note (Signed)
Mild nocturia PSA wnl Symptoms better recently

## 2023-09-19 NOTE — Assessment & Plan Note (Signed)
BP Readings from Last 1 Encounters:  09/19/23 119/71   Well-controlled with Amlodipine and Lisinopril Counseled for compliance with the medications Advised DASH diet and moderate exercise/walking, at least 150 mins/week

## 2023-09-23 NOTE — Assessment & Plan Note (Signed)
Noted on CT chest Quit smoking in 2019 Check low-dose CT chest for stability - referred to lung cancer screening program

## 2023-09-23 NOTE — Assessment & Plan Note (Signed)
Quit smoking in 2019, has >20 pack-year smoking history Last CT chest reviewed

## 2023-09-23 NOTE — Assessment & Plan Note (Signed)
Lab Results  Component Value Date   HGBA1C 6.4 (H) 03/07/2023    Associated with HLD and HTN Well-controlled with low-carb diet alone Advised to follow diabetic diet On statin and ACEi F/u CMP and lipid panel Diabetic eye exam: Advised to follow up with Ophthalmology for diabetic eye exam

## 2023-09-23 NOTE — Assessment & Plan Note (Signed)
Well controlled with as needed albuterol 

## 2023-09-23 NOTE — Assessment & Plan Note (Signed)
Check lipid profile Continue atorvastatin 40 mg daily Needs to comply with DASH diet 

## 2023-09-30 ENCOUNTER — Telehealth: Payer: Self-pay

## 2023-09-30 ENCOUNTER — Telehealth: Payer: Self-pay | Admitting: Acute Care

## 2023-09-30 ENCOUNTER — Other Ambulatory Visit: Payer: Self-pay | Admitting: Emergency Medicine

## 2023-09-30 DIAGNOSIS — Z87891 Personal history of nicotine dependence: Secondary | ICD-10-CM

## 2023-09-30 DIAGNOSIS — Z122 Encounter for screening for malignant neoplasm of respiratory organs: Secondary | ICD-10-CM

## 2023-09-30 NOTE — Telephone Encounter (Signed)
 Lung Cancer Screening Narrative/Criteria Questionnaire (Cigarette Smokers Only- No Cigars/Pipes/vapes)   Daniel Chung   SDMV:10/14/2023 at 9:30 with Dorene Grebe        06/25/1956   LDCT: 10/19/2023 at 9:30 at [facility]    68 y.o.   Phone: [phone]  Lung Screening Narrative (confirm age 62-77 yrs Medicare / 50-80 yrs Private pay insurance)   Insurance information:medicare   Referring Provider:Dr. Allena Katz   This screening involves an initial phone call with a team member from our program. It is called a shared decision making visit. The initial meeting is required by  insurance and Medicare to make sure you understand the program. This appointment takes about 15-20 minutes to complete. You will complete the screening scan at your scheduled date/time.  This scan takes about 5-10 minutes to complete. You can eat and drink normally before and after the scan.  Criteria questions for Lung Cancer Screening:   Are you a current or former smoker? Former Age began smoking: 68yo   If you are a former smoker, what year did you quit smoking? 2019(within 15 yrs)   To calculate your smoking history, I need an accurate estimate of how many packs of cigarettes you smoked per day and for how many years. (Not just the number of PPD you are now smoking)   Years smoking 54.5 x Packs per day 1.5 = Pack years 81.75   (at least 20 pack yrs)   (Make sure they understand that we need to know how much they have smoked in the past, not just the number of PPD they are smoking now)  Do you have a personal history of cancer?  No    Do you have a family history of cancer? Yes  (cancer type and and relative) Sister - lung?, Brother - colon  Are you coughing up blood?  No  Have you had unexplained weight loss of 15 lbs or more in the last 6 months? No  It looks like you meet all criteria.  When would be a good time for [place] to schedule you for this screening?   Additional information: N/A

## 2023-09-30 NOTE — Telephone Encounter (Signed)
 Returned call. LVM to discuss LCS program.

## 2023-10-14 ENCOUNTER — Ambulatory Visit (INDEPENDENT_AMBULATORY_CARE_PROVIDER_SITE_OTHER): Payer: Medicare Other | Admitting: Acute Care

## 2023-10-14 DIAGNOSIS — Z87891 Personal history of nicotine dependence: Secondary | ICD-10-CM

## 2023-10-14 NOTE — Progress Notes (Addendum)
  Provider Attestation I agree with the documentation of the Shared Decision Making visit,  smoking cessation counseling if appropriate, and verification or eligibility for lung cancer screening as documented by the RN Nurse Navigator.   Raejean Bullock, MSN, AGACNP-BC  Pulmonary/Critical Care Medicine See Amion for personal pager PCCM on call pager [phone]    Virtual Visit via Telephone Note  I connected with Daniel Chung on 10/14/23 at  9:30 AM EDT by telephone and verified that I am speaking with the correct person using two identifiers.  Location: Patient: Daniel Chung Provider: Alyse Bach, RN   I discussed the limitations, risks, security and privacy concerns of performing an evaluation and management service by telephone and the availability of in person appointments. I also discussed with the patient that there may be a patient responsible charge related to this service. The patient expressed understanding and agreed to proceed.    Shared Decision Making Visit Lung Cancer Screening Program [phone])   Eligibility: Age 68 y.o. Pack Years Smoking History Calculation 75 (# packs/per year x # years smoked) Recent History of coughing up blood  no Unexplained weight loss? no ( >Than 15 pounds within the last 6 months ) Prior History Lung / other cancer no (Diagnosis within the last 5 years already requiring surveillance chest CT Scans). Smoking Status Former Smoker Former Smokers: Years since quit: 6 years  Quit Date: 2019  Visit Components: Discussion included one or more decision making aids. yes Discussion included risk/benefits of screening. yes Discussion included potential follow up diagnostic testing for abnormal scans. yes Discussion included meaning and risk of over diagnosis. yes Discussion included meaning and risk of False Positives. yes Discussion included meaning of total radiation exposure. yes  Counseling Included: Importance of  adherence to annual lung cancer LDCT screening. yes Impact of comorbidities on ability to participate in the program. yes Ability and willingness to under diagnostic treatment. yes  Smoking Cessation Counseling: Current Smokers:  Discussed importance of smoking cessation. yes Information about tobacco cessation classes and interventions provided to patient. yes Patient provided with "ticket" for LDCT Scan. no Symptomatic Patient. no  Counseling(Intermediate counseling: > three minutes) 99406 Diagnosis Code: Tobacco Use Z72.0 Asymptomatic Patient yes  Counseling (Intermediate counseling: > three minutes counseling) M8413 Former Smokers:  Discussed the importance of maintaining cigarette abstinence. yes Diagnosis Code: Personal History of Nicotine Dependence. K44.010 Information about tobacco cessation classes and interventions provided to patient. Yes Patient provided with "ticket" for LDCT Scan. no Written Order for Lung Cancer Screening with LDCT placed in Epic. Yes (CT Chest Lung Cancer Screening Low Dose W/O CM) [id number] Z12.2-Screening of respiratory organs Z87.891-Personal history of nicotine dependence   Alyse Bach, RN

## 2023-10-14 NOTE — Patient Instructions (Signed)

## 2023-10-17 IMAGING — CT CT CHEST LUNG CANCER SCREENING LOW DOSE W/O CM
1 series · 10 of 10 positions shown, 13 images · non-contrast
Comparison: No priors.

CLINICAL DATA: 66-year-old male former smoker (quit 4 years ago)
with 96 pack-year history of smoking. Lung cancer screening
examination.



[ct lung segmentation data · axial · 0.87mm/px · z∈[+1160,+1160]mm · 10 of 389 frames shown]
[frame 1/389  mediastinal]
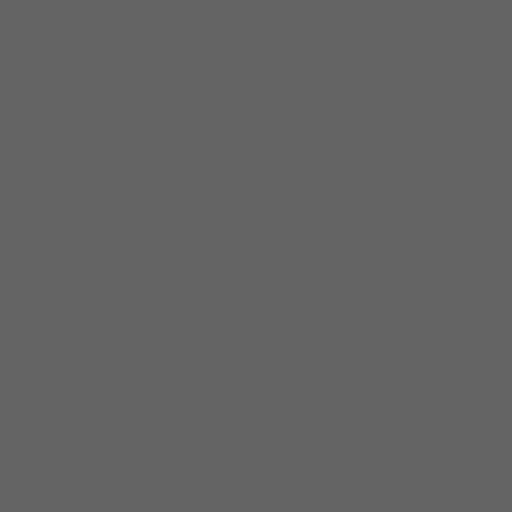
[frame 1/389  lung]
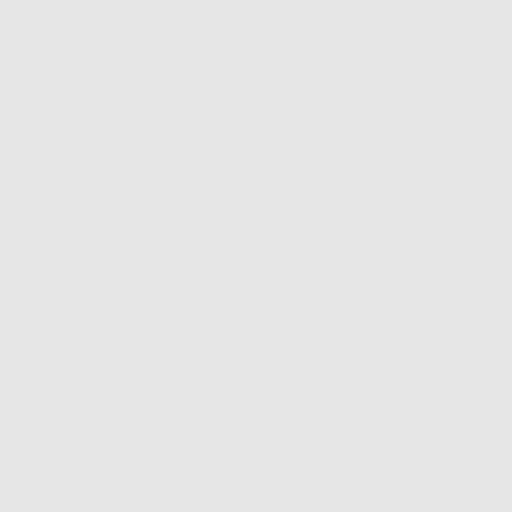
[frame 44/389  lung]
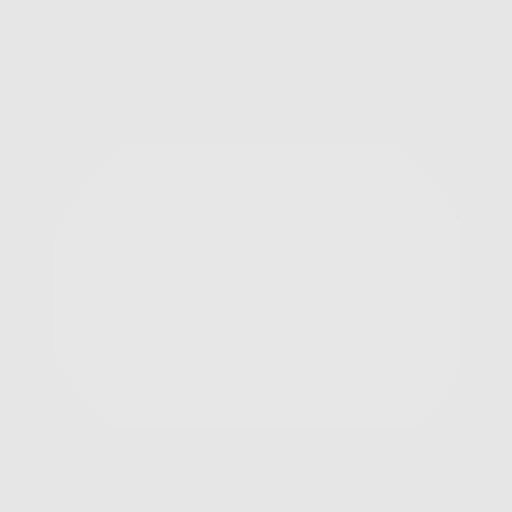
[frame 87/389  lung]
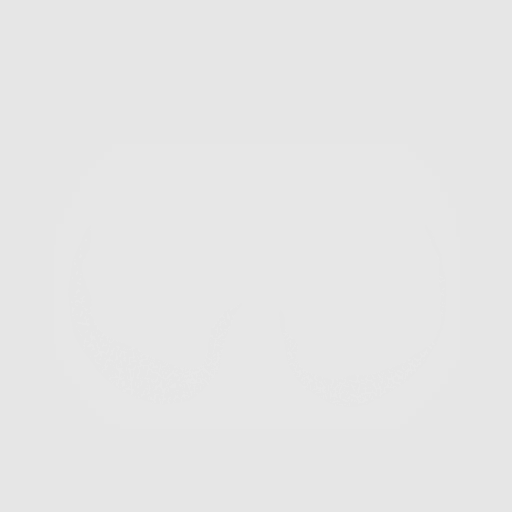
[frame 130/389  lung]
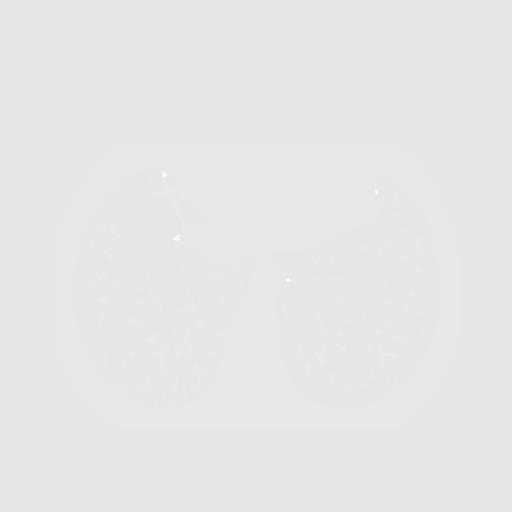
[frame 173/389  mediastinal]
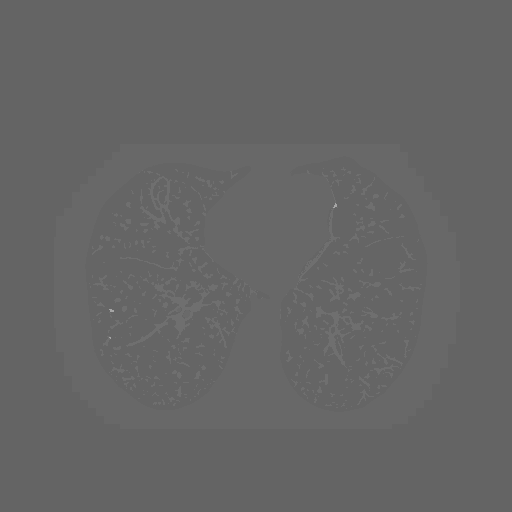
[frame 173/389  lung]
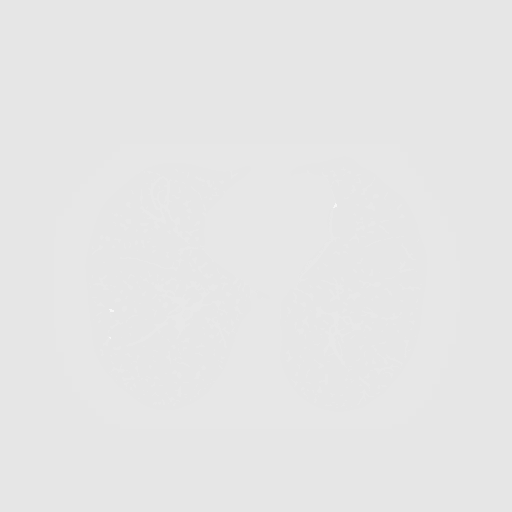
[frame 216/389  lung]
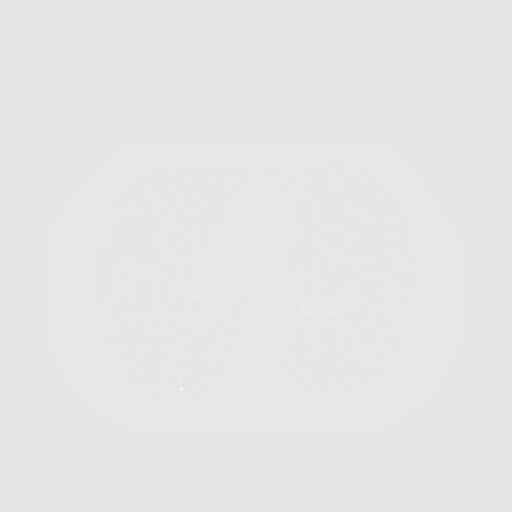
[frame 259/389  lung]
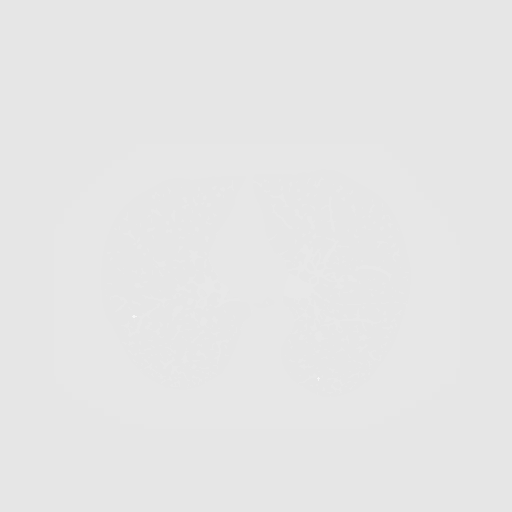
[frame 302/389  lung]
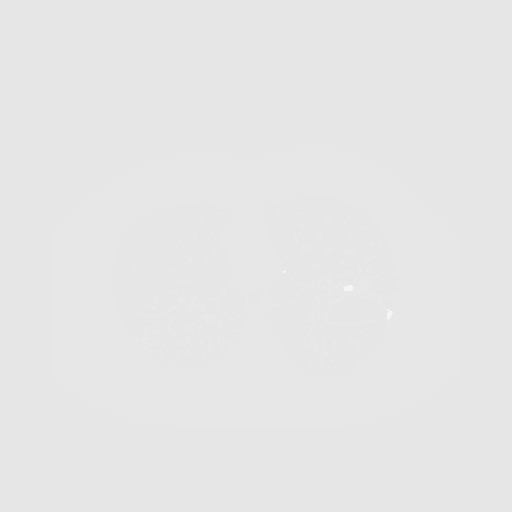
[frame 345/389  mediastinal]
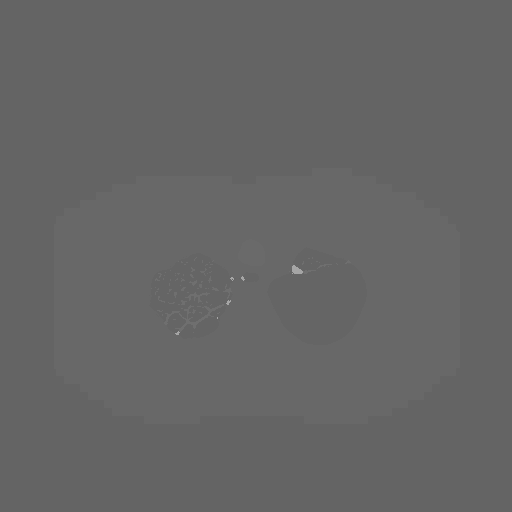
[frame 345/389  lung]
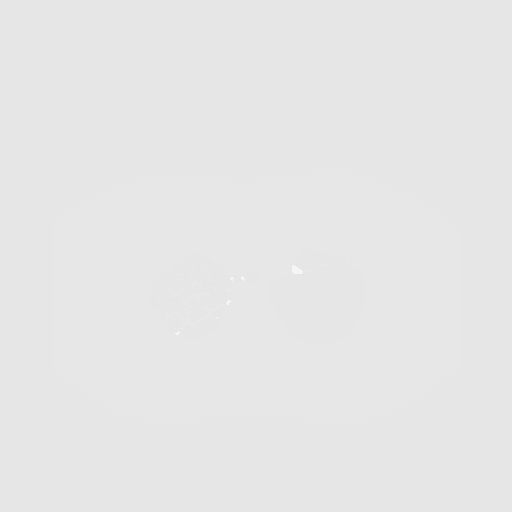
[frame 389/389  lung]
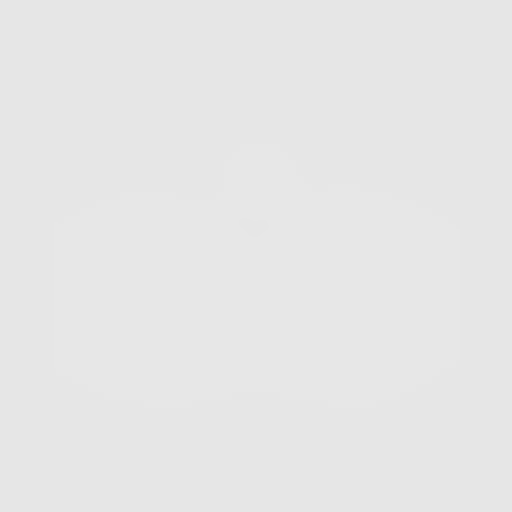

[10 of 10 positions shown; findings below may reference images not displayed]

FINDINGS: Cardiovascular: Heart size is normal. There is no significant
pericardial fluid, thickening or pericardial calcification. There is
aortic atherosclerosis, as well as atherosclerosis of the great
vessels of the mediastinum and the coronary arteries, including
calcified atherosclerotic plaque in the left main, left anterior
descending, left circumflex and right coronary arteries.
Calcifications of the aortic valve.

Mediastinum/Nodes: No pathologically enlarged mediastinal or hilar
lymph nodes. Please note that accurate exclusion of hilar adenopathy
is limited on noncontrast CT scans. Small hiatal hernia. No axillary
lymphadenopathy.

Lungs/Pleura: Multiple small calcified granulomas are noted in the
lungs bilaterally. Diffuse bronchial wall thickening with moderate
to severe centrilobular and paraseptal emphysema, including
extensive bullous changes in the apices of both lungs. On the
margins of these areas of bullous disease there are additional
nodular and mass-like areas of architectural distortion which are
only partially calcified, strongly favored to reflect areas of
chronic pleuroparenchymal scarring, largest of which is in the left
upper lobe near the apex (axial image 70) with a volume derived mean
diameter of 21.3 mm. No acute consolidative airspace disease. No
pleural effusions.

Upper Abdomen: Aortic atherosclerosis with fusiform aneurysmal
dilatation of the infrarenal abdominal aorta which measures at least
3.1 x 3.0 cm (axial image 77 of series 2).

Musculoskeletal: There are no aggressive appearing lytic or blastic
lesions noted in the visualized portions of the skeleton.
IMPRESSION: 1. Multiple nodular and mass-like partially calcified areas of
architectural distortion in the upper lungs bilaterally, technically
categorized as Lung-RADS 4AS, suspicious, but favored to be benign.
Follow up low-dose chest CT without contrast in 3 months (please use
the following order, "CT CHEST LCS NODULE FOLLOW-UP W/O CM") is
recommended. Given the high likelihood of benignity, PET-CT is not
recommended at this time.
2. The "S" modifier above refers to potentially clinically
significant non lung cancer related findings. Specifically, there is
aortic atherosclerosis, in addition to left main and three-vessel
coronary artery disease. Please note that although the presence of
coronary artery calcium documents the presence of coronary artery
disease, the severity of this disease and any potential stenosis
cannot be assessed on this non-gated CT examination. Assessment for
potential risk factor modification, dietary therapy or pharmacologic
therapy may be warranted, if clinically indicated.
3. Mild diffuse bronchial wall thickening with moderate to severe
centrilobular and paraseptal emphysema; imaging findings suggestive
of underlying COPD. There are calcifications of the aortic valve.
Echocardiographic correlation for evaluation of potential valvular
dysfunction may be warranted if clinically indicated.
4. Aneurysmal dilatation of the infrarenal abdominal aorta measuring
3.1 x 3.0 cm. Recommend follow-up ultrasound every 3 years. This
recommendation follows ACR consensus guidelines: White Paper of the
ACR Incidental Findings Committee II on Vascular Findings. [HOSPITAL] 1198; [DATE].

These results will be called to the ordering clinician or
representative by the Radiologist Assistant, and communication
documented in the PACS or [REDACTED].

Aortic Atherosclerosis (SVDJR-3I5.5) and Emphysema (SVDJR-YQP.E).

## 2023-10-19 ENCOUNTER — Ambulatory Visit (HOSPITAL_COMMUNITY)
Admission: RE | Admit: 2023-10-19 | Discharge: 2023-10-19 | Disposition: A | Discharge status: Home / Self Care | Payer: Medicare Other | Source: Ambulatory Visit | Attending: Acute Care | Admitting: Acute Care

## 2023-10-19 ENCOUNTER — Other Ambulatory Visit (HOSPITAL_COMMUNITY)
Admission: RE | Admit: 2023-10-19 | Discharge: 2023-10-19 | Disposition: A | Discharge status: Home / Self Care | Source: Ambulatory Visit | Attending: Internal Medicine | Admitting: Internal Medicine

## 2023-10-19 DIAGNOSIS — K449 Diaphragmatic hernia without obstruction or gangrene: Secondary | ICD-10-CM | POA: Diagnosis not present

## 2023-10-19 DIAGNOSIS — I739 Peripheral vascular disease, unspecified: Secondary | ICD-10-CM | POA: Diagnosis not present

## 2023-10-19 DIAGNOSIS — E559 Vitamin D deficiency, unspecified: Secondary | ICD-10-CM | POA: Insufficient documentation

## 2023-10-19 DIAGNOSIS — N401 Enlarged prostate with lower urinary tract symptoms: Secondary | ICD-10-CM | POA: Diagnosis not present

## 2023-10-19 DIAGNOSIS — Z87891 Personal history of nicotine dependence: Secondary | ICD-10-CM | POA: Insufficient documentation

## 2023-10-19 DIAGNOSIS — I10 Essential (primary) hypertension: Secondary | ICD-10-CM | POA: Insufficient documentation

## 2023-10-19 DIAGNOSIS — E1169 Type 2 diabetes mellitus with other specified complication: Secondary | ICD-10-CM | POA: Diagnosis not present

## 2023-10-19 DIAGNOSIS — E782 Mixed hyperlipidemia: Secondary | ICD-10-CM | POA: Diagnosis not present

## 2023-10-19 DIAGNOSIS — R351 Nocturia: Secondary | ICD-10-CM | POA: Insufficient documentation

## 2023-10-19 DIAGNOSIS — J439 Emphysema, unspecified: Secondary | ICD-10-CM | POA: Diagnosis not present

## 2023-10-19 DIAGNOSIS — I700 Atherosclerosis of aorta: Secondary | ICD-10-CM | POA: Insufficient documentation

## 2023-10-19 DIAGNOSIS — Z122 Encounter for screening for malignant neoplasm of respiratory organs: Secondary | ICD-10-CM | POA: Insufficient documentation

## 2023-10-19 LAB — COMPREHENSIVE METABOLIC PANEL
ALT: 19 U/L (ref 0–44)
AST: 19 U/L (ref 15–41)
Albumin: 4.1 g/dL (ref 3.5–5.0)
Alkaline Phosphatase: 78 U/L (ref 38–126)
Anion gap: 11 (ref 5–15)
BUN: 17 mg/dL (ref 8–23)
CO2: 22 mmol/L (ref 22–32)
Calcium: 9.4 mg/dL (ref 8.9–10.3)
Chloride: 100 mmol/L (ref 98–111)
Creatinine, Ser: 0.95 mg/dL (ref 0.61–1.24)
GFR, Estimated: >60 mL/min (ref >60)
Glucose, Bld: 121 mg/dL — ABNORMAL HIGH (ref 70–99)
Potassium: 4.1 mmol/L (ref 3.5–5.1)
Sodium: 133 mmol/L — ABNORMAL LOW (ref 135–145)
Total Bilirubin: 0.8 mg/dL (ref 0.0–1.2)
Total Protein: 7.4 g/dL (ref 6.5–8.1)

## 2023-10-19 LAB — CBC WITH DIFFERENTIAL/PLATELET
Abs Immature Granulocytes: 0.04 10*3/uL (ref 0.00–0.07)
Basophils Absolute: 0.1 10*3/uL (ref 0.0–0.1)
Basophils Relative: 1 %
Eosinophils Absolute: 0.3 10*3/uL (ref 0.0–0.5)
Eosinophils Relative: 3 %
HCT: 45.4 % (ref 39.0–52.0)
Hemoglobin: 15.7 g/dL (ref 13.0–17.0)
Immature Granulocytes: 0 %
Lymphocytes Relative: 31 %
Lymphs Abs: 3.1 10*3/uL (ref 0.7–4.0)
MCH: 32 pg (ref 26.0–34.0)
MCHC: 34.6 g/dL (ref 30.0–36.0)
MCV: 92.7 fL (ref 80.0–100.0)
Monocytes Absolute: 0.8 10*3/uL (ref 0.1–1.0)
Monocytes Relative: 8 %
Neutro Abs: 5.6 10*3/uL (ref 1.7–7.7)
Neutrophils Relative %: 57 %
Platelets: 224 10*3/uL (ref 150–400)
RBC: 4.9 MIL/uL (ref 4.22–5.81)
RDW: 12.8 % (ref 11.5–15.5)
WBC: 9.9 10*3/uL (ref 4.0–10.5)
nRBC: 0 % (ref 0.0–0.2)

## 2023-10-19 LAB — VITAMIN D 25 HYDROXY (VIT D DEFICIENCY, FRACTURES): Vit D, 25-Hydroxy: 19.36 ng/mL — ABNORMAL LOW (ref 30–100)

## 2023-10-19 LAB — TSH: TSH: 1.34 u[IU]/mL (ref 0.350–4.500)

## 2023-10-19 LAB — LIPID PANEL
Cholesterol: 117 mg/dL (ref 0–200)
HDL: 29 mg/dL — ABNORMAL LOW (ref >40)
LDL Cholesterol: 69 mg/dL (ref 0–99)
Total CHOL/HDL Ratio: 4 ratio
Triglycerides: 97 mg/dL (ref <150)
VLDL: 19 mg/dL (ref 0–40)

## 2023-10-19 LAB — HEMOGLOBIN A1C
Hgb A1c MFr Bld: 6.3 % — ABNORMAL HIGH (ref 4.8–5.6)
Mean Plasma Glucose: 134.11 mg/dL

## 2023-10-19 LAB — PSA: Prostatic Specific Antigen: 2.47 ng/mL (ref 0.00–4.00)

## 2023-10-20 ENCOUNTER — Telehealth: Payer: Self-pay | Admitting: Internal Medicine

## 2023-10-20 LAB — MICROALBUMIN / CREATININE URINE RATIO
Creatinine, Urine: 115.4 mg/dL
Microalb Creat Ratio: 7 mg/g{creat} (ref 0–29)
Microalb, Ur: 7.8 ug/mL — ABNORMAL HIGH

## 2023-10-20 NOTE — Telephone Encounter (Signed)
 Copied from CRM [id number]. Topic: General - Call Back - No Documentation >> Oct 20, 2023  2:40 PM Ja-Kwan M wrote: Reason for CRM: Patient stated that he was returning a missed call that he had from the office. Call back# [phone] >> Oct 20, 2023  2:45 PM Emylou G wrote: Patient called back.. checking status of missed call.Trey Paula msg has been sent

## 2023-10-20 NOTE — Telephone Encounter (Signed)
 Spoke to pt went over lab results

## 2023-11-16 ENCOUNTER — Other Ambulatory Visit: Payer: Self-pay

## 2023-11-16 DIAGNOSIS — Z122 Encounter for screening for malignant neoplasm of respiratory organs: Secondary | ICD-10-CM

## 2023-11-16 DIAGNOSIS — Z87891 Personal history of nicotine dependence: Secondary | ICD-10-CM

## 2024-01-02 ENCOUNTER — Encounter: Payer: Self-pay | Admitting: Acute Care

## 2024-03-05 ENCOUNTER — Ambulatory Visit: Payer: Medicare Other

## 2024-03-05 VITALS — Ht 71.0 in | Wt 185.0 lb

## 2024-03-05 DIAGNOSIS — Z0000 Encounter for general adult medical examination without abnormal findings: Secondary | ICD-10-CM

## 2024-03-05 NOTE — Progress Notes (Signed)
 Subjective:   Daniel Chung is a 68 y.o. who presents for a Medicare Wellness preventive visit.  As a reminder, Annual Wellness Visits don't include a physical exam, and some assessments may be limited, especially if this visit is performed virtually. We may recommend an in-person follow-up visit with your provider if needed.  Visit Complete: Virtual I connected with  Daniel Chung on 03/05/24 by a audio enabled telemedicine application and verified that I am speaking with the correct person using two identifiers.  Patient Location: Home  Provider Location: Home Office  I discussed the limitations of evaluation and management by telemedicine. The patient expressed understanding and agreed to proceed.  Vital Signs: Because this visit was a virtual/telehealth visit, some criteria may be missing or patient reported. Any vitals not documented were not able to be obtained and vitals that have been documented are patient reported.  VideoDeclined- This patient declined Librarian, academic. Therefore the visit was completed with audio only.  Persons Participating in Visit: Patient.  AWV Questionnaire: No: Patient Medicare AWV questionnaire was not completed prior to this visit.  Cardiac Risk Factors include: advanced age (>97men, >1 women);diabetes mellitus;dyslipidemia;hypertension;male gender     Objective:    Today's Vitals   03/05/24 1235  Weight: 185 lb (83.9 kg)  Height: 5' 11 (1.803 m)   Body mass index is 25.8 kg/m.     03/05/2024   12:38 PM 02/28/2023    1:08 PM 02/12/2022    1:14 PM 01/25/2022    7:27 AM 11/19/2020    3:13 PM  Advanced Directives  Does Patient Have a Medical Advance Directive? No No No No No  Would patient like information on creating a medical advance directive? Yes (MAU/Ambulatory/Procedural Areas - Information given) No - Patient declined Yes (ED - Information included in AVS)  No - Patient declined    Current Medications  (verified) Outpatient Encounter Medications as of 03/05/2024  Medication Sig   amLODipine  (NORVASC ) 10 MG tablet Take 1 tablet (10 mg total) by mouth daily.   aspirin  81 MG EC tablet Take 1 tablet (81 mg total) by mouth daily. Swallow whole.   atorvastatin  (LIPITOR) 80 MG tablet Take 1 tablet (80 mg total) by mouth daily.   Calcium  Carbonate Antacid (TUMS CHEWY BITES PO) Take 1 tablet by mouth daily as needed (heartburn or indigestion).   lisinopril  (ZESTRIL ) 40 MG tablet Take 1 tablet (40 mg total) by mouth daily.   PROVENTIL  HFA 108 (90 Base) MCG/ACT inhaler INHALE 2 PUFFS BY MOUTH EVERY 6 HOURS AS NEEDED FOR WHEEZING OR SHORTNESS OF BREATH   No facility-administered encounter medications on file as of 03/05/2024.    Allergies (verified) Patient has no known allergies.   History: Past Medical History:  Diagnosis Date   Anginal pain (HCC)    Arthritis    Emphysema lung (HCC)    GERD (gastroesophageal reflux disease)    Hyperlipidemia    Hypertension    Peripheral neuropathy    Tinnitus    Past Surgical History:  Procedure Laterality Date   COLONOSCOPY WITH PROPOFOL  N/A 01/25/2022   Procedure: COLONOSCOPY WITH PROPOFOL ;  Surgeon: Cindie Carlin POUR, DO;  [facility];  Service: Endoscopy;  Laterality: N/A;  9:15 / ASA 3   ELBOW SURGERY Left 1991   for tennis elbow   FEMORAL-POPLITEAL BYPASS GRAFT Left 2011   [place], [place]    POLYPECTOMY  01/25/2022   Procedure: POLYPECTOMY;  Surgeon: Cindie Carlin POUR, DO;  Location: [facility];  Service: Endoscopy;;   TONSILLECTOMY  age 64   VASECTOMY     Family History  Problem Relation Age of Onset   Scleroderma Mother    Diabetes Father    Hypertension Father    Cancer Sister    COPD Sister    Cancer Brother    Social History   Socioeconomic History   Marital status: Divorced    Spouse name: Not on file   Number of children: 3   Years of education: Not on file   Highest education level: Associate degree:  occupational, Scientist, product/process development, or vocational program  Occupational History    Comment: Retired   Tobacco Use   Smoking status: Former    Current packs/day: 0.00    Average packs/day: 1.5 packs/day for 50.0 years (75.0 ttl pk-yrs)    Types: Cigarettes    Start date: 03/07/1968    Quit date: 03/07/2018    Years since quitting: 6.0   Smokeless tobacco: Never  Vaping Use   Vaping status: Never Used  Substance and Sexual Activity   Alcohol use: Yes    Comment: rare social   Drug use: Yes    Frequency: 2.0 times per week    Types: Marijuana, PCP    Comment: per week  last smoked on 01/19/22   Sexual activity: Yes  Other Topics Concern   Not on file  Social History Narrative   Lives alone   3 children: 2 in [place], 1 in [place]      Enjoys: casino, auto racing-short tracks      Diet: lunch is biggest meal, eats all food groups   Caffeine: none really    Water: 4 16 oz bottles      Wears seat belt   Does not use phone while drinking   Psychologist, sport and exercise    Social Drivers of Health   Financial Resource Strain: Low Risk  (03/05/2024)   Overall Financial Resource Strain (CARDIA)    Difficulty of Paying Living Expenses: Not hard at all  Food Insecurity: No Food Insecurity (03/05/2024)   Hunger Vital Sign    Worried About Running Out of Food in the Last Year: Never true    Ran Out of Food in the Last Year: Never true  Transportation Needs: No Transportation Needs (03/05/2024)   PRAPARE - Administrator, Civil Service (Medical): No    Lack of Transportation (Non-Medical): No  Physical Activity: Sufficiently Active (03/05/2024)   Exercise Vital Sign    Days of Exercise per Week: 7 days    Minutes of Exercise per Session: 30 min  Stress: No Stress Concern Present (03/05/2024)   [employer] of Occupational Health - Occupational Stress Questionnaire    Feeling of Stress: Not at all  Social Connections: Socially Isolated (03/05/2024)   Social Connection and Isolation Panel    Frequency of  Communication with Friends and Family: More than three times a week    Frequency of Social Gatherings with Friends and Family: More than three times a week    Attends Religious Services: Never    Database administrator or Organizations: No    Attends Engineer, structural: Never    Marital Status: Divorced    Tobacco Counseling Counseling given: Yes    Clinical Intake:  Pre-visit preparation completed: Yes  Pain : No/denies pain     BMI - recorded: 25.8 Nutritional Status: BMI 25 -29 Overweight Nutritional Risks: None Diabetes: No  Lab Results  Component Value  Date   HGBA1C 6.3 (H) 10/19/2023   HGBA1C 6.4 (H) 03/07/2023   HGBA1C 6.1 (H) 09/07/2022     How often do you need to have someone help you when you read instructions, pamphlets, or other written materials from your doctor or pharmacy?: 1 - Never  Interpreter Needed?: No  Information entered by :: [facility]   Activities of Daily Living     03/05/2024   12:39 PM  In your present state of health, do you have any difficulty performing the following activities:  Hearing? 1  Comment a little difficulty hearing due to tinnitus.  Vision? 0  Difficulty concentrating or making decisions? 0  Walking or climbing stairs? 0  Dressing or bathing? 0  Doing errands, shopping? 0  Preparing Food and eating ? N  Using the Toilet? N  In the past six months, have you accidently leaked urine? N  Do you have problems with loss of bowel control? N  Managing your Medications? N  Managing your Finances? N  Housekeeping or managing your Housekeeping? N    Patient Care Team: Tobie Suzzane POUR, MD as PCP - General (Internal Medicine)  I have updated your Care Teams any recent Medical Services you may have received from other providers in the past year.     Assessment:   This is a routine wellness examination for [place].  Hearing/Vision screen Hearing Screening - Comments:: Patient states he has a little  difficulty hearing because he has tinnitus but has learned to make accommodations Vision Screening - Comments:: Patient has retinal exams in the office. Will get patient scheduled to have this completed.    Goals Addressed             This Visit's Progress    Patient Stated       I'd like to do a little bit more traveling.        Depression Screen     03/05/2024   12:49 PM 09/19/2023    9:54 AM 03/17/2023    9:56 AM 02/28/2023    1:10 PM 09/16/2022   10:39 AM 03/15/2022   10:03 AM 02/12/2022    1:17 PM  PHQ 2/9 Scores  PHQ - 2 Score 0 0 0 0 0 0 0  PHQ- 9 Score 0 0         Fall Risk     03/05/2024   12:47 PM 09/19/2023    9:54 AM 03/17/2023    9:56 AM 02/28/2023    1:08 PM 09/16/2022   10:39 AM  Fall Risk   Falls in the past year? 0 0 0 0 0  Number falls in past yr: 0 0 0 0 0  Injury with Fall? 0 0 0 0 0  Risk for fall due to : No Fall Risks No Fall Risks  No Fall Risks   Follow up Falls evaluation completed;Education provided;Falls prevention discussed Falls evaluation completed  Falls prevention discussed     MEDICARE RISK AT HOME:  Medicare Risk at Home Any stairs in or around the home?: Yes If so, are there any without handrails?: No Home free of loose throw rugs in walkways, pet beds, electrical cords, etc?: Yes Adequate lighting in your home to reduce risk of falls?: Yes Life alert?: No Use of a cane, walker or w/c?: No Grab bars in the bathroom?: No Shower chair or bench in shower?: Yes Elevated toilet seat or a handicapped toilet?: Yes  TIMED UP AND GO:  Was the  test performed?  No  Cognitive Function: 6CIT completed        03/05/2024   12:48 PM 02/28/2023    1:09 PM 02/12/2022    1:22 PM  6CIT Screen  What Year? 0 points 0 points 0 points  What month? 0 points 0 points 0 points  What time? 0 points 0 points 0 points  Count back from 20 0 points 0 points 0 points  Months in reverse 0 points 0 points 0 points  Repeat phrase 0 points 0 points 0 points   Total Score 0 points 0 points 0 points    Immunizations  There is no immunization history on file for this patient.  Screening Tests Health Maintenance  Topic Date Due   DTaP/Tdap/Td (1 - Tdap) Never done   Pneumococcal Vaccine: 50+ Years (1 of 2 - PCV) Never done   Zoster Vaccines- Shingrix (1 of 2) Never done   COVID-19 Vaccine (1 - 2024-25 season) Never done   INFLUENZA VACCINE  03/02/2024   OPHTHALMOLOGY EXAM  03/27/2024 (Originally 05/28/2022)   FOOT EXAM  03/16/2024   HEMOGLOBIN A1C  04/20/2024   Fecal DNA (Cologuard)  09/30/2024   Lung Cancer Screening  10/18/2024   Diabetic kidney evaluation - eGFR measurement  10/18/2024   Diabetic kidney evaluation - Urine ACR  10/18/2024   Medicare Annual Wellness (AWV)  03/05/2025   Hepatitis C Screening  Completed   Hepatitis B Vaccines  Aged Out   HPV VACCINES  Aged Out   Meningococcal B Vaccine  Aged Out   Colonoscopy  Discontinued    Health Maintenance  Health Maintenance Due  Topic Date Due   DTaP/Tdap/Td (1 - Tdap) Never done   Pneumococcal Vaccine: 50+ Years (1 of 2 - PCV) Never done   Zoster Vaccines- Shingrix (1 of 2) Never done   COVID-19 Vaccine (1 - 2024-25 season) Never done   INFLUENZA VACCINE  03/02/2024   Health Maintenance Items Addressed: Patient is due for a diabetic retinal exam. Willing to have completed in office. Will get that scheduled for him.   Additional Screening:  Vision Screening: Recommended annual ophthalmology exams for early detection of glaucoma and other disorders of the eye. Would you like a referral to an eye doctor? No    Dental Screening: Recommended annual dental exams for proper oral hygiene  Community Resource Referral / Chronic Care Management: CRR required this visit?  No   CCM required this visit?  No   Plan:    I have personally reviewed and noted the following in the patient's chart:   Medical and social history Use of alcohol, tobacco or illicit drugs   Current medications and supplements including opioid prescriptions. Patient is not currently taking opioid prescriptions. Functional ability and status Nutritional status Physical activity Advanced directives List of other physicians Hospitalizations, surgeries, and ER visits in previous 12 months Vitals Screenings to include cognitive, depression, and falls Referrals and appointments  In addition, I have reviewed and discussed with patient certain preventive protocols, quality metrics, and best practice recommendations. A written personalized care plan for preventive services as well as general preventive health recommendations were provided to patient.   Briella Hobday, CMA   03/05/2024   After Visit Summary: (Mail) Due to this being a telephonic visit, the after visit summary with patients personalized plan was offered to patient via mail   Notes: Nothing significant to report at this time.

## 2024-03-05 NOTE — Patient Instructions (Signed)
 Mr. Tabora , Thank you for taking time out of your busy schedule to complete your Annual Wellness Visit with me. I enjoyed our conversation and look forward to speaking with you again next year. I, as well as your care team,  appreciate your ongoing commitment to your health goals. Please review the following plan we discussed and let me know if I can assist you in the future. Your Game plan/ To Do List    Referrals: If you haven't heard from the office you've been referred to, please reach out to them at the phone provided.  N/A Follow up Visits: We will see or speak with you next year for your Next Medicare AWV with our clinical staff  Clinician Recommendations:  Aim for 30 minutes of exercise or brisk walking, 6-8 glasses of water, and 5 servings of fruits and vegetables each day.    Wishing you many blessings and good health during the next year until our next visit.  -Fayrene Towner   This is a list of the screenings recommended for you:  Health Maintenance  Topic Date Due   DTaP/Tdap/Td vaccine (1 - Tdap) Never done   Pneumococcal Vaccine for age over 67 (1 of 2 - PCV) Never done   Zoster (Shingles) Vaccine (1 of 2) Never done   COVID-19 Vaccine (1 - 2024-25 season) Never done   Flu Shot  03/02/2024   Eye exam for diabetics  03/27/2024*   Complete foot exam   03/16/2024   Hemoglobin A1C  04/20/2024   Cologuard (Stool DNA test)  09/30/2024   Screening for Lung Cancer  10/18/2024   Yearly kidney function blood test for diabetes  10/18/2024   Yearly kidney health urinalysis for diabetes  10/18/2024   Medicare Annual Wellness Visit  03/05/2025   Hepatitis C Screening  Completed   Hepatitis B Vaccine  Aged Out   HPV Vaccine  Aged Out   Meningitis B Vaccine  Aged Out   Colon Cancer Screening  Discontinued  *Topic was postponed. The date shown is not the original due date.   We will mail the Advanced Directives packet to you because you visit today was a virtual visit.   Advanced  directives: (Provided) Advance directive discussed with you today. I have provided a copy for you to complete at home and have notarized. Once this is complete, please bring a copy in to our office so we can scan it into your chart.  Advance Care Planning is important because it:  [x]  Makes sure you receive the medical care that is consistent with your values, goals, and preferences  [x]  It provides guidance to your family and loved ones and reduces their decisional burden about whether or not they are making the right decisions based on your wishes.  Follow the link provided in your after visit summary or read over the paperwork we have mailed to you to help you started getting your Advance Directives in place. If you need assistance in completing these, please reach out to us  so that we can help you!  See attachments for Preventive Care and Fall Prevention Tips.

## 2024-03-07 ENCOUNTER — Telehealth: Payer: Self-pay | Admitting: Internal Medicine

## 2024-03-07 NOTE — Telephone Encounter (Signed)
 Patient called needs to know does he need to do blood work before his next appointment. Has done blood work that was ordered in March 2025. There is another order from February 2025.

## 2024-03-07 NOTE — Telephone Encounter (Signed)
 Patient informed.

## 2024-03-09 ENCOUNTER — Other Ambulatory Visit: Payer: Self-pay | Admitting: Vascular Surgery

## 2024-03-09 DIAGNOSIS — I714 Abdominal aortic aneurysm, without rupture, unspecified: Secondary | ICD-10-CM

## 2024-03-09 DIAGNOSIS — I739 Peripheral vascular disease, unspecified: Secondary | ICD-10-CM

## 2024-03-09 DIAGNOSIS — I7143 Infrarenal abdominal aortic aneurysm, without rupture: Secondary | ICD-10-CM

## 2024-03-09 DIAGNOSIS — I70219 Atherosclerosis of native arteries of extremities with intermittent claudication, unspecified extremity: Secondary | ICD-10-CM

## 2024-03-21 ENCOUNTER — Ambulatory Visit: Admitting: Internal Medicine

## 2024-03-21 ENCOUNTER — Encounter: Payer: Self-pay | Admitting: Internal Medicine

## 2024-03-21 VITALS — BP 124/71 | HR 86 | Ht 71.0 in | Wt 185.0 lb

## 2024-03-21 DIAGNOSIS — E782 Mixed hyperlipidemia: Secondary | ICD-10-CM

## 2024-03-21 DIAGNOSIS — J449 Chronic obstructive pulmonary disease, unspecified: Secondary | ICD-10-CM | POA: Diagnosis not present

## 2024-03-21 DIAGNOSIS — M19042 Primary osteoarthritis, left hand: Secondary | ICD-10-CM

## 2024-03-21 DIAGNOSIS — I10 Essential (primary) hypertension: Secondary | ICD-10-CM | POA: Diagnosis not present

## 2024-03-21 DIAGNOSIS — I7143 Infrarenal abdominal aortic aneurysm, without rupture: Secondary | ICD-10-CM

## 2024-03-21 DIAGNOSIS — N401 Enlarged prostate with lower urinary tract symptoms: Secondary | ICD-10-CM

## 2024-03-21 DIAGNOSIS — E1169 Type 2 diabetes mellitus with other specified complication: Secondary | ICD-10-CM | POA: Diagnosis not present

## 2024-03-21 DIAGNOSIS — R351 Nocturia: Secondary | ICD-10-CM

## 2024-03-21 DIAGNOSIS — K219 Gastro-esophageal reflux disease without esophagitis: Secondary | ICD-10-CM

## 2024-03-21 DIAGNOSIS — K449 Diaphragmatic hernia without obstruction or gangrene: Secondary | ICD-10-CM

## 2024-03-21 MED ORDER — FAMOTIDINE 20 MG PO TABS: 20.0000 mg | ORAL_TABLET | Freq: Every day | ORAL | 3 refills | Status: DC

## 2024-03-21 MED ORDER — DICLOFENAC SODIUM 1 % EX GEL: 4.0000 g | Freq: Four times a day (QID) | CUTANEOUS | 1 refills | Status: AC

## 2024-03-21 NOTE — Assessment & Plan Note (Addendum)
 Mild swelling and stiffness of MCP joint of the left index finger, likely due to OA Advised to apply Voltaren  gel as needed Simple hand exercises advised

## 2024-03-21 NOTE — Assessment & Plan Note (Signed)
 Lab Results  Component Value Date   HGBA1C 6.3 (H) 10/19/2023    Associated with HLD and HTN Well-controlled with low-carb diet alone Advised to follow diabetic diet On statin and ACEi F/u CMP and lipid panel Diabetic eye exam: Advised to follow up with Ophthalmology for diabetic eye exam

## 2024-03-21 NOTE — Assessment & Plan Note (Signed)
 Epigastric discomfort/burning sensation after eating greasy food or caffeinated product likely due to chronic gastritis Added Pepcid  as needed for now If persistent or worse symptoms, will refer to GI

## 2024-03-21 NOTE — Progress Notes (Signed)
 Established Patient Office Visit  Subjective:  Patient ID: Daniel Chung, male    DOB: 1956/07/09  Age: 68 y.o. MRN: [id number]  CC:  Chief Complaint  Patient presents with   Medical Management of Chronic Issues    5 month f/u , has concerns about hiatal hernia.    Hand Pain    Reports sx of hand pain thinks it could be arthritis.     HPI Arth Nicastro is a 68 y.o. male with past medical history of HTN, HLD, type 2 DM and GERD who presents for f/u of his chronic medical conditions.  HTN: BP is well-controlled. Takes medications regularly. Patient denies headache, dizziness, chest pain, dyspnea or palpitations.   Type II DM with HLD: His HbA1c was stable at 6.3 in 03/25, but had improved to 6.1 previously.  He has been following strict low-carb diet.  Denies any fatigue, polyuria or polydipsia currently.  PAD: He is on aspirin  and statin currently.  Denies any foot pain, numbness or pallor of the feet.  He has squeezing sensation in the left toes area, which is chronic since his left femoropopliteal bypass surgery in 2011.  He has seen vascular surgeon in 08/24.  GERD: He reports intermittent epigastric discomfort, especially after eating greasy food or taking coffee.  He has episodic burning pain, but denies any nausea, vomiting or dysphagia.  He is concerned about hiatal hernia, noted on CT chest.  OA of hand: He reports stiffness, swelling and mild pain of the MCP joint of left index finger, which is worse after repetitive movement of the hand.  Denies any recent injury.  Past Medical History:  Diagnosis Date   Anginal pain (HCC)    Arthritis    Emphysema lung (HCC)    GERD (gastroesophageal reflux disease)    Hyperlipidemia    Hypertension    Peripheral neuropathy    Tinnitus     Past Surgical History:  Procedure Laterality Date   COLONOSCOPY WITH PROPOFOL  N/A 01/25/2022   Procedure: COLONOSCOPY WITH PROPOFOL ;  Surgeon: Cindie Carlin POUR, DO;  Location: [facility];   Service: Endoscopy;  Laterality: N/A;  9:15 / ASA 3   ELBOW SURGERY Left 1991   for tennis elbow   FEMORAL-POPLITEAL BYPASS GRAFT Left 2011   [place], [place]    POLYPECTOMY  01/25/2022   Procedure: POLYPECTOMY;  Surgeon: Cindie Carlin POUR, DO;  Location: [facility];  Service: Endoscopy;;   TONSILLECTOMY  age 60   VASECTOMY      Family History  Problem Relation Age of Onset   Scleroderma Mother    Diabetes Father    Hypertension Father    Cancer Sister    COPD Sister    Cancer Brother     Social History   Socioeconomic History   Marital status: Divorced    Spouse name: Not on file   Number of children: 3   Years of education: Not on file   Highest education level: Associate degree: occupational, Scientist, product/process development, or vocational program  Occupational History    Comment: Retired   Tobacco Use   Smoking status: Former    Current packs/day: 0.00    Average packs/day: 1.5 packs/day for 50.0 years (75.0 ttl pk-yrs)    Types: Cigarettes    Start date: 03/07/1968    Quit date: 03/07/2018    Years since quitting: 6.0   Smokeless tobacco: Never  Vaping Use   Vaping status: Never Used  Substance and Sexual Activity   Alcohol use: Yes  Comment: rare social   Drug use: Yes    Frequency: 2.0 times per week    Types: Marijuana, PCP    Comment: per week  last smoked on 01/19/22   Sexual activity: Yes  Other Topics Concern   Not on file  Social History Narrative   Lives alone   3 children: 2 in [place], 1 in [place]      Enjoys: casino, auto racing-short tracks      Diet: lunch is biggest meal, eats all food groups   Caffeine: none really    Water: 4 16 oz bottles      Wears seat belt   Does not use phone while drinking   Psychologist, sport and exercise    Social Drivers of Health   Financial Resource Strain: Low Risk  (03/05/2024)   Overall Financial Resource Strain (CARDIA)    Difficulty of Paying Living Expenses: Not hard at all  Food Insecurity: No Food Insecurity (03/05/2024)   Hunger Vital Sign     Worried About Running Out of Food in the Last Year: Never true    Ran Out of Food in the Last Year: Never true  Transportation Needs: No Transportation Needs (03/05/2024)   PRAPARE - Administrator, Civil Service (Medical): No    Lack of Transportation (Non-Medical): No  Physical Activity: Sufficiently Active (03/05/2024)   Exercise Vital Sign    Days of Exercise per Week: 7 days    Minutes of Exercise per Session: 30 min  Stress: No Stress Concern Present (03/05/2024)   [employer] of Occupational Health - Occupational Stress Questionnaire    Feeling of Stress: Not at all  Social Connections: Socially Isolated (03/05/2024)   Social Connection and Isolation Panel    Frequency of Communication with Friends and Family: More than three times a week    Frequency of Social Gatherings with Friends and Family: More than three times a week    Attends Religious Services: Never    Database administrator or Organizations: No    Attends Banker Meetings: Never    Marital Status: Divorced  Catering manager Violence: Not At Risk (03/05/2024)   Humiliation, Afraid, Rape, and Kick questionnaire    Fear of Current or Ex-Partner: No    Emotionally Abused: No    Physically Abused: No    Sexually Abused: No    Outpatient Medications Prior to Visit  Medication Sig Dispense Refill   amLODipine  (NORVASC ) 10 MG tablet Take 1 tablet (10 mg total) by mouth daily. 90 tablet 3   aspirin  81 MG EC tablet Take 1 tablet (81 mg total) by mouth daily. Swallow whole. 30 tablet 12   atorvastatin  (LIPITOR) 80 MG tablet Take 1 tablet (80 mg total) by mouth daily. 90 tablet 3   Calcium  Carbonate Antacid (TUMS CHEWY BITES PO) Take 1 tablet by mouth daily as needed (heartburn or indigestion).     lisinopril  (ZESTRIL ) 40 MG tablet Take 1 tablet (40 mg total) by mouth daily. 90 tablet 3   PROVENTIL  HFA 108 (90 Base) MCG/ACT inhaler INHALE 2 PUFFS BY MOUTH EVERY 6 HOURS AS NEEDED FOR WHEEZING OR  SHORTNESS OF BREATH 20.1 g 2   No facility-administered medications prior to visit.    No Known Allergies  ROS Review of Systems  Constitutional:  Negative for chills and fever.  HENT:  Negative for congestion and sore throat.   Eyes:  Negative for pain and discharge.  Respiratory:  Negative for cough and shortness  of breath.   Cardiovascular:  Negative for chest pain and palpitations.  Gastrointestinal:  Negative for constipation, diarrhea, nausea and vomiting.  Endocrine: Negative for polydipsia and polyuria.  Genitourinary:  Negative for dysuria and hematuria.  Musculoskeletal:  Negative for neck pain and neck stiffness.  Skin:  Negative for rash.  Neurological:  Negative for dizziness, weakness, numbness and headaches.  Psychiatric/Behavioral:  Negative for agitation and behavioral problems.       Objective:    Physical Exam Vitals reviewed.  Constitutional:      General: He is not in acute distress.    Appearance: He is not diaphoretic.  HENT:     Head: Normocephalic and atraumatic.     Nose: Nose normal.     Mouth/Throat:     Mouth: Mucous membranes are moist.  Eyes:     General: No scleral icterus.    Extraocular Movements: Extraocular movements intact.  Cardiovascular:     Rate and Rhythm: Normal rate and regular rhythm.     Heart sounds: Normal heart sounds. No murmur heard. Pulmonary:     Breath sounds: Normal breath sounds. No wheezing or rales.  Musculoskeletal:     Cervical back: Neck supple. No tenderness.     Right lower leg: No edema.     Left lower leg: No edema.     Comments: Mild swelling of MCP joint of left index finger  Skin:    General: Skin is warm.     Findings: No rash.  Neurological:     General: No focal deficit present.     Mental Status: He is alert and oriented to person, place, and time.     Sensory: No sensory deficit.     Motor: No weakness.  Psychiatric:        Mood and Affect: Mood normal.        Behavior: Behavior normal.      BP 124/71   Pulse 86   Ht 5' 11 (1.803 m)   Wt 185 lb (83.9 kg)   SpO2 94%   BMI 25.80 kg/m  Wt Readings from Last 3 Encounters:  03/21/24 185 lb (83.9 kg)  03/05/24 185 lb (83.9 kg)  09/19/23 188 lb 12.8 oz (85.6 kg)    Lab Results  Component Value Date   TSH 1.340 10/19/2023   Lab Results  Component Value Date   WBC 9.9 10/19/2023   HGB 15.7 10/19/2023   HCT 45.4 10/19/2023   MCV 92.7 10/19/2023   PLT 224 10/19/2023   Lab Results  Component Value Date   NA 133 (L) 10/19/2023   K 4.1 10/19/2023   CO2 22 10/19/2023   GLUCOSE 121 (H) 10/19/2023   BUN 17 10/19/2023   CREATININE 0.95 10/19/2023   BILITOT 0.8 10/19/2023   ALKPHOS 78 10/19/2023   AST 19 10/19/2023   ALT 19 10/19/2023   PROT 7.4 10/19/2023   ALBUMIN 4.1 10/19/2023   CALCIUM  9.4 10/19/2023   ANIONGAP 11 10/19/2023   EGFR 77 03/15/2022   Lab Results  Component Value Date   CHOL 117 10/19/2023   Lab Results  Component Value Date   HDL 29 (L) 10/19/2023   Lab Results  Component Value Date   LDLCALC 69 10/19/2023   Lab Results  Component Value Date   TRIG 97 10/19/2023   Lab Results  Component Value Date   CHOLHDL 4.0 10/19/2023   Lab Results  Component Value Date   HGBA1C 6.3 (H) 10/19/2023  Assessment & Plan:   Problem List Items Addressed This Visit       Cardiovascular and Mediastinum   Essential hypertension - Primary   BP Readings from Last 1 Encounters:  03/21/24 124/71   Well-controlled with Amlodipine  and Lisinopril  Counseled for compliance with the medications Advised DASH diet and moderate exercise/walking, at least 150 mins/week      AAA (abdominal aortic aneurysm) (HCC)   Low dose CT chest (06/23) - Aneurysmal dilatation of the infrarenal abdominal aorta measuring 3.1 x 3.0 cm. Recommend follow-up ultrasound every 3 years.        Respiratory   Chronic obstructive pulmonary disease (HCC)   Well controlled with as needed albuterol       Hiatal  hernia   Small hiatal hernia noted on CT chest, incidental finding His symptoms are more suggestive of chronic gastritis rather than hiatal hernia        Digestive   GERD (gastroesophageal reflux disease)   Epigastric discomfort/burning sensation after eating greasy food or caffeinated product likely due to chronic gastritis Added Pepcid  as needed for now If persistent or worse symptoms, will refer to GI      Relevant Medications   famotidine  (PEPCID ) 20 MG tablet     Endocrine   Type 2 diabetes mellitus with other specified complication (HCC)   Lab Results  Component Value Date   HGBA1C 6.3 (H) 10/19/2023    Associated with HLD and HTN Well-controlled with low-carb diet alone Advised to follow diabetic diet On statin and ACEi F/u CMP and lipid panel Diabetic eye exam: Advised to follow up with Ophthalmology for diabetic eye exam      Relevant Orders   Bayer DCA Hb A1c Waived     Musculoskeletal and Integument   Primary osteoarthritis of left hand   Mild swelling and stiffness of MCP joint of the left index finger, likely due to OA Advised to apply Voltaren  gel as needed Simple hand exercises advised      Relevant Medications   diclofenac  Sodium (VOLTAREN  ARTHRITIS PAIN) 1 % GEL     Other   Hyperlipidemia   Checked lipid profile Continue atorvastatin  80 mg QD Needs to comply with DASH diet      Benign prostatic hyperplasia with nocturia   Mild nocturia PSA wnl Symptoms better recently         Meds ordered this encounter  Medications   famotidine  (PEPCID ) 20 MG tablet    Sig: Take 1 tablet (20 mg total) by mouth daily.    Dispense:  30 tablet    Refill:  3   diclofenac  Sodium (VOLTAREN  ARTHRITIS PAIN) 1 % GEL    Sig: Apply 4 g topically 4 (four) times daily.    Dispense:  100 g    Refill:  1    Follow-up: Return in about 6 months (around 09/21/2024) for HTN and DM.    Suzzane MARLA Blanch, MD

## 2024-03-21 NOTE — Patient Instructions (Addendum)
 Please start taking Famotidine  (Pepcid ) for acid reflux.  Please use Voltaren  gel as needed for hand pain.  Please continue to take medications as prescribed.  Please continue to follow low carb diet and perform moderate exercise/walking at least 150 mins/week.

## 2024-03-21 NOTE — Assessment & Plan Note (Signed)
 BP Readings from Last 1 Encounters:  03/21/24 124/71   Well-controlled with Amlodipine  and Lisinopril  Counseled for compliance with the medications Advised DASH diet and moderate exercise/walking, at least 150 mins/week

## 2024-03-21 NOTE — Assessment & Plan Note (Signed)
Well controlled with as needed albuterol 

## 2024-03-21 NOTE — Assessment & Plan Note (Signed)
Low dose CT chest (06/23) - Aneurysmal dilatation of the infrarenal abdominal aorta measuring 3.1 x 3.0 cm. Recommend follow-up ultrasound every 3 years.

## 2024-03-21 NOTE — Assessment & Plan Note (Signed)
 Checked lipid profile Continue atorvastatin  80 mg QD Needs to comply with DASH diet

## 2024-03-21 NOTE — Assessment & Plan Note (Signed)
 Small hiatal hernia noted on CT chest, incidental finding His symptoms are more suggestive of chronic gastritis rather than hiatal hernia

## 2024-03-21 NOTE — Assessment & Plan Note (Signed)
Mild nocturia PSA wnl Symptoms better recently

## 2024-03-23 ENCOUNTER — Ambulatory Visit: Payer: Self-pay | Admitting: Internal Medicine

## 2024-03-23 LAB — BAYER DCA HB A1C WAIVED: HB A1C (BAYER DCA - WAIVED): 6.2 % — ABNORMAL HIGH (ref 4.8–5.6)

## 2024-04-18 ENCOUNTER — Ambulatory Visit (HOSPITAL_COMMUNITY)
Admission: RE | Admit: 2024-04-18 | Discharge: 2024-04-18 | Disposition: A | Discharge status: Home / Self Care | Source: Ambulatory Visit | Attending: Vascular Surgery | Admitting: Vascular Surgery

## 2024-04-18 ENCOUNTER — Ambulatory Visit (INDEPENDENT_AMBULATORY_CARE_PROVIDER_SITE_OTHER)

## 2024-04-18 ENCOUNTER — Ambulatory Visit (HOSPITAL_BASED_OUTPATIENT_CLINIC_OR_DEPARTMENT_OTHER)
Admission: RE | Admit: 2024-04-18 | Discharge: 2024-04-18 | Discharge status: Home / Self Care | Source: Ambulatory Visit | Attending: Vascular Surgery

## 2024-04-18 ENCOUNTER — Other Ambulatory Visit: Payer: Self-pay

## 2024-04-18 VITALS — BP 143/76 | HR 69 | Temp 97.7°F | Wt 185.0 lb

## 2024-04-18 DIAGNOSIS — I714 Abdominal aortic aneurysm, without rupture, unspecified: Secondary | ICD-10-CM | POA: Insufficient documentation

## 2024-04-18 DIAGNOSIS — I7143 Infrarenal abdominal aortic aneurysm, without rupture: Secondary | ICD-10-CM | POA: Insufficient documentation

## 2024-04-18 DIAGNOSIS — I70219 Atherosclerosis of native arteries of extremities with intermittent claudication, unspecified extremity: Secondary | ICD-10-CM | POA: Insufficient documentation

## 2024-04-18 DIAGNOSIS — I724 Aneurysm of artery of lower extremity: Secondary | ICD-10-CM | POA: Diagnosis present

## 2024-04-18 DIAGNOSIS — I70212 Atherosclerosis of native arteries of extremities with intermittent claudication, left leg: Secondary | ICD-10-CM

## 2024-04-18 DIAGNOSIS — I739 Peripheral vascular disease, unspecified: Secondary | ICD-10-CM

## 2024-04-18 LAB — VAS US ABI WITH/WO TBI
Left ABI: 0.92
Right ABI: 0.95

## 2024-04-18 NOTE — Addendum Note (Signed)
 Addended by: RAYNA MOATS A on: 04/18/2024 02:09 PM   Modules accepted: Orders

## 2024-04-18 NOTE — Progress Notes (Addendum)
 HISTORY AND PHYSICAL     CC:  follow up. Requesting Provider:  Tobie Suzzane POUR, MD  HPI: [name] who is here today for follow up for PAD.  Pt has hx of a procedure in his left groin around 2011 in [place] . He is not sure exactly what was done. He has now moved to this area and comes to establish vascular care. He was also told that he had a small 2.9 cm infrarenal abdominal aortic aneurysm.   Pt was last seen 03/25/2023 by Dr. Eliza and at that time, he was not having any claudication, rest pain or non healing ulcers.  He quit smoking in 2019.   The pt returns today for follow up.  He states he has had some numbness in the left leg and foot since his surgery in 2011.  He states he feels like there is a band around his toes.  He states that the surgery was for a blockage that they fixed with a cow artery.  He states they originally thought the blockage was behind his knee but was in the groin.  He is compliant with his asa/statin.  He quit smoking ~7 years ago.    The pt is on a statin for cholesterol management.    The pt is on an aspirin .    Other AC:  none The pt is on ACEI, CCB for hypertension.  The pt is not on diabetic medication. Tobacco hx:  former  Pt does not have family hx of AAA.  Past Medical History:  Diagnosis Date   Anginal pain (HCC)    Arthritis    Emphysema lung (HCC)    GERD (gastroesophageal reflux disease)    Hyperlipidemia    Hypertension    Peripheral neuropathy    Tinnitus     Past Surgical History:  Procedure Laterality Date   COLONOSCOPY WITH PROPOFOL  N/A 01/25/2022   Procedure: COLONOSCOPY WITH PROPOFOL ;  Surgeon: Cindie Carlin POUR, DO;  [facility];  Service: Endoscopy;  Laterality: N/A;  9:15 / ASA 3   ELBOW SURGERY Left 1991   for tennis elbow   FEMORAL-POPLITEAL BYPASS GRAFT Left 2011   [place], [place]    POLYPECTOMY  01/25/2022   Procedure: POLYPECTOMY;  Surgeon: Cindie Carlin POUR, DO;  [facility];   Service: Endoscopy;;   TONSILLECTOMY  age 76   VASECTOMY      No Known Allergies  Current Outpatient Medications  Medication Sig Dispense Refill   amLODipine  (NORVASC ) 10 MG tablet Take 1 tablet (10 mg total) by mouth daily. 90 tablet 3   aspirin  81 MG EC tablet Take 1 tablet (81 mg total) by mouth daily. Swallow whole. 30 tablet 12   atorvastatin  (LIPITOR) 80 MG tablet Take 1 tablet (80 mg total) by mouth daily. 90 tablet 3   Calcium  Carbonate Antacid (TUMS CHEWY BITES PO) Take 1 tablet by mouth daily as needed (heartburn or indigestion).     diclofenac  Sodium (VOLTAREN  ARTHRITIS PAIN) 1 % GEL Apply 4 g topically 4 (four) times daily. 100 g 1   famotidine  (PEPCID ) 20 MG tablet Take 1 tablet (20 mg total) by mouth daily. 30 tablet 3   lisinopril  (ZESTRIL ) 40 MG tablet Take 1 tablet (40 mg total) by mouth daily. 90 tablet 3   PROVENTIL  HFA 108 (90 Base) MCG/ACT inhaler INHALE 2 PUFFS BY MOUTH EVERY 6 HOURS AS NEEDED FOR WHEEZING OR SHORTNESS OF BREATH 20.1 g 2   No current  facility-administered medications for this visit.    Family History  Problem Relation Age of Onset   Scleroderma Mother    Diabetes Father    Hypertension Father    Cancer Sister    COPD Sister    Cancer Brother     Social History   Socioeconomic History   Marital status: Divorced    Spouse name: Not on file   Number of children: 3   Years of education: Not on file   Highest education level: Associate degree: occupational, Scientist, product/process development, or vocational program  Occupational History    Comment: Retired   Tobacco Use   Smoking status: Former    Current packs/day: 0.00    Average packs/day: 1.5 packs/day for 50.0 years (75.0 ttl pk-yrs)    Types: Cigarettes    Start date: 03/07/1968    Quit date: 03/07/2018    Years since quitting: 6.1   Smokeless tobacco: Never  Vaping Use   Vaping status: Never Used  Substance and Sexual Activity   Alcohol use: Yes    Comment: rare social   Drug use: Yes    Frequency:  2.0 times per week    Types: Marijuana, PCP    Comment: per week  last smoked on 01/19/22   Sexual activity: Yes  Other Topics Concern   Not on file  Social History Narrative   Lives alone   3 children: 2 in [place], 1 in [place]      Enjoys: casino, auto racing-short tracks      Diet: lunch is biggest meal, eats all food groups   Caffeine: none really    Water: 4 16 oz bottles      Wears seat belt   Does not use phone while drinking   Psychologist, sport and exercise    Social Drivers of Health   Financial Resource Strain: Low Risk  (03/05/2024)   Overall Financial Resource Strain (CARDIA)    Difficulty of Paying Living Expenses: Not hard at all  Food Insecurity: No Food Insecurity (03/05/2024)   Hunger Vital Sign    Worried About Running Out of Food in the Last Year: Never true    Ran Out of Food in the Last Year: Never true  Transportation Needs: No Transportation Needs (03/05/2024)   PRAPARE - Administrator, Civil Service (Medical): No    Lack of Transportation (Non-Medical): No  Physical Activity: Sufficiently Active (03/05/2024)   Exercise Vital Sign    Days of Exercise per Week: 7 days    Minutes of Exercise per Session: 30 min  Stress: No Stress Concern Present (03/05/2024)   [employer] of Occupational Health - Occupational Stress Questionnaire    Feeling of Stress: Not at all  Social Connections: Socially Isolated (03/05/2024)   Social Connection and Isolation Panel    Frequency of Communication with Friends and Family: More than three times a week    Frequency of Social Gatherings with Friends and Family: More than three times a week    Attends Religious Services: Never    Database administrator or Organizations: No    Attends Banker Meetings: Never    Marital Status: Divorced  Catering manager Violence: Not At Risk (03/05/2024)   Humiliation, Afraid, Rape, and Kick questionnaire    Fear of Current or Ex-Partner: No    Emotionally Abused: No    Physically  Abused: No    Sexually Abused: No     REVIEW OF SYSTEMS:   [X]  denotes positive finding, [ ]   denotes negative finding Cardiac  Comments:  Chest pain or chest pressure:    Shortness of breath upon exertion:    Short of breath when lying flat:    Irregular heart rhythm:        Vascular    Pain in calf, thigh, or hip brought on by ambulation:    Pain in feet at night that wakes you up from your sleep:     Blood clot in your veins:    Leg swelling:         Pulmonary    Oxygen at home:    Productive cough:     Wheezing:         Neurologic    Sudden weakness in arms or legs:     Sudden numbness in arms or legs:     Sudden onset of difficulty speaking or slurred speech:    Temporary loss of vision in one eye:     Problems with dizziness:         Gastrointestinal    Blood in stool:     Vomited blood:         Genitourinary    Burning when urinating:     Blood in urine:        Psychiatric    Major depression:         Hematologic    Bleeding problems:    Problems with blood clotting too easily:        Skin    Rashes or ulcers:        Constitutional    Fever or chills:      PHYSICAL EXAMINATION:  Today's Vitals   04/18/24 0951  BP: (!) 143/76  Pulse: 69  Temp: 97.7 F (36.5 C)  TempSrc: Temporal  Weight: 185 lb (83.9 kg)  PainSc: 0-No pain   Body mass index is 25.8 kg/m.   General:  WDWN in NAD; vital signs documented above Gait: Not observed HENT: WNL, normocephalic Pulmonary: normal non-labored breathing , without wheezing Cardiac: regular HR, without carotid bruits Abdomen: soft, NT; aortic pulse is  palpable Skin: without rashes Vascular Exam/Pulses:  Right Left  Radial 2+ (normal) 2+ (normal)  Femoral 2+ (normal) 2+ (normal)  Popliteal Unable to palpate 1+ (weak)  DP 2+ (normal) 2+ (normal)  PT 2+ (normal) Unable to palpate   Extremities: without ischemic changes, without Gangrene , without cellulitis; without open wounds; left femoral  artery easily palpable and feels aneurysmal Musculoskeletal: no muscle wasting or atrophy  Neurologic: A&O X 3 Psychiatric:  The pt has Normal affect.   Non-Invasive Vascular Imaging:   ABI's/TBI's on 04/18/2024: Right:  0.95/0.88 - Great toe pressure: 131 Left:  0.92/0.85 - Great toe pressure: 126  Arterial duplex on 04/18/2024:  LLE duplex: +-----------+--------+-----+--------+---------+-------------------------+  LEFT      PSV cm/sRatioStenosisWaveform Comments                   +-----------+--------+-----+--------+---------+-------------------------+  CFA Prox   63                   biphasic aneurysm 4.1 x 4.1 cm trv  +-----------+--------+-----+--------+---------+-------------------------+  CFA Distal 17                   biphasic                            +-----------+--------+-----+--------+---------+-------------------------+  DFA       121  triphasic                           +-----------+--------+-----+--------+---------+-------------------------+  SFA Prox   147                  biphasic                            +-----------+--------+-----+--------+---------+-------------------------+  SFA Mid    103                  biphasic                            +-----------+--------+-----+--------+---------+-------------------------+  SFA Distal 75                   biphasic                            +-----------+--------+-----+--------+---------+-------------------------+  POP Prox   62                   biphasic                            +-----------+--------+-----+--------+---------+-------------------------+  POP Mid    53                   biphasic 0.6 x 0.6 cm trv           +-----------+--------+-----+--------+---------+-------------------------+  POP Distal 39                   biphasic                            +-----------+--------+-----+--------+---------+-------------------------+   TP Trunk   46                   biphasic                            +-----------+--------+-----+--------+---------+-------------------------+  ATA Distal 24                   biphasic                            +-----------+--------+-----+--------+---------+-------------------------+  PTA Distal 30                   biphasic                            +-----------+--------+-----+--------+---------+-------------------------+  PERO Distal21                   biphasic                            +-----------+--------+-----+--------+---------+-------------------------+   No evidence of a bypass graft was visualized.    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  Atherosclerosis of the left lower extremity arteries without evidence of focal stenosis.   AAA duplex: Abdominal Aorta Findings:  +-------------+-------+----------+----------+----------+--------+----------  ---+  Location    AP (cm)Trans (cm)PSV (cm/s)Waveform  ThrombusComments   +-------------+-------+----------+----------+----------+--------+----------  Proximal    1.70   1.70      79        biphasic                     +-------------+-------+----------+----------+----------+--------+----------  Mid         4.60   4.70      66        monophasic        fusiform   +-------------+-------+----------+----------+----------+--------+----------  Distal      4.80   4.80      20        biphasic          fusiform   +-------------+-------+----------+----------+----------+--------+----------   RT CIA Prox  1.3    1.3       240       triphasic         >50% stenosis  +-------------+-------+----------+----------+----------+--------+----------  RT CIA Mid                    226       triphasic                    +-------------+-------+----------+----------+----------+--------+----------  RT CIA Distal                 171        biphasic                     +-------------+-------+----------+----------+----------+--------+----------  RT EIA Prox                   74        biphasic                     +-------------+-------+----------+----------+----------+--------+----------  RT EIA Mid                    171       triphasic                    +-------------+-------+----------+----------+----------+--------+----------  RT EIA Distal0.9    0.8       103       biphasic                     +-------------+-------+----------+----------+----------+--------+----------  LT CIA Prox  1.3    1.3       227       triphasic                     +-------------+-------+----------+----------+----------+--------+----------  LT CIA Mid                    238       triphasic         >50% stenosis  +-------------+-------+----------+----------+----------+--------+----------  LT CIA Distal                 216       biphasic                     +-------------+-------+----------+----------+----------+--------+----------  LT EIA Prox                   116       biphasic                     +-------------+-------+----------+----------+----------+--------+----------  LT EIA Mid  133       biphasic                     +-------------+-------+----------+----------+----------+--------+----------  LT EIA Distal1.3    1.3       124       biphasic                     +-------------+-------+----------+----------+----------+--------+----------   IVC/Iliac Findings:  +--------+------+--------+--------+   IVC   PatentThrombusComments  +--------+------+--------+--------+  IVC Proxpatent                  +--------+------+--------+--------+    Summary:  Abdominal Aorta: There is evidence of abnormal dilatation of the mid and  distal Abdominal aorta. The largest aortic measurement is 4.8 cm.   Stenosis:  Location          Stenosis        +------------------+-------------+  Right Common Iliac>50% stenosis  +------------------+-------------+  Left Common Iliac >50% stenosis  +------------------+-------------+      IVC/Iliac: There is no evidence of thrombus involving the IVC.   Previous ABI's/TBI's on 03/25/2023: Right:  0.88/0.73 - Great toe pressure: 105 Left:  0.88/0.78 - Great toe pressure:  112     ASSESSMENT/PLAN:: 68 y.o. male here for follow up for PAD with hx of a procedure in his left groin around 2011 in [place] . He is not sure exactly what was done. He has now moved to this area and comes to establish vascular care. He was also told that he had a small 2.9 cm infrarenal abdominal aortic aneurysm and returns today with follow up studies.   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  He has hx of revascularization in the left groin with what sounds like bovine patch and possibly had a femoral endarterectomy.  A bypass was not visualized on duplex today.   -continue asa/statin  AAA -pt AAA on duplex reveals 4.8cm aneurysm.   He states it measured 2.9cm in 2011 but I cannot find where this has [place]. He also has a palpable left popliteal pulse. -discussed with him that at some point, his children wound need to be evaluated for AAA as it can have a familial component.    Given the CFA aneurysm, stenosis of iliac arteries and AAA, we will get a CTA a/p with runoff to for further information as the left groin most likely will need surgical repair.    -pt will f/u in 2-4 weeks with Dr. Sheree -discussed with pt if he develops sudden onset of severe abdominal, back or groin pain, he should call 911.   Lucie Apt, [facility] [phone]  Clinic MD:   Sheree

## 2024-05-14 ENCOUNTER — Ambulatory Visit (HOSPITAL_COMMUNITY)
Admission: RE | Admit: 2024-05-14 | Discharge: 2024-05-14 | Disposition: A | Discharge status: Home / Self Care | Source: Ambulatory Visit | Attending: Vascular Surgery | Admitting: Vascular Surgery

## 2024-05-14 DIAGNOSIS — I70219 Atherosclerosis of native arteries of extremities with intermittent claudication, unspecified extremity: Secondary | ICD-10-CM | POA: Insufficient documentation

## 2024-05-14 MED ORDER — IOHEXOL 350 MG/ML SOLN
125.0000 mL | Freq: Once | INTRAVENOUS | Status: AC | PRN
  Administered 2024-05-14: 125 mL via INTRAVENOUS

## 2024-05-16 ENCOUNTER — Observation Stay: Attending: Vascular Surgery | Admitting: Vascular Surgery

## 2024-05-16 ENCOUNTER — Encounter: Payer: Self-pay | Admitting: Vascular Surgery

## 2024-05-16 ENCOUNTER — Telehealth: Payer: Self-pay

## 2024-05-16 VITALS — BP 134/79 | HR 68 | Temp 98.2°F | Ht 71.0 in | Wt 187.0 lb

## 2024-05-16 DIAGNOSIS — I7143 Infrarenal abdominal aortic aneurysm, without rupture: Secondary | ICD-10-CM | POA: Insufficient documentation

## 2024-05-16 DIAGNOSIS — I724 Aneurysm of artery of lower extremity: Secondary | ICD-10-CM | POA: Insufficient documentation

## 2024-05-16 NOTE — Telephone Encounter (Signed)
   Name: Daniel Chung  DOB: 11/08/1955  MRN: [id number]  Primary Cardiologist: None  Chart reviewed as part of pre-operative protocol coverage. The patient has an upcoming visit scheduled with Georganna Archer A on `0/`01/2024 at which time clearance can be addressed in case there are any issues that would impact surgical recommendations.  LEFT COMMON FEMORAL PSEUDOANEURYSM REPAIR is not scheduled for ASAP. I added preop FYI to appointment note so that provider is aware to address at time of outpatient visit.  Per office protocol the cardiology provider should forward their finalized clearance decision and recommendations regarding antiplatelet therapy to the requesting party below.      I will route this message as FYI to requesting party and remove this message from the preop box as separate preop APP input not needed at this time.   Please call with any questions.  Lamarr Satterfield, NP  05/16/2024, 11:04 AM

## 2024-05-16 NOTE — Progress Notes (Signed)
 Patient ID: Daniel Chung, male   DOB: 1955-12-31, 68 y.o.   MRN: [id number]  Reason for Consult: Follow-up   Referred by Tobie Suzzane POUR, MD  Subjective:     HPI:  Daniel Chung is a 68 y.o. male history of left common femoral endarterectomy for what sounds like claudication in 2011 in [place] .  He also has a known abdominal aortic aneurysm without any family history of aneurysm disease.  More recently he was evaluated for pain in the left groin here and found to have pseudoaneurysm of the left groin.  He states this has been present for approximately 1 year.  He is a former smoker quit about 2017.  He does not have any lower extremity pain walks without limitation other than shortness of breath.  He denies tissue loss or ulceration.  Past Medical History:  Diagnosis Date   Anginal pain    Arthritis    Emphysema lung (HCC)    GERD (gastroesophageal reflux disease)    Hyperlipidemia    Hypertension    Peripheral neuropathy    Tinnitus    Family History  Problem Relation Age of Onset   Scleroderma Mother    Diabetes Father    Hypertension Father    Cancer Sister    COPD Sister    Cancer Brother    Past Surgical History:  Procedure Laterality Date   COLONOSCOPY WITH PROPOFOL  N/A 01/25/2022   Procedure: COLONOSCOPY WITH PROPOFOL ;  Surgeon: Cindie Carlin POUR, DO;  [facility];  Service: Endoscopy;  Laterality: N/A;  9:15 / ASA 3   ELBOW SURGERY Left 1991   for tennis elbow   FEMORAL-POPLITEAL BYPASS GRAFT Left 2011   [place], [place]    POLYPECTOMY  01/25/2022   Procedure: POLYPECTOMY;  Surgeon: Cindie Carlin POUR, DO;  [facility];  Service: Endoscopy;;   TONSILLECTOMY  age 53   VASECTOMY      Short Social History:  Social History   Tobacco Use   Smoking status: Former    Current packs/day: 0.00    Average packs/day: 1.5 packs/day for 50.0 years (75.0 ttl pk-yrs)    Types: Cigarettes    Start date: 03/07/1968    Quit date: 03/07/2018    Years since  quitting: 6.1   Smokeless tobacco: Never  Substance Use Topics   Alcohol use: Yes    Comment: rare social    No Known Allergies  Current Outpatient Medications  Medication Sig Dispense Refill   amLODipine  (NORVASC ) 10 MG tablet Take 1 tablet (10 mg total) by mouth daily. 90 tablet 3   aspirin  81 MG EC tablet Take 1 tablet (81 mg total) by mouth daily. Swallow whole. 30 tablet 12   atorvastatin  (LIPITOR) 80 MG tablet Take 1 tablet (80 mg total) by mouth daily. 90 tablet 3   Calcium  Carbonate Antacid (TUMS CHEWY BITES PO) Take 1 tablet by mouth daily as needed (heartburn or indigestion).     diclofenac  Sodium (VOLTAREN  ARTHRITIS PAIN) 1 % GEL Apply 4 g topically 4 (four) times daily. 100 g 1   famotidine  (PEPCID ) 20 MG tablet Take 1 tablet (20 mg total) by mouth daily. 30 tablet 3   lisinopril  (ZESTRIL ) 40 MG tablet Take 1 tablet (40 mg total) by mouth daily. 90 tablet 3   PROVENTIL  HFA 108 (90 Base) MCG/ACT inhaler INHALE 2 PUFFS BY MOUTH EVERY 6 HOURS AS NEEDED FOR WHEEZING OR SHORTNESS OF BREATH 20.1 g 2   No current facility-administered medications for this  visit.    Review of Systems  Constitutional:  Constitutional negative. HENT: HENT negative.  Eyes: Eyes negative.  Respiratory: Positive for shortness of breath.  Cardiovascular: Cardiovascular negative.  GI: Gastrointestinal negative.  Skin: Skin negative.  Neurological: Neurological negative. Hematologic: Hematologic/lymphatic negative.  Psychiatric: Psychiatric negative.        Objective:  Objective   Vitals:   05/16/24 0835  BP: 134/79  Pulse: 68  Temp: 98.2 F (36.8 C)  SpO2: 95%  Weight: 187 lb (84.8 kg)  Height: 5' 11 (1.803 m)   Body mass index is 26.08 kg/m.  Physical Exam HENT:     Head: Normocephalic.     Right Ear: Tympanic membrane normal.     Nose: Nose normal.  Cardiovascular:     Rate and Rhythm: Normal rate.     Pulses:          Femoral pulses are 2+ on the right side and 3+ on the  left side. Abdominal:     Palpations: Abdomen is soft.     Comments: Aneurysm is palpable  Musculoskeletal:        General: Normal range of motion.     Right lower leg: No edema.     Left lower leg: No edema.  Skin:    General: Skin is warm.     Capillary Refill: Capillary refill takes less than 2 seconds.  Neurological:     General: No focal deficit present.     Mental Status: He is alert.     Data: CT IMPRESSION: VASCULAR   1. Fusiform infrarenal abdominal aortic aneurysm measuring up to 4.6 cm. Recommend follow-up CT or MR as appropriate in 12 months and referral to or continued care with vascular specialist. (Ref.: J Vasc Surg. 2018; 67:2-77 and J Am Coll Radiol 2013;10(10):789-794.) 2. 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 3. 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 4. Patent 3 vessel left lower extremity runoff with gradual tapering of the distal anterior tibial artery at the level of the ankle. 5. Diminutive proximal right posterior tibial artery with distal reconstitution. 6.  Aortic Atherosclerosis (ICD10-I70.0).       Assessment/Plan:    68 year old male with history of left common femoral endarterectomy with what sounds like bovine pericardial patch angioplasty approximately 15 years ago in [place] .  He does not have any recurrent symptoms but has obvious left common femoral pseudoaneurysm with what appears to be patch dehiscence.  We discussed that this could represent an indolent infectious process although without fevers or erythema this is unlikely.  We discussed the need for repair to prevent rupture or thrombosis.  We discussed the options for repair and need for repeat exposure of the common femoral artery as well as its branch vessels, removal of the patch and placement of vein if available as patch angioplasty.  We have discussed  that this will leave a potential space in his left groin which has increased risk of infection particularly given that his repeat exposure.  We discussed the risk given the patient has no cardiologist of record we will have him evaluated by cardiology.  Will then plan for repeat common femoral exposure and repair of pseudoaneurysm with saphenous vein.  All questions were answered he demonstrates good understanding.     Penne Lonni [place] MD [facility]

## 2024-05-16 NOTE — Telephone Encounter (Signed)
 Pt has new pt appt 05/17/24 with DR. Floretta. I will update all parties involved.

## 2024-05-16 NOTE — H&P (View-Only) (Signed)
 Cardiology Office Note:   Date:  05/16/2024  ID:  Daniel Chung, DOB [birth year], MRN [id number] PCP: Tobie Suzzane POUR, MD  [facility] Providers Cardiologist:  Georganna Archer, MD { Chief Complaint:  Chief Complaint  Patient presents with   Pre-op Exam      History of Present Illness:   Kordae Buonocore is a 68 y.o. male with a PMH of HTN, HLD, DM2, PAD s/p L fem endarterectomy (2011), L groin pseudoaneurysm, prediabetes, AAA, prior tobacco use, COPD who presents as a new patient referral by Dr. Caine for cardiovascular preoperative risk assessment.  Patient is followed by vascular surgery for PAD and a recently discovered left groin pseudoaneurysm.  Vascular surgery plans to do corrective repair and is requesting cardiovascular risk assessment.  The patient states that overall he feels well, but does endorse having some SOB with exertion.  He reports that if he walks up an incline or does certain physical activities around his yard he will feel more SOB.  He can climb multiple flights of steps without limitation and can do most activities without limitation, but he tries not to push himself too hard.  He denies chest pain, SOB at rest, PND, orthopnea, swelling, palpitations, syncope, and presyncope. The patient underwent CT imaging for lung cancer screening in April of this year.  I reviewed the images myself and they demonstrate significant 3V CAC, aortic calcifications and prominent MAC.  The patient quit smoking in 2017 but smoked from the age of 16 until then smoking 1.5 PPD.  He drinks EtOH infrequently.  Endorses THC use.  He has no family history of cardiovascular disease.  The patient states that he noted a lump in his left groin roughly 1 year ago and was ultimately determined that he has a pseudoaneurysm in his left groin.  Currently awaiting surgery by vascular.   Past Medical History:  Diagnosis Date   Anginal pain    Arthritis    Emphysema lung (HCC)    GERD  (gastroesophageal reflux disease)    Hyperlipidemia    Hypertension    Peripheral neuropathy    Tinnitus      Studies Reviewed:    EKG:  EKG Interpretation Date/Time:  Thursday May 17 2024 13:45:17 EDT Ventricular Rate:  75 PR Interval:  168 QRS Duration:  84 QT Interval:  334 QTC Calculation: 372 R Axis:   36  Text Interpretation: Normal sinus rhythm Inferior infarct , age undetermined When compared with ECG of 21-Jan-2022 10:20, Inferior infarct is now Present Nonspecific T wave abnormality now evident in Inferior leads Confirmed by Archer Georganna [phone]) on 05/17/2024 1:56:03 PM         Risk Assessment/Calculations:              Physical Exam:     VS:  BP (!) 147/80   Pulse 83   Ht 5' 11 (1.803 m)   Wt 187 lb 3.2 oz (84.9 kg)   SpO2 95%   BMI 26.11 kg/m      Wt Readings from Last 3 Encounters:  05/16/24 187 lb (84.8 kg)  04/18/24 185 lb (83.9 kg)  03/21/24 185 lb (83.9 kg)     GEN: Well nourished, well developed, in no acute distress, very pleasant NECK: No JVD; prominent left carotid bruit CARDIAC: RRR, no murmurs, rubs, gallops RESPIRATORY:  Clear to auscultation without rales, wheezing or rhonchi  ABDOMEN: Soft, non-tender, non-distended, normal bowel sounds EXTREMITIES:  Warm and well perfused, no edema; No deformity, 2+ radial  pulses PSYCH: Normal mood and affect   Assessment & Plan Preoperative cardiovascular examination Patient presenting for preoperative cardiovascular risk assessment.  His RCRI = 2 (high risk surgery, prior ischemic disease) which puts him in a 6.6% chance of having an adverse cardiovascular outcome during his procedure. The patient can perform > 4 METS of physical activity.  The patient has many newly discovered cardiovascular issues however, that warrant additional evaluation as described below.  I do not feel strongly that the patient needs to undergo these tests prior to his vascular surgery, but if his vascular surgery  is not emergent then it is also reasonable to wait for him to undergo his cardiovascular assessment as detailed below prior to surgery.  Coronary artery disease involving native coronary artery of native heart without angina pectoris Patient was found to have extensive coronary artery calcifications on a CT scan for his lungs.  He also had dense calcifications in his aorta and his carotid.  The patient is largely asymptomatic; however, his ECG today demonstrates inferior Q waves which can be seen in a prior inferior MI.  The patient shared with me that a few years ago he had an episode of severe chest pain that he did not seek medical treatment for.  Given his ECG, his dense calcifications in his arteries, and his prior episode of chest pain a few years ago, I think is important that we rule out ongoing ischemia.  Given how extensive his calcifications are I will pursue an NM nuclear SPECT.  I will do a pharmacologic test given that he has a left groin pseudoaneurysm and I did not want him to run on a treadmill with this. -Pharmacologic NM stress -Continue indefinite aspirin  81 mg daily - Follow-up in 3 months  Essential hypertension The patient's blood pressure is not at goal despite adherence to his blood pressure regiment.  I counseled him that it is vitally important that his blood pressure be well-controlled particular given his abdominal aortic aneurysm.  The patient feels that his blood pressure may be better controlled at home than it is here.  I instructed the patient to keep a blood pressure log for the next 2 weeks, write down his blood pressure findings, and then share his readings with me.  If his blood pressure is not less than 130/80 then he will need to start an additional antihypertensive.  The patient is agreeable with this plan. -Continue lisinopril  40 mg daily -Continue amlodipine  10 mg daily -Follow-up blood pressure log readings  Abnormal electrocardiogram (ECG) (EKG) Patient  noted to have inferior Q waves on his ECG which are new from his prior ECG a few years ago.  Given his risk factors for CAD and his extensive CAC, I will pursue stress testing to make sure that he does not have ongoing ischemia. -Stress testing as described above  Dyspnea on exertion The patient notes that he has some dyspnea with exertion.  Given his risk factors I will perform a complete echo to evaluate. -Complete echocardiogram  Left carotid bruit He has an audible left carotid bruit as well as calcification of his carotid artery on his CT scan.  I will order bilateral carotid ultrasound. -Bilateral carotid ultrasound  Mixed hyperlipidemia His last LDL was 69 which is not bad, but with how extensive his vascular disease is he would benefit from having an LDL less than 55.  I offered to start Zetia on the patient, but he thinks his diet is poor and that with dietary  changes he may see an improvement of his LDL.  We will repeat a lipid panel in 3 months while he attempts to change his diet and see if he is at goal then. -Offered the patient study but he declined - Repeat lipid panel in 3 months after dietary changes      Informed Consent   Shared Decision Making/Informed Consent The risks [chest pain, shortness of breath, cardiac arrhythmias, dizziness, blood pressure fluctuations, myocardial infarction, stroke/transient ischemic attack, nausea, vomiting, allergic reaction, radiation exposure, metallic taste sensation and life-threatening complications (estimated to be 1 in 10,000)], benefits (risk stratification, diagnosing coronary artery disease, treatment guidance) and alternatives of a nuclear stress test were discussed in detail with Mr. Roanhorse and he agrees to proceed.      This note was written with the assistance of a dictation microphone or AI dictation software. Please excuse any typos or grammatical errors.   Signed, Georganna Archer, MD 05/16/2024 1:49 PM    [facility]

## 2024-05-16 NOTE — Progress Notes (Incomplete)
  Cardiology Office Note:   Date:  05/16/2024  ID:  Daniel Chung, DOB 1955-08-11, MRN [id number] PCP: Tobie Suzzane POUR, MD  [facility] Providers Cardiologist:  Georganna Archer, MD { Chief Complaint: No chief complaint on file.     History of Present Illness:   Daniel Chung is a 68 y.o. male with a PMH of HTN, HLD, DM2, PAD s/p L fem endarterectomy (2011), L groin pseudoaneurysm, AAA, prior tobacco use, COPD who presents as a new patient referral by Dr. Caine for cardiovascular preoperative risk assessment.  Patient is followed by vascular surgery for PAD and a recently discovered left groin pseudoaneurysm.  Vascular surgery plans to do corrective repair and is requesting cardiovascular risk assessment.  The patient underwent CT imaging for lung cancer screening in April of this year.  I reviewed the images myself and they demonstrate significant 3V CAC, aortic calcifications and prominent MAC.   Past Medical History:  Diagnosis Date   Anginal pain    Arthritis    Emphysema lung (HCC)    GERD (gastroesophageal reflux disease)    Hyperlipidemia    Hypertension    Peripheral neuropathy    Tinnitus      Studies Reviewed:    EKG: ***           Risk Assessment/Calculations:   {Does this patient have ATRIAL FIBRILLATION?:[phone]}          Physical Exam:     VS:  There were no vitals taken for this visit. ***    Wt Readings from Last 3 Encounters:  05/16/24 187 lb (84.8 kg)  04/18/24 185 lb (83.9 kg)  03/21/24 185 lb (83.9 kg)     GEN: Well nourished, well developed, in no acute distress NECK: No JVD; No carotid bruits CARDIAC: ***RRR, no murmurs, rubs, gallops RESPIRATORY:  Clear to auscultation without rales, wheezing or rhonchi  ABDOMEN: Soft, non-tender, non-distended, normal bowel sounds EXTREMITIES:  Warm and well perfused, no edema; No deformity, 2+ radial pulses PSYCH: Normal mood and affect   Assessment & Plan   -Add zetia -3 month  lipid -Lpa    {Are you ordering a CV Procedure (e.g. stress test, cath, DCCV, TEE, etc)?   Press F2        :[id number]}   This note was written with the assistance of a dictation microphone or AI dictation software. Please excuse any typos or grammatical errors.   Signed, Georganna Archer, MD 05/16/2024 1:49 PM    [facility]

## 2024-05-16 NOTE — Telephone Encounter (Signed)
   Pre-operative Risk Assessment    Patient Name: Daniel Chung  DOB: [birth year] MRN: [id number]   Date of last office visit: NONE Date of next office visit: 05/17/24 GEORGANNA ARCHER, MD (NP APPT)   Request for Surgical Clearance    Procedure:  LEFT COMMON FEMORAL PSEUDOANEURYSM REPAIR  Date of Surgery:  Clearance TBD   (PER REQUEST ASAP)                             Surgeon:  DR SHEREE Surgeon's Group or Practice Name:  [facility] Phone number:  [phone] Fax number:  [phone]   Type of Clearance Requested:   - Medical  - Pharmacy:  Hold Aspirin      Type of Anesthesia:  General    Additional requests/questions:    SignedLucie DELENA Ku   05/16/2024, 10:42 AM

## 2024-05-17 ENCOUNTER — Ambulatory Visit
Attending: Student in an Organized Health Care Education/Training Program | Admitting: Student in an Organized Health Care Education/Training Program

## 2024-05-17 ENCOUNTER — Other Ambulatory Visit: Payer: Self-pay

## 2024-05-17 ENCOUNTER — Encounter: Payer: Self-pay | Admitting: Student in an Organized Health Care Education/Training Program

## 2024-05-17 VITALS — BP 147/80 | HR 83 | Ht 71.0 in | Wt 187.2 lb

## 2024-05-17 DIAGNOSIS — I2510 Atherosclerotic heart disease of native coronary artery without angina pectoris: Secondary | ICD-10-CM | POA: Diagnosis not present

## 2024-05-17 DIAGNOSIS — I70219 Atherosclerosis of native arteries of extremities with intermittent claudication, unspecified extremity: Secondary | ICD-10-CM

## 2024-05-17 DIAGNOSIS — Z01810 Encounter for preprocedural cardiovascular examination: Secondary | ICD-10-CM | POA: Diagnosis not present

## 2024-05-17 DIAGNOSIS — E782 Mixed hyperlipidemia: Secondary | ICD-10-CM | POA: Insufficient documentation

## 2024-05-17 DIAGNOSIS — I10 Essential (primary) hypertension: Secondary | ICD-10-CM | POA: Insufficient documentation

## 2024-05-17 DIAGNOSIS — R0609 Other forms of dyspnea: Secondary | ICD-10-CM | POA: Insufficient documentation

## 2024-05-17 DIAGNOSIS — R9431 Abnormal electrocardiogram [ECG] [EKG]: Secondary | ICD-10-CM | POA: Diagnosis present

## 2024-05-17 DIAGNOSIS — R0989 Other specified symptoms and signs involving the circulatory and respiratory systems: Secondary | ICD-10-CM | POA: Insufficient documentation

## 2024-05-17 DIAGNOSIS — I700 Atherosclerosis of aorta: Secondary | ICD-10-CM | POA: Diagnosis present

## 2024-05-17 NOTE — Assessment & Plan Note (Signed)
 Patient was found to have extensive coronary artery calcifications on a CT scan for his lungs.  He also had dense calcifications in his aorta and his carotid.  The patient is largely asymptomatic; however, his ECG today demonstrates inferior Q waves which can be seen in a prior inferior MI.  The patient shared with me that a few years ago he had an episode of severe chest pain that he did not seek medical treatment for.  Given his ECG, his dense calcifications in his arteries, and his prior episode of chest pain a few years ago, I think is important that we rule out ongoing ischemia.  Given how extensive his calcifications are I will pursue an NM nuclear SPECT.  I will do a pharmacologic test given that he has a left groin pseudoaneurysm and I did not want him to run on a treadmill with this. -Pharmacologic NM stress -Continue indefinite aspirin  81 mg daily - Follow-up in 3 months

## 2024-05-17 NOTE — Assessment & Plan Note (Signed)
 His last LDL was 69 which is not bad, but with how extensive his vascular disease is he would benefit from having an LDL less than 55.  I offered to start Zetia on the patient, but he thinks his diet is poor and that with dietary changes he may see an improvement of his LDL.  We will repeat a lipid panel in 3 months while he attempts to change his diet and see if he is at goal then. -Offered the patient study but he declined - Repeat lipid panel in 3 months after dietary changes

## 2024-05-17 NOTE — Assessment & Plan Note (Signed)
 The patient's blood pressure is not at goal despite adherence to his blood pressure regiment.  I counseled him that it is vitally important that his blood pressure be well-controlled particular given his abdominal aortic aneurysm.  The patient feels that his blood pressure may be better controlled at home than it is here.  I instructed the patient to keep a blood pressure log for the next 2 weeks, write down his blood pressure findings, and then share his readings with me.  If his blood pressure is not less than 130/80 then he will need to start an additional antihypertensive.  The patient is agreeable with this plan. -Continue lisinopril  40 mg daily -Continue amlodipine  10 mg daily -Follow-up blood pressure log readings

## 2024-05-17 NOTE — Assessment & Plan Note (Signed)
 He has an audible left carotid bruit as well as calcification of his carotid artery on his CT scan.  I will order bilateral carotid ultrasound. -Bilateral carotid ultrasound

## 2024-05-17 NOTE — Patient Instructions (Signed)
 Medication Instructions:  Your physician recommends that you continue on your current medications as directed. Please refer to the Current Medication list given to you today.  *If you need a refill on your cardiac medications before your next appointment, please call your pharmacy*  Labs IN THREE MONTHS: Fasting Lipids  Testing/Procedures: Carotid Ultrasound Your physician has requested that you have a carotid duplex. This test is an ultrasound of the carotid arteries in your neck. It looks at blood flow through these arteries that supply the brain with blood. Allow one hour for this exam. There are no restrictions or special instructions.   Stress Test Your physician has requested that you have a lexiscan myoview. For further information please visit [email or web address] Please follow instruction sheet, as given.  Echocardiogram  Your physician has requested that you have an echocardiogram. Echocardiography is a painless test that uses sound waves to create images of your heart. It provides your doctor with information about the size and shape of your heart and how well your heart's chambers and valves are working. This procedure takes approximately one hour. There are no restrictions for this procedure. Please do NOT wear cologne, perfume, aftershave, or lotions (deodorant is allowed). Please arrive 15 minutes prior to your appointment time.  Please note: We ask at that you not bring children with you during ultrasound (echo/ vascular) testing. Due to room size and safety concerns, children are not allowed in the ultrasound rooms during exams. Our front office staff cannot provide observation of children in our lobby area while testing is being conducted. An adult accompanying a patient to their appointment will only be allowed in the ultrasound room at the discretion of the ultrasound technician under special circumstances. We apologize for any inconvenience.   Follow-Up: At [facility], you and your health needs are our priority.  As part of our continuing mission to provide you with exceptional heart care, our providers are all part of one team.  This team includes your primary Cardiologist (physician) and Advanced Practice Providers or APPs (Physician Assistants and Nurse Practitioners) who all work together to provide you with the care you need, when you need it.  Your next appointment: 3 months  Provider:   Georganna Archer, MD

## 2024-05-18 ENCOUNTER — Encounter (HOSPITAL_COMMUNITY): Payer: Self-pay | Admitting: *Deleted

## 2024-05-23 ENCOUNTER — Other Ambulatory Visit: Payer: Self-pay | Admitting: Student in an Organized Health Care Education/Training Program

## 2024-05-23 DIAGNOSIS — I70219 Atherosclerosis of native arteries of extremities with intermittent claudication, unspecified extremity: Secondary | ICD-10-CM

## 2024-05-23 DIAGNOSIS — R9431 Abnormal electrocardiogram [ECG] [EKG]: Secondary | ICD-10-CM

## 2024-05-23 DIAGNOSIS — I10 Essential (primary) hypertension: Secondary | ICD-10-CM

## 2024-05-23 DIAGNOSIS — I700 Atherosclerosis of aorta: Secondary | ICD-10-CM

## 2024-05-28 ENCOUNTER — Telehealth: Payer: Self-pay

## 2024-05-28 ENCOUNTER — Other Ambulatory Visit: Payer: Self-pay | Admitting: Student in an Organized Health Care Education/Training Program

## 2024-05-28 ENCOUNTER — Ambulatory Visit (HOSPITAL_BASED_OUTPATIENT_CLINIC_OR_DEPARTMENT_OTHER)
Admission: RE | Admit: 2024-05-28 | Discharge: 2024-05-28 | Disposition: A | Discharge status: Home / Self Care | Source: Ambulatory Visit | Attending: Cardiology | Admitting: Cardiology

## 2024-05-28 ENCOUNTER — Ambulatory Visit (HOSPITAL_COMMUNITY)
Admission: RE | Admit: 2024-05-28 | Discharge: 2024-05-28 | Disposition: A | Discharge status: Home / Self Care | Source: Ambulatory Visit | Attending: Student in an Organized Health Care Education/Training Program | Admitting: Student in an Organized Health Care Education/Training Program

## 2024-05-28 DIAGNOSIS — I70219 Atherosclerosis of native arteries of extremities with intermittent claudication, unspecified extremity: Secondary | ICD-10-CM

## 2024-05-28 DIAGNOSIS — R0609 Other forms of dyspnea: Secondary | ICD-10-CM

## 2024-05-28 DIAGNOSIS — R9431 Abnormal electrocardiogram [ECG] [EKG]: Secondary | ICD-10-CM

## 2024-05-28 DIAGNOSIS — I10 Essential (primary) hypertension: Secondary | ICD-10-CM

## 2024-05-28 DIAGNOSIS — I700 Atherosclerosis of aorta: Secondary | ICD-10-CM | POA: Diagnosis not present

## 2024-05-28 DIAGNOSIS — E782 Mixed hyperlipidemia: Secondary | ICD-10-CM

## 2024-05-28 DIAGNOSIS — Z01810 Encounter for preprocedural cardiovascular examination: Secondary | ICD-10-CM

## 2024-05-28 DIAGNOSIS — R0989 Other specified symptoms and signs involving the circulatory and respiratory systems: Secondary | ICD-10-CM

## 2024-05-28 DIAGNOSIS — I2510 Atherosclerotic heart disease of native coronary artery without angina pectoris: Secondary | ICD-10-CM

## 2024-05-28 MED ORDER — TECHNETIUM TC 99M TETROFOSMIN IV KIT
10.8000 | PACK | Freq: Once | INTRAVENOUS | Status: AC | PRN
Start: 2024-05-28 — End: 2024-05-28
  Administered 2024-05-28: 10.8 via INTRAVENOUS

## 2024-05-28 MED ORDER — TECHNETIUM TC 99M TETROFOSMIN IV KIT
32.9000 | PACK | Freq: Once | INTRAVENOUS | Status: AC | PRN
Start: 2024-05-28 — End: 2024-05-28
  Administered 2024-05-28: 32.9 via INTRAVENOUS

## 2024-05-28 MED ORDER — REGADENOSON 0.4 MG/5ML IV SOLN
INTRAVENOUS | Status: AC
  Filled 2024-05-28: qty 5

## 2024-05-28 MED ORDER — REGADENOSON 0.4 MG/5ML IV SOLN
0.4000 mg | Freq: Once | INTRAVENOUS | Status: AC
  Administered 2024-05-28: 0.4 mg via INTRAVENOUS

## 2024-05-28 NOTE — Telephone Encounter (Signed)
 Paper Work Dropped Off:   5th floor H & V   Date:  05-28-24  Location of paper:  Patient dropped off BP readings for Dr Floretta  05-28-24 VB

## 2024-05-29 LAB — MYOCARDIAL PERFUSION IMAGING
LV dias vol: 78 mL (ref 62–150)
LV sys vol: 21 mL (ref 4.2–5.8)
Nuc Stress EF: 73 %
Peak HR: 116 {beats}/min
Rest HR: 83 {beats}/min
Rest Nuclear Isotope Dose: 10.8 mCi
SDS: 0
SRS: 9
SSS: 9
ST Depression (mm): 0 mm
Stress Nuclear Isotope Dose: 32.9 mCi
TID: 1.3

## 2024-05-31 ENCOUNTER — Telehealth: Payer: Self-pay | Admitting: Student in an Organized Health Care Education/Training Program

## 2024-05-31 ENCOUNTER — Ambulatory Visit (HOSPITAL_COMMUNITY)
Admission: RE | Admit: 2024-05-31 | Discharge: 2024-05-31 | Disposition: A | Discharge status: Home / Self Care | Source: Ambulatory Visit | Attending: Student in an Organized Health Care Education/Training Program | Admitting: Student in an Organized Health Care Education/Training Program

## 2024-05-31 ENCOUNTER — Ambulatory Visit: Payer: Self-pay | Admitting: Student in an Organized Health Care Education/Training Program

## 2024-05-31 DIAGNOSIS — I70219 Atherosclerosis of native arteries of extremities with intermittent claudication, unspecified extremity: Secondary | ICD-10-CM | POA: Diagnosis not present

## 2024-05-31 DIAGNOSIS — I10 Essential (primary) hypertension: Secondary | ICD-10-CM | POA: Insufficient documentation

## 2024-05-31 DIAGNOSIS — I700 Atherosclerosis of aorta: Secondary | ICD-10-CM | POA: Diagnosis not present

## 2024-05-31 DIAGNOSIS — R0602 Shortness of breath: Secondary | ICD-10-CM | POA: Insufficient documentation

## 2024-05-31 LAB — ECHOCARDIOGRAM COMPLETE
Area-P 1/2: 3.74 cm2
S' Lateral: 3.1 cm

## 2024-05-31 NOTE — Telephone Encounter (Signed)
 I received the patient's 2-week blood pressure log and his blood pressures are perfectly controlled at home.  His highest SBP was 132 and his lowest was 79.  His average BPs were all <130/80.  No changes.  Best Regards,  Joaopedro Eschbach T. Floretta HEATH, MD [place]  [facility]  05/31/2024 6:25 PM

## 2024-05-31 NOTE — Telephone Encounter (Signed)
 I called the patient to discuss his stress test and vascular ultrasound results.  His vascular ultrasound demonstrated nonobstructive CAS bilaterally.  His stress test demonstrated evidence of a prior inferior infarct.  This was also noted on his ECG.  Given that the patient likely has had a prior MI, I think proceeding with an LHC is indicated.  I discussed the risk and benefits of a left heart catheterization with the patient and he is agreeable to proceeding with catheterization.  I will also notify his surgeon of this change and plan.  Best Regards,  Ananda Sitzer T. Floretta HEATH, MD [place]  [facility]  05/31/2024 6:16 PM    Informed Consent   Shared Decision Making/Informed Consent The risks [stroke (1 in 1000), death (1 in 1000), kidney failure [usually temporary] (1 in 500), bleeding (1 in 200), allergic reaction [possibly serious] (1 in 200)], benefits (diagnostic support and management of coronary artery disease) and alternatives of a cardiac catheterization were discussed in detail with Mr. Campusano and he is willing to proceed.

## 2024-06-01 ENCOUNTER — Telehealth: Payer: Self-pay | Admitting: Student in an Organized Health Care Education/Training Program

## 2024-06-01 ENCOUNTER — Other Ambulatory Visit: Payer: Self-pay

## 2024-06-01 DIAGNOSIS — Z01812 Encounter for preprocedural laboratory examination: Secondary | ICD-10-CM

## 2024-06-01 DIAGNOSIS — I2510 Atherosclerotic heart disease of native coronary artery without angina pectoris: Secondary | ICD-10-CM

## 2024-06-01 NOTE — Telephone Encounter (Signed)
 Pt would like to go to the lab at [facility] for orders. Pt would like a c/b to make sure the correct orders are in and he can be seen at this lab. Please advise.

## 2024-06-01 NOTE — Telephone Encounter (Signed)
 S/w the patient and informed that he can go to any [facility] to get labs drawn. They have been released and they should be able to see the orders.  Informed him that there is a [facility] across the the street from the hospital and on [address]. He verbalized understanding.

## 2024-06-02 LAB — CBC
Hematocrit: 45.1 % (ref 37.5–51.0)
Hemoglobin: 15.3 g/dL (ref 13.0–17.7)
MCH: 32.2 pg (ref 26.6–33.0)
MCHC: 33.9 g/dL (ref 31.5–35.7)
MCV: 95 fL (ref 79–97)
Platelets: 219 x10E3/uL (ref 150–450)
RBC: 4.75 x10E6/uL (ref 4.14–5.80)
RDW: 12.5 % (ref 11.6–15.4)
WBC: 11.2 x10E3/uL — ABNORMAL HIGH (ref 3.4–10.8)

## 2024-06-02 LAB — BASIC METABOLIC PANEL WITH GFR
BUN/Creatinine Ratio: 16 (ref 10–24)
BUN: 18 mg/dL (ref 8–27)
CO2: 23 mmol/L (ref 20–29)
Calcium: 9.7 mg/dL (ref 8.6–10.2)
Chloride: 99 mmol/L (ref 96–106)
Creatinine, Ser: 1.1 mg/dL (ref 0.76–1.27)
Glucose: 149 mg/dL — AB (ref 70–99)
Potassium: 4.9 mmol/L (ref 3.5–5.2)
Sodium: 136 mmol/L (ref 134–144)
eGFR: 73 mL/min/1.73 (ref >59)

## 2024-06-05 NOTE — Telephone Encounter (Signed)
 Spoke with pt to see if cath instructions had been received in the mail yet, pt stated they had not. Went over instructions thoroughly with pt over the phone. Labs were completed on 10/31. Pt denied any further questions or concerns at this time.

## 2024-06-06 ENCOUNTER — Telehealth: Payer: Self-pay | Admitting: *Deleted

## 2024-06-06 NOTE — Telephone Encounter (Signed)
 Cardiac Catheterization scheduled at [facility] for: Friday June 08, 2024 12 Noon Arrival time [facility] Main Entrance A at: 10 AM  Diet: -May have light meal until 6 AM. (6 hours before procedure time) Approved light meal consists of plain toast, fruit, light soups, crackers.  Hydration: -May drink clear liquids until 2 hours before the procedure.  Approved liquids: Water, clear tea, black coffee, fruit juices-non-citric and without pulp,Gatorade, plain Jello/popsicles.   -Please drink 16 oz of water 2 hours before procedure.  Medication instructions: -Usual morning medications can be taken including aspirin  81 mg.  Plan to go home the same day, you will only stay overnight if medically necessary.  You must have responsible adult to drive you home.  Someone must be with you the first 24 hours after you arrive home.  Call placed to patient to review procedure instructions, no answer, unable to leave message,  no voicemail box.

## 2024-06-08 ENCOUNTER — Other Ambulatory Visit: Payer: Self-pay

## 2024-06-08 ENCOUNTER — Observation Stay (HOSPITAL_COMMUNITY)
Admission: RE | Admit: 2024-06-08 | Discharge: 2024-06-09 | Disposition: A | Discharge status: Home / Self Care | Attending: Cardiovascular Disease | Admitting: Cardiovascular Disease

## 2024-06-08 ENCOUNTER — Encounter (HOSPITAL_COMMUNITY): Payer: Self-pay | Admitting: Cardiovascular Disease

## 2024-06-08 ENCOUNTER — Encounter (HOSPITAL_COMMUNITY)
Admission: RE | Disposition: A | Discharge status: Home / Self Care | Payer: Self-pay | Source: Home / Self Care | Attending: Cardiovascular Disease

## 2024-06-08 ENCOUNTER — Other Ambulatory Visit (HOSPITAL_COMMUNITY): Payer: Self-pay

## 2024-06-08 DIAGNOSIS — Z955 Presence of coronary angioplasty implant and graft: Principal | ICD-10-CM

## 2024-06-08 DIAGNOSIS — D72829 Elevated white blood cell count, unspecified: Secondary | ICD-10-CM | POA: Insufficient documentation

## 2024-06-08 DIAGNOSIS — Z7902 Long term (current) use of antithrombotics/antiplatelets: Secondary | ICD-10-CM | POA: Insufficient documentation

## 2024-06-08 DIAGNOSIS — Z7982 Long term (current) use of aspirin: Secondary | ICD-10-CM | POA: Insufficient documentation

## 2024-06-08 DIAGNOSIS — R9431 Abnormal electrocardiogram [ECG] [EKG]: Secondary | ICD-10-CM | POA: Insufficient documentation

## 2024-06-08 DIAGNOSIS — E119 Type 2 diabetes mellitus without complications: Secondary | ICD-10-CM | POA: Insufficient documentation

## 2024-06-08 DIAGNOSIS — Z79899 Other long term (current) drug therapy: Secondary | ICD-10-CM | POA: Diagnosis not present

## 2024-06-08 DIAGNOSIS — R0602 Shortness of breath: Secondary | ICD-10-CM | POA: Diagnosis present

## 2024-06-08 DIAGNOSIS — I2510 Atherosclerotic heart disease of native coronary artery without angina pectoris: Principal | ICD-10-CM | POA: Diagnosis present

## 2024-06-08 DIAGNOSIS — I25110 Atherosclerotic heart disease of native coronary artery with unstable angina pectoris: Secondary | ICD-10-CM | POA: Diagnosis not present

## 2024-06-08 DIAGNOSIS — I10 Essential (primary) hypertension: Secondary | ICD-10-CM | POA: Insufficient documentation

## 2024-06-08 DIAGNOSIS — I714 Abdominal aortic aneurysm, without rupture, unspecified: Secondary | ICD-10-CM | POA: Diagnosis not present

## 2024-06-08 DIAGNOSIS — I739 Peripheral vascular disease, unspecified: Secondary | ICD-10-CM | POA: Insufficient documentation

## 2024-06-08 DIAGNOSIS — E782 Mixed hyperlipidemia: Secondary | ICD-10-CM | POA: Insufficient documentation

## 2024-06-08 HISTORY — PX: CORONARY PRESSURE/FFR WITH 3D MAPPING: CATH118309

## 2024-06-08 HISTORY — PX: LEFT HEART CATH AND CORONARY ANGIOGRAPHY: CATH118249

## 2024-06-08 HISTORY — PX: CORONARY STENT INTERVENTION: CATH118234

## 2024-06-08 LAB — POCT ACTIVATED CLOTTING TIME
Activated Clotting Time: 314 s
Activated Clotting Time: 262 s
Activated Clotting Time: 314 s

## 2024-06-08 SURGERY — LEFT HEART CATH AND CORONARY ANGIOGRAPHY
Anesthesia: LOCAL

## 2024-06-08 MED ORDER — HEPARIN (PORCINE) IN NACL 1000-0.9 UT/500ML-% IV SOLN
INTRAVENOUS | Status: DC | PRN
  Administered 2024-06-08: 1000 mL via SURGICAL_CAVITY

## 2024-06-08 MED ORDER — LIDOCAINE HCL (PF) 1 % IJ SOLN
INTRAMUSCULAR | Status: AC
  Filled 2024-06-08: qty 30

## 2024-06-08 MED ORDER — NITROGLYCERIN 1 MG/10 ML FOR IR/CATH LAB
INTRA_ARTERIAL | Status: DC | PRN
  Administered 2024-06-08 (×2): 200 ug via INTRACORONARY

## 2024-06-08 MED ORDER — SODIUM CHLORIDE 0.9 % IV SOLN
INTRAVENOUS | Status: AC | PRN
  Administered 2024-06-08: 500 mL via INTRAVENOUS

## 2024-06-08 MED ORDER — ASPIRIN 81 MG PO CHEW: 81.0000 mg | CHEWABLE_TABLET | ORAL | Status: DC

## 2024-06-08 MED ORDER — LIDOCAINE HCL (PF) 1 % IJ SOLN
INTRAMUSCULAR | Status: DC | PRN
  Administered 2024-06-08: 5 mL via INTRADERMAL

## 2024-06-08 MED ORDER — VERAPAMIL HCL 2.5 MG/ML IV SOLN
INTRAVENOUS | Status: DC | PRN
  Administered 2024-06-08: 10 mL via INTRA_ARTERIAL

## 2024-06-08 MED ORDER — FAMOTIDINE IN NACL 20-0.9 MG/50ML-% IV SOLN
INTRAVENOUS | Status: AC | PRN
  Administered 2024-06-08: 20 mg via INTRAVENOUS

## 2024-06-08 MED ORDER — HYDRALAZINE HCL 20 MG/ML IJ SOLN: 10.0000 mg | INTRAMUSCULAR | Status: AC | PRN

## 2024-06-08 MED ORDER — CLOPIDOGREL BISULFATE 75 MG PO TABS
75.0000 mg | ORAL_TABLET | Freq: Every day | ORAL | 5 refills | Status: AC
  Filled 2024-06-08: qty 30, 30d supply, fill #0

## 2024-06-08 MED ORDER — ASPIRIN 81 MG PO TBEC
81.0000 mg | DELAYED_RELEASE_TABLET | Freq: Every day | ORAL | Status: DC
  Administered 2024-06-09: 81 mg via ORAL
  Filled 2024-06-08: qty 1

## 2024-06-08 MED ORDER — VERAPAMIL HCL 2.5 MG/ML IV SOLN
INTRAVENOUS | Status: AC
  Filled 2024-06-08: qty 2

## 2024-06-08 MED ORDER — CLOPIDOGREL BISULFATE 75 MG PO TABS
75.0000 mg | ORAL_TABLET | Freq: Every day | ORAL | Status: DC
  Administered 2024-06-09: 75 mg via ORAL
  Filled 2024-06-08: qty 1

## 2024-06-08 MED ORDER — FENTANYL CITRATE (PF) 100 MCG/2ML IJ SOLN
INTRAMUSCULAR | Status: AC
  Filled 2024-06-08: qty 2

## 2024-06-08 MED ORDER — FAMOTIDINE IN NACL 20-0.9 MG/50ML-% IV SOLN
INTRAVENOUS | Status: AC
  Filled 2024-06-08: qty 50

## 2024-06-08 MED ORDER — CLOPIDOGREL BISULFATE 300 MG PO TABS
ORAL_TABLET | ORAL | Status: DC | PRN
  Administered 2024-06-08: 600 mg via ORAL

## 2024-06-08 MED ORDER — SODIUM CHLORIDE 0.9% FLUSH
3.0000 mL | Freq: Two times a day (BID) | INTRAVENOUS | Status: DC
  Administered 2024-06-08 – 2024-06-09 (×2): 3 mL via INTRAVENOUS

## 2024-06-08 MED ORDER — SODIUM CHLORIDE 0.9% FLUSH: 3.0000 mL | Freq: Two times a day (BID) | INTRAVENOUS | Status: DC

## 2024-06-08 MED ORDER — SODIUM CHLORIDE 0.9% FLUSH: 3.0000 mL | INTRAVENOUS | Status: DC | PRN

## 2024-06-08 MED ORDER — IOHEXOL 350 MG/ML SOLN
INTRAVENOUS | Status: DC | PRN
  Administered 2024-06-08: 270 mL via INTRA_ARTERIAL

## 2024-06-08 MED ORDER — MIDAZOLAM HCL 2 MG/2ML IJ SOLN
INTRAMUSCULAR | Status: AC
  Filled 2024-06-08: qty 2

## 2024-06-08 MED ORDER — FENTANYL CITRATE (PF) 100 MCG/2ML IJ SOLN
INTRAMUSCULAR | Status: DC | PRN
  Administered 2024-06-08: 50 ug via INTRAVENOUS
  Administered 2024-06-08: 25 ug via INTRAVENOUS

## 2024-06-08 MED ORDER — LABETALOL HCL 5 MG/ML IV SOLN: 10.0000 mg | INTRAVENOUS | Status: AC | PRN

## 2024-06-08 MED ORDER — HEPARIN SODIUM (PORCINE) 1000 UNIT/ML IJ SOLN
INTRAMUSCULAR | Status: DC | PRN
  Administered 2024-06-08: 6000 [IU] via INTRAVENOUS
  Administered 2024-06-08 (×2): 4000 [IU] via INTRAVENOUS

## 2024-06-08 MED ORDER — FREE WATER: 500.0000 mL | Freq: Once | Status: DC

## 2024-06-08 MED ORDER — FAMOTIDINE 20 MG PO TABS
20.0000 mg | ORAL_TABLET | Freq: Every day | ORAL | Status: DC
  Administered 2024-06-09: 20 mg via ORAL
  Filled 2024-06-08: qty 1

## 2024-06-08 MED ORDER — CLOPIDOGREL BISULFATE 300 MG PO TABS
ORAL_TABLET | ORAL | Status: AC
  Filled 2024-06-08: qty 2

## 2024-06-08 MED ORDER — AMLODIPINE BESYLATE 10 MG PO TABS
10.0000 mg | ORAL_TABLET | Freq: Every day | ORAL | Status: DC
  Administered 2024-06-09: 10 mg via ORAL
  Filled 2024-06-08: qty 1

## 2024-06-08 MED ORDER — ONDANSETRON HCL 4 MG/2ML IJ SOLN: 4.0000 mg | Freq: Four times a day (QID) | INTRAMUSCULAR | Status: DC | PRN

## 2024-06-08 MED ORDER — NITROGLYCERIN 1 MG/10 ML FOR IR/CATH LAB
INTRA_ARTERIAL | Status: AC
  Filled 2024-06-08: qty 10

## 2024-06-08 MED ORDER — MIDAZOLAM HCL (PF) 2 MG/2ML IJ SOLN
INTRAMUSCULAR | Status: DC | PRN
  Administered 2024-06-08: 2 mg via INTRAVENOUS
  Administered 2024-06-08: 1 mg via INTRAVENOUS

## 2024-06-08 MED ORDER — ACETAMINOPHEN 325 MG PO TABS: 650.0000 mg | ORAL_TABLET | ORAL | Status: DC | PRN

## 2024-06-08 MED ORDER — ATORVASTATIN CALCIUM 80 MG PO TABS
80.0000 mg | ORAL_TABLET | Freq: Every day | ORAL | Status: DC
  Administered 2024-06-09: 80 mg via ORAL
  Filled 2024-06-08: qty 1

## 2024-06-08 MED ORDER — HEPARIN SODIUM (PORCINE) 1000 UNIT/ML IJ SOLN
INTRAMUSCULAR | Status: AC
  Filled 2024-06-08: qty 10

## 2024-06-08 MED ORDER — SODIUM CHLORIDE 0.9 % IV SOLN: 250.0000 mL | INTRAVENOUS | Status: DC | PRN

## 2024-06-08 MED ORDER — ENOXAPARIN SODIUM 40 MG/0.4ML IJ SOSY: 40.0000 mg | PREFILLED_SYRINGE | INTRAMUSCULAR | Status: DC

## 2024-06-08 MED ORDER — LISINOPRIL 10 MG PO TABS
40.0000 mg | ORAL_TABLET | Freq: Every day | ORAL | Status: DC
  Administered 2024-06-09: 40 mg via ORAL
  Filled 2024-06-08: qty 4

## 2024-06-08 SURGICAL SUPPLY — 20 items
BALLOON EMERGE MR 2.0X12 (BALLOONS) IMPLANT
BALLOON TAKERU 1.5X12 (BALLOONS) IMPLANT
BALLOON ~~LOC~~ EMERGE MR 2.75X12 (BALLOONS) IMPLANT
BALLOON ~~LOC~~ EMERGE MR 3.25X20 (BALLOONS) IMPLANT
CARD KEY FFR CATHWORX (MISCELLANEOUS) IMPLANT
CATH 5FR JL3.5 JR4 ANG PIG MP (CATHETERS) IMPLANT
CATH LAUNCHER 6FR AL.75 (CATHETERS) IMPLANT
CATH LAUNCHER 6FR EBU 3 (CATHETERS) IMPLANT
DEVICE RAD COMP TR BAND LRG (VASCULAR PRODUCTS) IMPLANT
GLIDESHEATH SLEND SS 6F .021 (SHEATH) IMPLANT
GUIDEWIRE INQWIRE 1.5J.035X260 (WIRE) IMPLANT
KIT ENCORE 26 ADVANTAGE (KITS) IMPLANT
KIT NAMIC PS PRESSURIZED FLUID (KITS) IMPLANT
PACK CARDIAC CATHETERIZATION (CUSTOM PROCEDURE TRAY) ×1 IMPLANT
SET ATX-X65L (MISCELLANEOUS) IMPLANT
STENT SYNERGY XD 2.50X12 (Permanent Stent) IMPLANT
STENT SYNERGY XD 2.50X16 (Permanent Stent) IMPLANT
STENT SYNERGY XD 2.75X24 (Permanent Stent) IMPLANT
STENT SYNERGY XD 3.0X38 (Permanent Stent) IMPLANT
WIRE RUNTHROUGH .014X180CM (WIRE) IMPLANT

## 2024-06-08 NOTE — Progress Notes (Signed)
 06/08/2024  2105 Received pt to room 6E-06 from sort stay post cardiac cath.  Tele monitor placed and CCMD notified.  Pt without C/O.  Oriented to room, call light and bed.  Call bell in reach. Dasie Lamarr BROCKS

## 2024-06-08 NOTE — Interval H&P Note (Signed)
 History and Physical Interval Note:  06/08/2024 12:14 PM  Daniel Chung  has presented today for surgery, with the diagnosis of cad.  The various methods of treatment have been discussed with the patient and family. After consideration of risks, benefits and other options for treatment, the patient has consented to  Procedure(s): LEFT HEART CATH AND CORONARY ANGIOGRAPHY (N/A) as a surgical intervention.  The patient's history has been reviewed, patient examined, no change in status, stable for surgery.  I have reviewed the patient's chart and labs.  Questions were answered to the patient's satisfaction.    Cath Lab Visit (complete for each Cath Lab visit)  Clinical Evaluation Leading to the Procedure:   ACS: No.  Non-ACS:    Anginal Classification: CCS II  Anti-ischemic medical therapy: Minimal Therapy (1 class of medications)  Non-Invasive Test Results: Intermediate-risk stress test findings: cardiac mortality 1-3%/year  Prior CABG: No previous CABG        Daniel Chung

## 2024-06-08 NOTE — Discharge Instructions (Signed)

## 2024-06-08 NOTE — Progress Notes (Addendum)
   Evaluated patient in short stay.  RN has been attempting to get TR band off, patient has been feeling poorly when pressure is applied to his wrist and TR band is adjusted.  When adjusting TR band or applying pressure, patient starts to feel very hot/sweaty and a bit nauseous.  Patient had been down to 3 cc air and TR band.  Then had to go up to 6 cc due to oozing from cath site.  Had to go up to 7 cc before TR band was removed because it had been on for 4 hours.  Short stay RN currently applying pressure.  I am concerned that even if we are able to get the bleeding to stop, he would be at risk for having recurrent using if he were to go home tonight.  Admit patient to observation. Discussed with Dr. Verlin who agreed   Rollo FABIENE Louder, PA-C 06/08/2024 7:06 PM

## 2024-06-08 NOTE — Discharge Summary (Incomplete)
 Discharge Summary for Same Day PCI   Patient ID: Daniel Chung MRN: [id number]; DOB: [birth year]  Admit date: 06/08/2024 Discharge date: 06/08/2024  Primary Care Provider: Tobie Suzzane POUR, MD  Primary Cardiologist: Georganna Archer, MD  Primary Electrophysiologist:  None   Discharge Diagnoses    Active Problems:   CAD (coronary artery disease), native coronary artery    Diagnostic Studies/Procedures    Cardiac Catheterization 06/08/2024:   Mid Cx to Dist Cx lesion is 99% stenosed.   Mid Cx lesion is 70% stenosed.   Mid RCA lesion is 80% stenosed.   Prox RCA lesion is 80% stenosed.   A drug-eluting stent was successfully placed using a STENT SYNERGY XD 2.50X16.   A drug-eluting stent was successfully placed using a STENT SYNERGY XD [id number].   A drug-eluting stent was successfully placed using a STENT SYNERGY XD 3.0X38.   Post intervention, there is a 0% residual stenosis.   Post intervention, there is a 0% residual stenosis.   Post intervention, there is a 0% residual stenosis.   Post intervention, there is a 0% residual stenosis.   Severe stenosis mid Circumflex Successful PTCA/DES x 2 mid Circumflex Severe stenosis proximal and mid RCA. Virtual FFR 0.71 (Cathworks) Successful PTCA/DES x 1 proximal and mid RCA Normal LVEDP   Recommendations: Will need DAPT with ASA and Plavix for 6 months before it is interrupted. Continue statin. Same day post PCI discharge.   Diagnostic Dominance: Right  Intervention    _____________   History of Present Illness     [name] is a 68 y.o. male with past medical history of hypertension, hyperlipidemia, type 2 diabetes, PAD s/p L. Pham endarterectomy in 2011, L groin pseudoaneurysm, prediabetes, AAA, prior tobacco use, COPD.   Patient had been referred to Dr. Archer for preoperative evaluation. Patient reported having episodes of shortness of breath on exertion. He underwent stress test on 05/28/24 that showed evidence of  inferior infarction.  Echo 05/31/2024 showed EF 60-65%, no regional wall motion abnormalities, normal RV systolic function, no significant valvular abnormalities.  Cardiac catheterization was arranged for further evaluation.  Hospital Course     The patient underwent cardiac cath as noted above with Dr. Verlin. Plan for DAPT with ASA/Plavix for at least 6 months uninterrupted. The patient was seen by cardiac rehab while in short stay. There were no observed complications post cath. Radial*** cath site was re-evaluated prior to discharge and found to be stable without any complications. Instructions/precautions regarding cath site care were given prior to discharge.  Chevelle Durr was seen by Dr. Verlin and determined stable for discharge home. Follow up with our office has been arranged. Medications are listed below. Pertinent changes include Addition of plavix.  Has follow up with gen cards APP 06/22/24    _____________  Cath/PCI Registry Performance & Quality Measures: Aspirin  prescribed? - Yes ADP Receptor Inhibitor (Plavix/Clopidogrel, Brilinta/Ticagrelor or Effient/Prasugrel) prescribed (includes medically managed patients)? - Yes High Intensity Statin (Lipitor 40-80mg  or Crestor 20-40mg ) prescribed? - Yes For EF <40%, was ACEI/ARB prescribed? - Not Applicable (EF >/= 40%) For EF <40%, Aldosterone Antagonist (Spironolactone or Eplerenone) prescribed? - Not Applicable (EF >/= 40%) Cardiac Rehab Phase II ordered (Included Medically managed Patients)? - Yes  _____________   Discharge Vitals Blood pressure (!) 138/99, pulse (!) 103, resp. rate 20, height 5' 11 (1.803 m), weight 83.9 kg, SpO2 94%.  Filed Weights   06/08/24 1022  Weight: 83.9 kg    Last Labs & Radiologic Studies  CBC No results for input(s): WBC, NEUTROABS, HGB, HCT, MCV, PLT in the last 72 hours. Basic Metabolic Panel No results for input(s): NA, K, CL, CO2, GLUCOSE, BUN,  CREATININE, CALCIUM , MG, PHOS in the last 72 hours. Liver Function Tests No results for input(s): AST, ALT, ALKPHOS, BILITOT, PROT, ALBUMIN in the last 72 hours. No results for input(s): LIPASE, AMYLASE in the last 72 hours. High Sensitivity Troponin:   No results for input(s): TROPONINIHS in the last 720 hours.  BNP Invalid input(s): POCBNP D-Dimer No results for input(s): DDIMER in the last 72 hours. Hemoglobin A1C No results for input(s): HGBA1C in the last 72 hours. Fasting Lipid Panel No results for input(s): CHOL, HDL, LDLCALC, TRIG, CHOLHDL, LDLDIRECT in the last 72 hours. Thyroid  Function Tests No results for input(s): TSH, T4TOTAL, T3FREE, THYROIDAB in the last 72 hours.  Invalid input(s): FREET3 _____________  CARDIAC CATHETERIZATION Result Date: 06/08/2024   Mid Cx to Dist Cx lesion is 99% stenosed.   Mid Cx lesion is 70% stenosed.   Mid RCA lesion is 80% stenosed.   Prox RCA lesion is 80% stenosed.   A drug-eluting stent was successfully placed using a STENT SYNERGY XD 2.50X16.   A drug-eluting stent was successfully placed using a STENT SYNERGY XD [id number].   A drug-eluting stent was successfully placed using a STENT SYNERGY XD 3.0X38.   Post intervention, there is a 0% residual stenosis.   Post intervention, there is a 0% residual stenosis.   Post intervention, there is a 0% residual stenosis.   Post intervention, there is a 0% residual stenosis. Severe stenosis mid Circumflex Successful PTCA/DES x 2 mid Circumflex Severe stenosis proximal and mid RCA. Virtual FFR 0.71 (Cathworks) Successful PTCA/DES x 1 proximal and mid RCA Normal LVEDP Recommendations: Will need DAPT with ASA and Plavix for 6 months before it is interrupted. Continue statin. Same day post PCI discharge.   ECHOCARDIOGRAM COMPLETE Result Date: 05/31/2024    ECHOCARDIOGRAM REPORT   Patient Name:   Daniel Chung Date of Exam: 05/31/2024 Medical Rec #:  [id number]     Height:       71.0 in Accession #:    [id number]   Weight:       187.2 lb Date of Birth:  31-Mar-1956    BSA:          2.050 m Patient Age:    68 years     BP:           121/69 mmHg Patient Gender: M            HR:           85 bpm. Exam Location:  Outpatient Procedure: 2D Echo, Cardiac Doppler and Color Doppler (Both Spectral and Color            Flow Doppler were utilized during procedure). Indications:    Shortness of breath  History:        Patient has no prior history of Echocardiogram examinations.  Sonographer:    Tinnie Gosling RDCS Referring Phys: [id number] GEORGANNA ARCHER IMPRESSIONS  1. Left ventricular ejection fraction, by estimation, is 60 to 65%. The left ventricle has normal function. The left ventricle has no regional wall motion abnormalities. Left ventricular diastolic parameters were normal.  2. Right ventricular systolic function is normal. The right ventricular size is normal. Tricuspid regurgitation signal is inadequate for assessing PA pressure.  3. The mitral valve is normal in structure. No evidence of mitral valve regurgitation. No evidence of  mitral stenosis.  4. The aortic valve was not well visualized. Aortic valve regurgitation is not visualized. No aortic stenosis is present.  5. The inferior vena cava is normal in size with greater than 50% respiratory variability, suggesting right atrial pressure of 3 mmHg.  6. Technically difficult study with poor acoustic windows. FINDINGS  Left Ventricle: Left ventricular ejection fraction, by estimation, is 60 to 65%. The left ventricle has normal function. The left ventricle has no regional wall motion abnormalities. The left ventricular internal cavity size was normal in size. There is  no left ventricular hypertrophy. Left ventricular diastolic parameters were normal. Right Ventricle: The right ventricular size is normal. No increase in right ventricular wall thickness. Right ventricular systolic function is normal. Tricuspid regurgitation  signal is inadequate for assessing PA pressure. Left Atrium: Left atrial size was normal in size. Right Atrium: Right atrial size was normal in size. Pericardium: There is no evidence of pericardial effusion. Mitral Valve: The mitral valve is normal in structure. Mild mitral annular calcification. No evidence of mitral valve regurgitation. No evidence of mitral valve stenosis. Tricuspid Valve: The tricuspid valve is normal in structure. Tricuspid valve regurgitation is not demonstrated. Aortic Valve: The aortic valve was not well visualized. Aortic valve regurgitation is not visualized. No aortic stenosis is present. Pulmonic Valve: The pulmonic valve was normal in structure. Pulmonic valve regurgitation is not visualized. Aorta: The aortic root is normal in size and structure. Venous: The inferior vena cava is normal in size with greater than 50% respiratory variability, suggesting right atrial pressure of 3 mmHg. IAS/Shunts: No atrial level shunt detected by color flow Doppler.  LEFT VENTRICLE PLAX 2D LVIDd:         4.20 cm   Diastology LVIDs:         3.10 cm   LV e' medial:    10.00 cm/s LV PW:         1.00 cm   LV E/e' medial:  9.8 LV IVS:        0.90 cm   LV e' lateral:   12.40 cm/s LVOT diam:     2.10 cm   LV E/e' lateral: 7.9 LV SV:         60 LV SV Index:   29 LVOT Area:     3.46 cm LV IVRT:       103 msec  RIGHT VENTRICLE             IVC RV S prime:     13.70 cm/s  IVC diam: 1.30 cm TAPSE (M-mode): 2.2 cm                             PULMONARY VEINS                             Diastolic Velocity: 40.30 cm/s                             S/D Velocity:       1.40                             Systolic Velocity:  56.50 cm/s LEFT ATRIUM             Index        RIGHT ATRIUM  Index LA diam:        3.00 cm 1.46 cm/m   RA Area:     8.04 cm LA Vol (A2C):   20.1 ml 9.80 ml/m   RA Volume:   12.60 ml 6.15 ml/m LA Vol (A4C):   22.8 ml 11.12 ml/m LA Biplane Vol: 21.7 ml 10.58 ml/m  AORTIC VALVE LVOT Vmax:    85.90 cm/s LVOT Vmean:  57.000 cm/s LVOT VTI:    0.172 m  AORTA Ao Root diam: 3.10 cm Ao Asc diam:  3.10 cm MITRAL VALVE MV Area (PHT): 3.74 cm    SHUNTS MV Decel Time: 203 msec    Systemic VTI:  0.17 m MV E velocity: 98.50 cm/s  Systemic Diam: 2.10 cm MV A velocity: 88.90 cm/s MV E/A ratio:  1.11 Dalton McleanMD Electronically signed by Ezra Kanner Signature Date/Time: 05/31/2024/6:12:49 PM    Final    VAS US  CAROTID Result Date: 05/31/2024 Carotid Arterial Duplex Study Patient Name:  Trayshawn Durkin  Date of Exam:   05/28/2024 Medical Rec #: [id number]     Accession #:    [id number] Date of Birth: 13-Feb-1956     Patient Gender: M Patient Age:   59 years Exam Location:  [address] Procedure:      VAS US  CAROTID Referring Phys: GEORGANNA ARCHER --------------------------------------------------------------------------------  Indications:       Left bruit and patient denies any cerebrovascular symptoms. Risk Factors:      Hypertension, hyperlipidemia, Diabetes, past history of                    smoking, coronary artery disease. Comparison Study:  NA Performing Technologist: Nanetta Shad RVT  Examination Guidelines: A complete evaluation includes B-mode imaging, spectral Doppler, color Doppler, and power Doppler as needed of all accessible portions of each vessel. Bilateral testing is considered an integral part of a complete examination. Limited examinations for reoccurring indications may be performed as noted.  Right Carotid Findings: +----------+--------+--------+--------+------------------+--------+           PSV cm/sEDV cm/sStenosisPlaque DescriptionComments +----------+--------+--------+--------+------------------+--------+ CCA Prox  111     12                                         +----------+--------+--------+--------+------------------+--------+ CCA Distal60      12                                         +----------+--------+--------+--------+------------------+--------+  ICA Prox  58      15      1-39%   heterogenous               +----------+--------+--------+--------+------------------+--------+ ICA Distal61      20                                         +----------+--------+--------+--------+------------------+--------+ ECA       160     10              heterogenous               +----------+--------+--------+--------+------------------+--------+ +----------+--------+-------+----------------+-------------------+           PSV cm/sEDV cmsDescribe  Arm Pressure (mmHG) +----------+--------+-------+----------------+-------------------+ Subclavian192     0      Multiphasic, TWO875                 +----------+--------+-------+----------------+-------------------+ +---------+--------+--+--------+-+---------+ VertebralPSV cm/s57EDV cm/s9Antegrade +---------+--------+--+--------+-+---------+  Left Carotid Findings: +----------+--------+--------+--------+------------------+--------+           PSV cm/sEDV cm/sStenosisPlaque DescriptionComments +----------+--------+--------+--------+------------------+--------+ CCA Prox  125     18                                         +----------+--------+--------+--------+------------------+--------+ CCA Distal65      16                                         +----------+--------+--------+--------+------------------+--------+ ICA Prox  56      15      1-39%   heterogenous               +----------+--------+--------+--------+------------------+--------+ ICA Distal60      21                                         +----------+--------+--------+--------+------------------+--------+ ECA       327     31      >50%    heterogenous               +----------+--------+--------+--------+------------------+--------+ +----------+--------+--------+----------------+-------------------+           PSV cm/sEDV cm/sDescribe        Arm Pressure (mmHG)  +----------+--------+--------+----------------+-------------------+ Subclavian170     0       Multiphasic, TWO875                 +----------+--------+--------+----------------+-------------------+ +---------+--------+--+--------+--+---------+ VertebralPSV cm/s69EDV cm/s17Antegrade +---------+--------+--+--------+--+---------+   Summary: Right Carotid: Velocities in the right ICA are consistent with a 1-39% stenosis. Left Carotid: Velocities in the left ICA are consistent with a 1-39% stenosis.               The ECA appears >50% stenosed. Vertebrals:  Bilateral vertebral arteries demonstrate antegrade flow. Subclavians: Normal flow hemodynamics were seen in bilateral subclavian              arteries. *See table(s) above for measurements and observations.  Electronically signed by Deatrice Cage MD on 05/31/2024 at 7:49:31 AM.    Final    MYOCARDIAL PERFUSION IMAGING Result Date: 05/29/2024   Findings are consistent with inferior infarction. The study is intermediate risk due to medium size defect and TID.   No ST deviation was noted. Arrhythmias during recovery: frequent PVCs.   LV perfusion is abnormal. There is no evidence of ischemia. There is evidence of infarction. Defect 1: There is a medium defect with moderate reduction in uptake present in the apical to basal inferior location(s) that is fixed. There is abnormal wall motion in the defect area. Consistent with infarction.   Left ventricular function is normal. Nuclear stress EF: 60%. The left ventricular ejection fraction is normal (55-65%). End diastolic cavity size is normal. End systolic cavity size is normal. Evidence of transient ischemic dilation (TID) noted.   CT images were obtained for attenuation correction and were examined for the presence of coronary calcium  when appropriate.   Coronary calcium  was present  on the attenuation correction CT images. Severe coronary calcifications were present. Coronary calcifications were present  in the left anterior descending artery, left circumflex artery and right coronary artery distribution(s). Severe MAC. Moderate aortic valve calcifications. Severe aortic root atherosclerosis   Prior study not available for comparison.   MYOCARDIAL PERFUSION/CT RAD READ Result Date: 05/29/2024 CLINICAL DATA:  This over-read does not include interpretation of cardiac or coronary anatomy or pathology. The cardiac SPECT CT interpretation by the cardiologist is attached. COMPARISON:  CT October 19, 2023 FINDINGS: Vascular: Aortic and branch vessel atherosclerosis. Calcifications of the mitral annulus. Calcifications of the aortic valve and aortic annulus. Coronary artery calcifications. Mediastinum/Nodes: Gas fluid levels in the esophagus. Stable prominent mediastinal lymph nodes. Lungs/Pleura: Similar biapical pleural-parenchymal scarring with emphysematous change and bulla formation. Upper Abdomen: No acute abnormality. Musculoskeletal: Thoracic spondylosis. IMPRESSION: Gas fluid levels in the esophagus, suggestive of gastroesophageal reflux. Aortic Atherosclerosis (ICD10-I70.0) and Emphysema (ICD10-J43.9). Electronically Signed   By: Reyes Holder M.D.   On: 05/29/2024 07:06   CT ANGIO AO+BIFEM W & OR WO CONTRAST Result Date: 05/15/2024 CLINICAL DATA:  68 year old male with history of left lower extremity numbness. EXAM: CT ANGIOGRAPHY OF ABDOMINAL AORTA WITH ILIOFEMORAL RUNOFF TECHNIQUE: Multidetector CT imaging of the abdomen, pelvis and lower extremities was performed using the standard protocol during bolus administration of intravenous contrast. Multiplanar CT image reconstructions and MIPs were obtained to evaluate the vascular anatomy. RADIATION DOSE REDUCTION: This exam was performed according to the departmental dose-optimization program which includes automated exposure control, adjustment of the mA and/or kV according to patient size and/or use of iterative reconstruction technique. CONTRAST:  125mL  OMNIPAQUE IOHEXOL 350 MG/ML SOLN COMPARISON:  None Available. FINDINGS: VASCULAR Aorta: Fusiform infrarenal abdominal aortic aneurysm measuring up to 4.6 cm in maximum short axis diameter. The suprarenal abdominal aorta is normal in caliber with circumferential atherosclerotic calcifications. Celiac: Severe short-segment ostial stenosis likely due to combination of calcified atherosclerotic plaque and median arcuate ligament compression. Patent branches distally. SMA: Moderate ostial stenosis secondary to atherosclerotic plaque. Patent distally. Renals: Moderate ostial stenosis of the single right renal artery secondary to calcified atherosclerotic plaque. Patent appearing single left renal artery with scattered atherosclerotic calcifications. IMA: Patent without evidence of aneurysm, dissection, vasculitis or significant stenosis. RIGHT Lower Extremity Inflow: Severe focal ostial and moderate long segment stenosis of the common iliac artery secondary to coarse atherosclerotic calcifications. The internal iliac artery is patent with scattered atherosclerotic calcifications. Patent external iliac artery with scattered atherosclerotic calcifications. Outflow: The common and profunda femoral arteries are patent and normal caliber. The superficial femoral artery is patent with scattered atherosclerotic calcifications resulting in mild stenosis at the level of Hunter's canal. The popliteal artery is patent and normal in caliber. Runoff: Diminutive proximal posterior tibial artery with distal reconstitution. Otherwise patent three vessel runoff to the ankle. LEFT Lower Extremity Inflow: Severe focal ostial and moderate long segment stenosis of the common iliac artery secondary to coarse atherosclerotic calcifications. Severe stenosis of the internal iliac ostium secondary to atherosclerotic calcifications which appears patent distally. Moderate multifocal stenoses throughout the external iliac artery secondary to  calcified atherosclerotic plaque. 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 The aneurysm extends to the very proximal superficial femoral artery. The profundus is patent. The superficial femoral arteries otherwise patent with scattered atherosclerotic calcifications resulting in mild stenosis the level of Hunter's canal. The popliteal artery  is patent. Runoff: Patent 3 vessel runoff proximally with gradual tapering of the anterior tibial artery at the level of the ankle. Veins: No obvious venous abnormality within the limitations of this arterial phase study. Review of the MIP images confirms the above findings. NON-VASCULAR Lower chest: No acute abnormality. Hepatobiliary: No focal liver abnormality is seen. No gallstones, gallbladder wall thickening, or biliary dilatation. Pancreas: Unremarkable. No pancreatic ductal dilatation or surrounding inflammatory changes. Spleen: Normal in size without focal abnormality. Adrenals/Urinary Tract: Adrenal glands are unremarkable. Kidneys are normal, without renal calculi, focal lesion, or hydronephrosis. Bladder is unremarkable. Stomach/Bowel: Stomach is within normal limits. Appendix appears normal. Sigmoid diverticulosis. No evidence of bowel wall thickening, distention, or inflammatory changes. Lymphatic: No abdominal. Reproductive: Prostate is unremarkable. Other: Small fat containing inguinal hernias complicating features. No ascites. Musculoskeletal: No acute or significant osseous findings. IMPRESSION: VASCULAR 1. Fusiform infrarenal abdominal aortic aneurysm measuring up to 4.6 cm. Recommend follow-up CT or MR as appropriate in 12 months and referral to or continued care with vascular specialist. (Ref.: J Vasc Surg. 2018; 67:2-77 and J Am Coll Radiol 2013;10(10):789-794.) 2. 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 3. 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 4. Patent 3 vessel left lower extremity runoff with gradual tapering of the distal anterior tibial artery at the level of the ankle. 5. Diminutive proximal right posterior tibial artery with distal reconstitution. 6.  Aortic Atherosclerosis (ICD10-I70.0). NON-VASCULAR 1. No acute abdominopelvic abnormality. 2. Sigmoid diverticulosis. Ester Sides, MD [facility] Electronically Signed   By: Ester Sides M.D.   On: 05/15/2024 09:25    Disposition   Pt is being discharged home today in good condition.  Follow-up Plans & Appointments     Discharge Instructions     Amb Referral to Cardiac Rehabilitation   Complete by: As directed    Diagnosis: Coronary Stents   After initial evaluation and assessments completed: Virtual Based Care may be provided alone or in conjunction with Phase 2 Cardiac Rehab based on patient barriers.: Yes   Intensive Cardiac Rehabilitation (ICR) [facility] location only OR Traditional Cardiac Rehabilitation (TCR) *If criteria for ICR are not met will enroll in TCR [facility] only): Yes        Discharge Medications   Allergies as of 06/08/2024   No Known Allergies      Medication List     TAKE these medications    amLODipine  10 MG tablet Commonly known as: NORVASC  Take 1 tablet (10 mg total) by mouth daily.   aspirin  EC 81 MG tablet Take 1 tablet (81 mg total) by mouth daily. Swallow whole.   atorvastatin  80 MG tablet Commonly known as: LIPITOR Take 1 tablet (80 mg total) by mouth daily.   clopidogrel 75 MG tablet Commonly known as: PLAVIX Take 1 tablet (75 mg total) by mouth daily with breakfast. Start taking on: June 09, 2024   diclofenac  Sodium 1 % Gel Commonly known as: Voltaren  Arthritis Pain Apply 4 g topically 4 (four) times  daily. What changed:  when to take this reasons to take this   famotidine  20 MG tablet Commonly known as: Pepcid  Take 1 tablet (20 mg total) by mouth daily.   lisinopril  40 MG tablet Commonly known as: ZESTRIL  Take 1 tablet (40 mg total) by mouth daily.   Proventil  HFA 108 (90 Base) MCG/ACT inhaler Generic drug: albuterol  INHALE  2 PUFFS BY MOUTH EVERY 6 HOURS AS NEEDED FOR WHEEZING OR SHORTNESS OF BREATH   TUMS CHEWY BITES PO Take 1 tablet by mouth daily as needed (heartburn or indigestion).           Allergies No Known Allergies  Outstanding Labs/Studies    Duration of Discharge Encounter   Greater than 30 minutes including physician time.  Signed, Rollo FABIENE Louder, PA-C 06/08/2024, 3:32 PM

## 2024-06-08 NOTE — Progress Notes (Signed)
 CARDIAC REHAB PHASE I     Post stent education including site care, restrictions, risk factors, exercise guidelines, NTG use, antiplatelet therapy importance, heart healthy diabetic diet, smoking cessation and CRP2 reviewed. All questions and concerns addressed. Will refer to AP for CRP2. Plan for home later today.    [id number] Vaughn Asberry H[employer], RN BSN 06/08/2024 3:30 PM

## 2024-06-09 ENCOUNTER — Other Ambulatory Visit (HOSPITAL_COMMUNITY): Payer: Self-pay

## 2024-06-09 DIAGNOSIS — I2510 Atherosclerotic heart disease of native coronary artery without angina pectoris: Principal | ICD-10-CM

## 2024-06-09 DIAGNOSIS — E782 Mixed hyperlipidemia: Secondary | ICD-10-CM | POA: Diagnosis not present

## 2024-06-09 DIAGNOSIS — R9431 Abnormal electrocardiogram [ECG] [EKG]: Secondary | ICD-10-CM | POA: Diagnosis not present

## 2024-06-09 DIAGNOSIS — I10 Essential (primary) hypertension: Secondary | ICD-10-CM | POA: Diagnosis not present

## 2024-06-09 DIAGNOSIS — Z955 Presence of coronary angioplasty implant and graft: Principal | ICD-10-CM

## 2024-06-09 LAB — BASIC METABOLIC PANEL WITH GFR
Anion gap: 11 (ref 5–15)
BUN: 10 mg/dL (ref 8–23)
CO2: 22 mmol/L (ref 22–32)
Calcium: 9.5 mg/dL (ref 8.9–10.3)
Chloride: 101 mmol/L (ref 98–111)
Creatinine, Ser: 1.04 mg/dL (ref 0.61–1.24)
GFR, Estimated: >60 mL/min (ref >60)
Glucose, Bld: 122 mg/dL — ABNORMAL HIGH (ref 70–99)
Potassium: 3.7 mmol/L (ref 3.5–5.1)
Sodium: 134 mmol/L — ABNORMAL LOW (ref 135–145)

## 2024-06-09 LAB — CBC
HCT: 42.8 % (ref 39.0–52.0)
Hemoglobin: 15.3 g/dL (ref 13.0–17.0)
MCH: 31.7 pg (ref 26.0–34.0)
MCHC: 35.7 g/dL (ref 30.0–36.0)
MCV: 88.6 fL (ref 80.0–100.0)
Platelets: 223 K/uL (ref 150–400)
RBC: 4.83 MIL/uL (ref 4.22–5.81)
RDW: 12.7 % (ref 11.5–15.5)
WBC: 13 K/uL — ABNORMAL HIGH (ref 4.0–10.5)
nRBC: 0 % (ref 0.0–0.2)

## 2024-06-09 MED ORDER — NITROGLYCERIN 0.4 MG SL SUBL
0.4000 mg | SUBLINGUAL_TABLET | SUBLINGUAL | 2 refills | Status: AC | PRN
  Filled 2024-06-09: qty 25, 8d supply, fill #0

## 2024-06-09 NOTE — Discharge Summary (Addendum)
 Discharge Summary   Patient ID: Daniel Chung MRN: [id number]; DOB: Dec 24, 1955  Admit date: 06/08/2024 Discharge date: 06/09/2024  PCP:  Tobie Suzzane POUR, MD   [facility] Providers Cardiologist:  Georganna Archer, MD   Discharge Diagnoses  Principal Problem:   CAD (coronary artery disease) Active Problems:   Status post coronary artery stent placement   Diagnostic Studies/Procedures  Cardiac Catheterization 06/08/2024:   Mid Cx to Dist Cx lesion is 99% stenosed.   Mid Cx lesion is 70% stenosed.   Mid RCA lesion is 80% stenosed.   Prox RCA lesion is 80% stenosed.   A drug-eluting stent was successfully placed using a STENT SYNERGY XD 2.50X16.   A drug-eluting stent was successfully placed using a STENT SYNERGY XD [id number].   A drug-eluting stent was successfully placed using a STENT SYNERGY XD 3.0X38.   Post intervention, there is a 0% residual stenosis.   Post intervention, there is a 0% residual stenosis.   Post intervention, there is a 0% residual stenosis.   Post intervention, there is a 0% residual stenosis.   Severe stenosis mid Circumflex Successful PTCA/DES x 2 mid Circumflex Severe stenosis proximal and mid RCA. Virtual FFR 0.71 (Cathworks) Successful PTCA/DES x 1 proximal and mid RCA Normal LVEDP   Recommendations: Will need DAPT with ASA and Plavix for 6 months before it is interrupted. Continue statin. Same day post PCI discharge.    Diagnostic Dominance: Right  Intervention      _____________   History of Present Illness   Daniel Chung is a 68 y.o. male with Daniel Chung is a 68 y.o. male with past medical history of hypertension, hyperlipidemia, type 2 diabetes, PAD s/p L. Fem endarterectomy in 2011, L groin pseudoaneurysm, prediabetes, AAA, prior tobacco use, COPD.    Patient had been referred to Dr. Archer for preoperative evaluation. Patient reported having episodes of shortness of breath on exertion. He underwent stress test on 05/28/24  that showed evidence of inferior infarction.  Echo 05/31/2024 showed EF 60-65%, no regional wall motion abnormalities, normal RV systolic function, no significant valvular abnormalities.   Patient was scheduled for outpatient cardiac catheterization.  Due to ongoing oozing oozing from the cath site and patient reporting feeling unwell with nausea and hot and sweaty postcardiac catheterization, they were admitted to the hospital for observation.  Hospital Course   Consultants:    CAD Hyperlipidemia  Patient underwent cardiac catheterization which demonstrated severe stenosis in the mid LCx.  Status post PTCA/DES x 2 to the mid LCx.  Additionally had severe stenosis of the proximal and mid RCA with FFR 0.71.  He had successful PTCA/DES x1 of the proximal and mid RCA.   Plan is for DAPT therapy with aspirin  and Plavix for 6 months uninterrupted. Continue with atorvastatin  80 mg. Will ensure that they have nitroglycerin at home. EF normal with no significant valvular disease. On outpatient echocardiogram. LDL 7 months ago was 69. Recheck outpatient.   Hypertension Blood pressure here has generally been controlled. Continue with lisinopril  40 mg and amlodipine  10 mg.  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  Most recent CTA demonstrates acute symmetrical bilateral severe ostial stenosis of the common iliac artery.  He had patent three-vessel left lower extremity runoff.  AAA Fusiform infrarenal abdominal aortic aneurysm measuring up to 4.6 cm. Recommend follow-up CT or MR as appropriate in 12 months.  Leukocytosis Possibly reactive.  No signs or symptoms that would suggest acute infection.  Reassess outpatient and determine need for repeat CBC.  Patient seen and examined by Dr. Okey and deemed stable for discharge.  Medications will be sent to our [facility] pharmacy.  Follow-up has been arranged.  _____________  Discharge Vitals Blood pressure  (!) 148/75, pulse 91, temperature 99.3 F (37.4 C), temperature source Oral, resp. rate 16, height 5' 11 (1.803 m), weight 82.1 kg, SpO2 94%.  Filed Weights   06/08/24 1022 06/08/24 2108  Weight: 83.9 kg 82.1 kg    Labs & Radiologic Studies  CBC Recent Labs    06/09/24 0437  WBC 13.0*  HGB 15.3  HCT 42.8  MCV 88.6  PLT 223   Basic Metabolic Panel Recent Labs    88/91/74 0437  NA 134*  K 3.7  CL 101  CO2 22  GLUCOSE 122*  BUN 10  CREATININE 1.04  CALCIUM  9.5   Liver Function Tests No results for input(s): AST, ALT, ALKPHOS, BILITOT, PROT, ALBUMIN in the last 72 hours. No results for input(s): LIPASE, AMYLASE in the last 72 hours. High Sensitivity Troponin:   No results for input(s): TROPONINIHS in the last 720 hours.  No results for input(s): TRNPT in the last 720 hours.  BNP Invalid input(s): POCBNP No results for input(s): PROBNP in the last 72 hours.  No results for input(s): BNP in the last 72 hours.  D-Dimer No results for input(s): DDIMER in the last 72 hours. Hemoglobin A1C No results for input(s): HGBA1C in the last 72 hours. Fasting Lipid Panel No results for input(s): CHOL, HDL, LDLCALC, TRIG, CHOLHDL, LDLDIRECT in the last 72 hours. No results found for: LIPOA  Thyroid  Function Tests No results for input(s): TSH, T4TOTAL, T3FREE, THYROIDAB in the last 72 hours.  Invalid input(s): FREET3 _____________  CARDIAC CATHETERIZATION Result Date: 06/08/2024   Mid Cx to Dist Cx lesion is 99% stenosed.   Mid Cx lesion is 70% stenosed.   Mid RCA lesion is 80% stenosed.   Prox RCA lesion is 80% stenosed.   A drug-eluting stent was successfully placed using a STENT SYNERGY XD 2.50X16.   A drug-eluting stent was successfully placed using a STENT SYNERGY XD [id number].   A drug-eluting stent was successfully placed using a STENT SYNERGY XD 3.0X38.   Post intervention, there is a 0% residual stenosis.   Post  intervention, there is a 0% residual stenosis.   Post intervention, there is a 0% residual stenosis.   Post intervention, there is a 0% residual stenosis. Severe stenosis mid Circumflex Successful PTCA/DES x 2 mid Circumflex Severe stenosis proximal and mid RCA. Virtual FFR 0.71 (Cathworks) Successful PTCA/DES x 1 proximal and mid RCA Normal LVEDP Recommendations: Will need DAPT with ASA and Plavix for 6 months before it is interrupted. Continue statin. Same day post PCI discharge.   ECHOCARDIOGRAM COMPLETE Result Date: 05/31/2024    ECHOCARDIOGRAM REPORT   Patient Name:   NIL XIONG Date of Exam: 05/31/2024 Medical Rec #:  [id number]    Height:       71.0 in Accession #:    [id number]   Weight:       187.2 lb Date of Birth:  May 08, 1956    BSA:          2.050 m Patient Age:    68 years     BP:           121/69 mmHg Patient Gender: M            HR:  85 bpm. Exam Location:  Outpatient Procedure: 2D Echo, Cardiac Doppler and Color Doppler (Both Spectral and Color            Flow Doppler were utilized during procedure). Indications:    Shortness of breath  History:        Patient has no prior history of Echocardiogram examinations.  Sonographer:    Tinnie Gosling RDCS Referring Phys: [id number] GEORGANNA ARCHER IMPRESSIONS  1. Left ventricular ejection fraction, by estimation, is 60 to 65%. The left ventricle has normal function. The left ventricle has no regional wall motion abnormalities. Left ventricular diastolic parameters were normal.  2. Right ventricular systolic function is normal. The right ventricular size is normal. Tricuspid regurgitation signal is inadequate for assessing PA pressure.  3. The mitral valve is normal in structure. No evidence of mitral valve regurgitation. No evidence of mitral stenosis.  4. The aortic valve was not well visualized. Aortic valve regurgitation is not visualized. No aortic stenosis is present.  5. The inferior vena cava is normal in size with greater than 50%  respiratory variability, suggesting right atrial pressure of 3 mmHg.  6. Technically difficult study with poor acoustic windows. FINDINGS  Left Ventricle: Left ventricular ejection fraction, by estimation, is 60 to 65%. The left ventricle has normal function. The left ventricle has no regional wall motion abnormalities. The left ventricular internal cavity size was normal in size. There is  no left ventricular hypertrophy. Left ventricular diastolic parameters were normal. Right Ventricle: The right ventricular size is normal. No increase in right ventricular wall thickness. Right ventricular systolic function is normal. Tricuspid regurgitation signal is inadequate for assessing PA pressure. Left Atrium: Left atrial size was normal in size. Right Atrium: Right atrial size was normal in size. Pericardium: There is no evidence of pericardial effusion. Mitral Valve: The mitral valve is normal in structure. Mild mitral annular calcification. No evidence of mitral valve regurgitation. No evidence of mitral valve stenosis. Tricuspid Valve: The tricuspid valve is normal in structure. Tricuspid valve regurgitation is not demonstrated. Aortic Valve: The aortic valve was not well visualized. Aortic valve regurgitation is not visualized. No aortic stenosis is present. Pulmonic Valve: The pulmonic valve was normal in structure. Pulmonic valve regurgitation is not visualized. Aorta: The aortic root is normal in size and structure. Venous: The inferior vena cava is normal in size with greater than 50% respiratory variability, suggesting right atrial pressure of 3 mmHg. IAS/Shunts: No atrial level shunt detected by color flow Doppler.  LEFT VENTRICLE PLAX 2D LVIDd:         4.20 cm   Diastology LVIDs:         3.10 cm   LV e' medial:    10.00 cm/s LV PW:         1.00 cm   LV E/e' medial:  9.8 LV IVS:        0.90 cm   LV e' lateral:   12.40 cm/s LVOT diam:     2.10 cm   LV E/e' lateral: 7.9 LV SV:         60 LV SV Index:   29 LVOT  Area:     3.46 cm LV IVRT:       103 msec  RIGHT VENTRICLE             IVC RV S prime:     13.70 cm/s  IVC diam: 1.30 cm TAPSE (M-mode): 2.2 cm  PULMONARY VEINS                             Diastolic Velocity: 40.30 cm/s                             S/D Velocity:       1.40                             Systolic Velocity:  56.50 cm/s LEFT ATRIUM             Index        RIGHT ATRIUM          Index LA diam:        3.00 cm 1.46 cm/m   RA Area:     8.04 cm LA Vol (A2C):   20.1 ml 9.80 ml/m   RA Volume:   12.60 ml 6.15 ml/m LA Vol (A4C):   22.8 ml 11.12 ml/m LA Biplane Vol: 21.7 ml 10.58 ml/m  AORTIC VALVE LVOT Vmax:   85.90 cm/s LVOT Vmean:  57.000 cm/s LVOT VTI:    0.172 m  AORTA Ao Root diam: 3.10 cm Ao Asc diam:  3.10 cm MITRAL VALVE MV Area (PHT): 3.74 cm    SHUNTS MV Decel Time: 203 msec    Systemic VTI:  0.17 m MV E velocity: 98.50 cm/s  Systemic Diam: 2.10 cm MV A velocity: 88.90 cm/s MV E/A ratio:  1.11 Dalton McleanMD Electronically signed by Ezra Kanner Signature Date/Time: 05/31/2024/6:12:49 PM    Final    VAS US  CAROTID Result Date: 05/31/2024 Carotid Arterial Duplex Study Patient Name:  Kanishk Stroebel  Date of Exam:   05/28/2024 Medical Rec #: [id number]     Accession #:    [id number] Date of Birth: [birth year]     Patient Gender: M Patient Age:   14 years Exam Location:  [address] Procedure:      VAS US  CAROTID Referring Phys: GEORGANNA ARCHER --------------------------------------------------------------------------------  Indications:       Left bruit and patient denies any cerebrovascular symptoms. Risk Factors:      Hypertension, hyperlipidemia, Diabetes, past history of                    smoking, coronary artery disease. Comparison Study:  NA Performing Technologist: Nanetta Shad RVT  Examination Guidelines: A complete evaluation includes B-mode imaging, spectral Doppler, color Doppler, and power Doppler as needed of all accessible portions of each vessel.  Bilateral testing is considered an integral part of a complete examination. Limited examinations for reoccurring indications may be performed as noted.  Right Carotid Findings: +----------+--------+--------+--------+------------------+--------+           PSV cm/sEDV cm/sStenosisPlaque DescriptionComments +----------+--------+--------+--------+------------------+--------+ CCA Prox  111     12                                         +----------+--------+--------+--------+------------------+--------+ CCA Distal60      12                                         +----------+--------+--------+--------+------------------+--------+ ICA Prox  58  15      1-39%   heterogenous               +----------+--------+--------+--------+------------------+--------+ ICA Distal61      20                                         +----------+--------+--------+--------+------------------+--------+ ECA       160     10              heterogenous               +----------+--------+--------+--------+------------------+--------+ +----------+--------+-------+----------------+-------------------+           PSV cm/sEDV cmsDescribe        Arm Pressure (mmHG) +----------+--------+-------+----------------+-------------------+ Subclavian192     0      Multiphasic, TWO875                 +----------+--------+-------+----------------+-------------------+ +---------+--------+--+--------+-+---------+ VertebralPSV cm/s57EDV cm/s9Antegrade +---------+--------+--+--------+-+---------+  Left Carotid Findings: +----------+--------+--------+--------+------------------+--------+           PSV cm/sEDV cm/sStenosisPlaque DescriptionComments +----------+--------+--------+--------+------------------+--------+ CCA Prox  125     18                                         +----------+--------+--------+--------+------------------+--------+ CCA Distal65      16                                          +----------+--------+--------+--------+------------------+--------+ ICA Prox  56      15      1-39%   heterogenous               +----------+--------+--------+--------+------------------+--------+ ICA Distal60      21                                         +----------+--------+--------+--------+------------------+--------+ ECA       327     31      >50%    heterogenous               +----------+--------+--------+--------+------------------+--------+ +----------+--------+--------+----------------+-------------------+           PSV cm/sEDV cm/sDescribe        Arm Pressure (mmHG) +----------+--------+--------+----------------+-------------------+ Subclavian170     0       Multiphasic, TWO875                 +----------+--------+--------+----------------+-------------------+ +---------+--------+--+--------+--+---------+ VertebralPSV cm/s69EDV cm/s17Antegrade +---------+--------+--+--------+--+---------+   Summary: Right Carotid: Velocities in the right ICA are consistent with a 1-39% stenosis. Left Carotid: Velocities in the left ICA are consistent with a 1-39% stenosis.               The ECA appears >50% stenosed. Vertebrals:  Bilateral vertebral arteries demonstrate antegrade flow. Subclavians: Normal flow hemodynamics were seen in bilateral subclavian              arteries. *See table(s) above for measurements and observations.  Electronically signed by Deatrice Cage MD on 05/31/2024 at 7:49:31 AM.    Final    MYOCARDIAL PERFUSION IMAGING Result Date: 05/29/2024   Findings are consistent with inferior infarction. The study is  intermediate risk due to medium size defect and TID.   No ST deviation was noted. Arrhythmias during recovery: frequent PVCs.   LV perfusion is abnormal. There is no evidence of ischemia. There is evidence of infarction. Defect 1: There is a medium defect with moderate reduction in uptake present in the apical to basal inferior  location(s) that is fixed. There is abnormal wall motion in the defect area. Consistent with infarction.   Left ventricular function is normal. Nuclear stress EF: 60%. The left ventricular ejection fraction is normal (55-65%). End diastolic cavity size is normal. End systolic cavity size is normal. Evidence of transient ischemic dilation (TID) noted.   CT images were obtained for attenuation correction and were examined for the presence of coronary calcium  when appropriate.   Coronary calcium  was present on the attenuation correction CT images. Severe coronary calcifications were present. Coronary calcifications were present in the left anterior descending artery, left circumflex artery and right coronary artery distribution(s). Severe MAC. Moderate aortic valve calcifications. Severe aortic root atherosclerosis   Prior study not available for comparison.   MYOCARDIAL PERFUSION/CT RAD READ Result Date: 05/29/2024 CLINICAL DATA:  This over-read does not include interpretation of cardiac or coronary anatomy or pathology. The cardiac SPECT CT interpretation by the cardiologist is attached. COMPARISON:  CT October 19, 2023 FINDINGS: Vascular: Aortic and branch vessel atherosclerosis. Calcifications of the mitral annulus. Calcifications of the aortic valve and aortic annulus. Coronary artery calcifications. Mediastinum/Nodes: Gas fluid levels in the esophagus. Stable prominent mediastinal lymph nodes. Lungs/Pleura: Similar biapical pleural-parenchymal scarring with emphysematous change and bulla formation. Upper Abdomen: No acute abnormality. Musculoskeletal: Thoracic spondylosis. IMPRESSION: Gas fluid levels in the esophagus, suggestive of gastroesophageal reflux. Aortic Atherosclerosis (ICD10-I70.0) and Emphysema (ICD10-J43.9). Electronically Signed   By: Reyes Holder M.D.   On: 05/29/2024 07:06   CT ANGIO AO+BIFEM W & OR WO CONTRAST Result Date: 05/15/2024 CLINICAL DATA:  68 year old male with history of left  lower extremity numbness. EXAM: CT ANGIOGRAPHY OF ABDOMINAL AORTA WITH ILIOFEMORAL RUNOFF TECHNIQUE: Multidetector CT imaging of the abdomen, pelvis and lower extremities was performed using the standard protocol during bolus administration of intravenous contrast. Multiplanar CT image reconstructions and MIPs were obtained to evaluate the vascular anatomy. RADIATION DOSE REDUCTION: This exam was performed according to the departmental dose-optimization program which includes automated exposure control, adjustment of the mA and/or kV according to patient size and/or use of iterative reconstruction technique. CONTRAST:  125mL OMNIPAQUE IOHEXOL 350 MG/ML SOLN COMPARISON:  None Available. FINDINGS: VASCULAR Aorta: Fusiform infrarenal abdominal aortic aneurysm measuring up to 4.6 cm in maximum short axis diameter. The suprarenal abdominal aorta is normal in caliber with circumferential atherosclerotic calcifications. Celiac: Severe short-segment ostial stenosis likely due to combination of calcified atherosclerotic plaque and median arcuate ligament compression. Patent branches distally. SMA: Moderate ostial stenosis secondary to atherosclerotic plaque. Patent distally. Renals: Moderate ostial stenosis of the single right renal artery secondary to calcified atherosclerotic plaque. Patent appearing single left renal artery with scattered atherosclerotic calcifications. IMA: Patent without evidence of aneurysm, dissection, vasculitis or significant stenosis. RIGHT Lower Extremity Inflow: Severe focal ostial and moderate long segment stenosis of the common iliac artery secondary to coarse atherosclerotic calcifications. The internal iliac artery is patent with scattered atherosclerotic calcifications. Patent external iliac artery with scattered atherosclerotic calcifications. Outflow: The common and profunda femoral arteries are patent and normal caliber. The superficial femoral artery is patent with scattered  atherosclerotic calcifications resulting in mild stenosis at the level of Hunter's canal. The popliteal  artery is patent and normal in caliber. Runoff: Diminutive proximal posterior tibial artery with distal reconstitution. Otherwise patent three vessel runoff to the ankle. LEFT Lower Extremity Inflow: Severe focal ostial and moderate long segment stenosis of the common iliac artery secondary to coarse atherosclerotic calcifications. Severe stenosis of the internal iliac ostium secondary to atherosclerotic calcifications which appears patent distally. Moderate multifocal stenoses throughout the external iliac artery secondary to calcified atherosclerotic plaque. 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 The aneurysm extends to the very proximal superficial femoral artery. The profundus is patent. The superficial femoral arteries otherwise patent with scattered atherosclerotic calcifications resulting in mild stenosis the level of Hunter's canal. The popliteal artery is patent. Runoff: Patent 3 vessel runoff proximally with gradual tapering of the anterior tibial artery at the level of the ankle. Veins: No obvious venous abnormality within the limitations of this arterial phase study. Review of the MIP images confirms the above findings. NON-VASCULAR Lower chest: No acute abnormality. Hepatobiliary: No focal liver abnormality is seen. No gallstones, gallbladder wall thickening, or biliary dilatation. Pancreas: Unremarkable. No pancreatic ductal dilatation or surrounding inflammatory changes. Spleen: Normal in size without focal abnormality. Adrenals/Urinary Tract: Adrenal glands are unremarkable. Kidneys are normal, without renal calculi, focal lesion, or hydronephrosis. Bladder is unremarkable. Stomach/Bowel: Stomach is within normal limits. Appendix appears normal. Sigmoid  diverticulosis. No evidence of bowel wall thickening, distention, or inflammatory changes. Lymphatic: No abdominal. Reproductive: Prostate is unremarkable. Other: Small fat containing inguinal hernias complicating features. No ascites. Musculoskeletal: No acute or significant osseous findings. IMPRESSION: VASCULAR 1. Fusiform infrarenal abdominal aortic aneurysm measuring up to 4.6 cm. Recommend follow-up CT or MR as appropriate in 12 months and referral to or continued care with vascular specialist. (Ref.: J Vasc Surg. 2018; 67:2-77 and J Am Coll Radiol 2013;10(10):789-794.) 2. 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 3. 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 4. Patent 3 vessel left lower extremity runoff with gradual tapering of the distal anterior tibial artery at the level of the ankle. 5. Diminutive proximal right posterior tibial artery with distal reconstitution. 6.  Aortic Atherosclerosis (ICD10-I70.0). NON-VASCULAR 1. No acute abdominopelvic abnormality. 2. Sigmoid diverticulosis. Ester Sides, MD [facility] Electronically Signed   By: Ester Sides M.D.   On: 05/15/2024 09:25    Disposition Pt is being discharged home today in good condition.  Follow-up Plans & Appointments  Discharge Instructions     Amb Referral to Cardiac Rehabilitation   Complete by: As directed    Diagnosis: Coronary Stents   After initial evaluation and assessments completed: Virtual Based Care may be provided alone or in conjunction with Phase 2 Cardiac Rehab based on patient barriers.: Yes   Intensive Cardiac Rehabilitation (ICR) [facility] location only OR Traditional Cardiac Rehabilitation (TCR) *If criteria for ICR are not met will enroll in TCR [facility] only): Yes       Discharge Medications Allergies as of 06/09/2024   No Known  Allergies      Medication List     TAKE these medications    amLODipine  10 MG tablet Commonly known as: NORVASC  Take 1 tablet (10 mg total) by mouth daily.   aspirin  EC 81 MG tablet Take 1 tablet (81 mg total) by mouth daily. Swallow whole.  atorvastatin  80 MG tablet Commonly known as: LIPITOR Take 1 tablet (80 mg total) by mouth daily.   clopidogrel 75 MG tablet Commonly known as: PLAVIX Take 1 tablet (75 mg total) by mouth daily with breakfast.   diclofenac  Sodium 1 % Gel Commonly known as: Voltaren  Arthritis Pain Apply 4 g topically 4 (four) times daily. What changed:  when to take this reasons to take this   famotidine  20 MG tablet Commonly known as: Pepcid  Take 1 tablet (20 mg total) by mouth daily.   lisinopril  40 MG tablet Commonly known as: ZESTRIL  Take 1 tablet (40 mg total) by mouth daily.   nitroGLYCERIN 0.4 MG SL tablet Commonly known as: Nitrostat Place 1 tablet (0.4 mg total) under the tongue every 5 (five) minutes as needed for chest pain.   Proventil  HFA 108 (90 Base) MCG/ACT inhaler Generic drug: albuterol  INHALE 2 PUFFS BY MOUTH EVERY 6 HOURS AS NEEDED FOR WHEEZING OR SHORTNESS OF BREATH   TUMS CHEWY BITES PO Take 1 tablet by mouth daily as needed (heartburn or indigestion).         Outstanding Labs/Studies Lipid panel.   Duration of Discharge Encounter: APP Time: 20 minutes   Signed, Thom LITTIE Sluder, PA-C 06/09/2024, 10:45 AM

## 2024-06-09 NOTE — Progress Notes (Signed)
 Rounding Note   Patient Name: Daniel Chung Date of Encounter: 06/09/2024  [facility] Cardiologist: Georganna Archer, MD   Subjective No CP  NO SOB   Scheduled Meds:  amLODipine   10 mg Oral Daily   aspirin  EC  81 mg Oral Daily   atorvastatin   80 mg Oral Daily   clopidogrel  75 mg Oral Q breakfast   enoxaparin (LOVENOX) injection  40 mg Subcutaneous Q24H   famotidine   20 mg Oral Daily   free water  500 mL Oral Once   lisinopril   40 mg Oral Daily   sodium chloride flush  3 mL Intravenous Q12H   Continuous Infusions:  sodium chloride     PRN Meds: sodium chloride, acetaminophen, ondansetron (ZOFRAN) IV, sodium chloride flush   Vital Signs  Vitals:   06/08/24 2108 06/08/24 2332 06/09/24 0400 06/09/24 0742  BP: (!) 145/79 135/80 (!) 140/76 (!) 148/75  Pulse: 97 86 90 91  Resp: 18 18 20 16   Temp: 98 F (36.7 C) 98 F (36.7 C) 98.1 F (36.7 C) 99.3 F (37.4 C)  TempSrc: Oral Oral Oral Oral  SpO2: 97% 93% 92% 94%  Weight: 82.1 kg     Height: 5' 11 (1.803 m)       Intake/Output Summary (Last 24 hours) at 06/09/2024 1030 Last data filed at 06/09/2024 1011 Gross per 24 hour  Intake 480 ml  Output --  Net 480 ml      06/08/2024    9:08 PM 06/08/2024   10:22 AM 05/17/2024    1:40 PM  Last 3 Weights  Weight (lbs) 181 lb 1.6 oz 185 lb 187 lb 3.2 oz  Weight (kg) 82.146 kg 83.915 kg 84.913 kg      Telemetry SR  - Personally Reviewed  ECG  No new  - Personally Reviewed  Physical Exam  GEN: No acute distress.   Neck: No JVD Cardiac: RRR, no murmurs, Respiratory: Clear to auscultation bilaterally. GI: Soft, nontender, non-distended  Ext  R wrist with mild swelling  Dressing dry  Labs High Sensitivity Troponin:  No results for input(s): TROPONINIHS in the last 720 hours.   Chemistry Recent Labs  Lab 06/09/24 0437  NA 134*  K 3.7  CL 101  CO2 22  GLUCOSE 122*  BUN 10  CREATININE 1.04  CALCIUM  9.5  GFRNONAA >60  ANIONGAP 11    Lipids No  results for input(s): CHOL, TRIG, HDL, LABVLDL, LDLCALC, CHOLHDL in the last 168 hours.  Hematology Recent Labs  Lab 06/09/24 0437  WBC 13.0*  RBC 4.83  HGB 15.3  HCT 42.8  MCV 88.6  MCH 31.7  MCHC 35.7  RDW 12.7  PLT 223   Thyroid  No results for input(s): TSH, FREET4 in the last 168 hours.  BNPNo results for input(s): BNP, PROBNP in the last 168 hours.  DDimer No results for input(s): DDIMER in the last 168 hours.   Radiology  CARDIAC CATHETERIZATION Result Date: 06/08/2024   Mid Cx to Dist Cx lesion is 99% stenosed.   Mid Cx lesion is 70% stenosed.   Mid RCA lesion is 80% stenosed.   Prox RCA lesion is 80% stenosed.   A drug-eluting stent was successfully placed using a STENT SYNERGY XD 2.50X16.   A drug-eluting stent was successfully placed using a STENT SYNERGY XD [id number].   A drug-eluting stent was successfully placed using a STENT SYNERGY XD 3.0X38.   Post intervention, there is a 0% residual stenosis.   Post intervention,  there is a 0% residual stenosis.   Post intervention, there is a 0% residual stenosis.   Post intervention, there is a 0% residual stenosis. Severe stenosis mid Circumflex Successful PTCA/DES x 2 mid Circumflex Severe stenosis proximal and mid RCA. Virtual FFR 0.71 (Cathworks) Successful PTCA/DES x 1 proximal and mid RCA Normal LVEDP Recommendations: Will need DAPT with ASA and Plavix for 6 months before it is interrupted. Continue statin. Same day post PCI discharge.    Cardiac Studies LHC   06/08/24   Mid Cx to Dist Cx lesion is 99% stenosed.   Mid Cx lesion is 70% stenosed.   Mid RCA lesion is 80% stenosed.   Prox RCA lesion is 80% stenosed.   A drug-eluting stent was successfully placed using a STENT SYNERGY XD 2.50X16.   A drug-eluting stent was successfully placed using a STENT SYNERGY XD [id number].   A drug-eluting stent was successfully placed using a STENT SYNERGY XD 3.0X38.   Post intervention, there is a 0% residual stenosis.    Post intervention, there is a 0% residual stenosis.   Post intervention, there is a 0% residual stenosis.   Post intervention, there is a 0% residual stenosis.   Severe stenosis mid Circumflex Successful PTCA/DES x 2 mid Circumflex Severe stenosis proximal and mid RCA. Virtual FFR 0.71 (Cathworks) Successful PTCA/DES x 1 proximal and mid RCA Normal LVEDP   Recommendations: Will need DAPT with ASA and Plavix for 6 months before it is interrupted. Continue statin. Same day post PCI discharge.   Echo OCt 2025  1. Left ventricular ejection fraction, by estimation, is 60 to 65%. The  left ventricle has normal function. The left ventricle has no regional  wall motion abnormalities. Left ventricular diastolic parameters were  normal.   2. Right ventricular systolic function is normal. The right ventricular  size is normal. Tricuspid regurgitation signal is inadequate for assessing  PA pressure.   3. The mitral valve is normal in structure. No evidence of mitral valve  regurgitation. No evidence of mitral stenosis.   4. The aortic valve was not well visualized. Aortic valve regurgitation  is not visualized. No aortic stenosis is present.   5. The inferior vena cava is normal in size with greater than 50%  respiratory variability, suggesting right atrial pressure of 3 mmHg.   6. Technically difficult study with poor acoustic windows.    Patient Profile   68 y.o. male   Assessment & Plan   1  CAD  Pt s/p PTCA/DES multiple yesterday  Had some oozing R Radial site  Now dry  OK to d/c home on ASA and PLavix  2   HTN   BP labile   Follow as outpt  No changes for now   3  DOE  Follow     HL   NOt discussion by L Floretta  Pt would like to work on diet   WIll need close follow up       Vina Gull, MD  06/09/2024, 10:30 AM

## 2024-06-09 NOTE — Care Management Obs Status (Signed)
 MEDICARE OBSERVATION STATUS NOTIFICATION   Patient Details  Name: Daniel Chung MRN: [id number] Date of Birth: Jan 12, 1956   Medicare Observation Status Notification Given:  Yes    Sagrario Lineberry G., RN 06/09/2024, 9:32 AM

## 2024-06-09 NOTE — Plan of Care (Signed)

## 2024-06-09 NOTE — Progress Notes (Signed)
 Discharge instructions, RX's and follow up appts explained and provided to patient verbalized understanding. Patient left floor via wheelchair accompanied by staff. Picking up TOC meds on the way out. No c/o pain or shortness of breath at d/c.  Reilly Molchan, Cena Helling, RN

## 2024-06-10 ENCOUNTER — Encounter (HOSPITAL_COMMUNITY): Payer: Self-pay | Admitting: Cardiovascular Disease

## 2024-06-10 ENCOUNTER — Other Ambulatory Visit: Payer: Self-pay | Admitting: Internal Medicine

## 2024-06-10 DIAGNOSIS — K219 Gastro-esophageal reflux disease without esophagitis: Secondary | ICD-10-CM

## 2024-06-11 ENCOUNTER — Telehealth: Payer: Self-pay

## 2024-06-11 NOTE — Telephone Encounter (Signed)
 Copied from CRM [id number]. Topic: Clinical - Medication Question >> Jun 11, 2024  8:56 AM Ivette P wrote: Reason for CRM: Pt has 2 questions:  1.  famotidine  (PEPCID ) 20 MG tablet - since had the surgery pt havent had issues with heart burn.  pt wuld like to know if he takes them as scripted or if can be taken as needed.     2.P thaving issues with medication and it not giving the full dosag on the script would like to know when he should be taking.   lisinopril  (ZESTRIL ) 40 MG tablet amLODipine  (NORVASC ) 10 MG tablet  clopidogrel (PLAVIX) 75 MG tablet

## 2024-06-11 NOTE — Telephone Encounter (Signed)
Pt informed

## 2024-06-14 ENCOUNTER — Other Ambulatory Visit (HOSPITAL_COMMUNITY)

## 2024-06-14 ENCOUNTER — Telehealth: Payer: Self-pay

## 2024-06-14 NOTE — Telephone Encounter (Signed)
 Patient had heart cath and stenting early November.  Has 2 week post cath visit with heart care on 11/26.  Next office visit with cardiology is 1/13. Would like patient to have opportunity to heal and follow up with cardiology in January prior to scheduling vascular surgery.

## 2024-06-22 ENCOUNTER — Ambulatory Visit: Admitting: General Practice

## 2024-06-27 ENCOUNTER — Encounter: Payer: Self-pay | Admitting: Cardiology

## 2024-06-27 ENCOUNTER — Ambulatory Visit: Attending: Cardiology | Admitting: Cardiology

## 2024-06-27 VITALS — BP 124/66 | HR 65 | Ht 71.0 in | Wt 184.0 lb

## 2024-06-27 DIAGNOSIS — I2510 Atherosclerotic heart disease of native coronary artery without angina pectoris: Secondary | ICD-10-CM | POA: Insufficient documentation

## 2024-06-27 DIAGNOSIS — E782 Mixed hyperlipidemia: Secondary | ICD-10-CM | POA: Insufficient documentation

## 2024-06-27 DIAGNOSIS — I739 Peripheral vascular disease, unspecified: Secondary | ICD-10-CM | POA: Diagnosis present

## 2024-06-27 DIAGNOSIS — I10 Essential (primary) hypertension: Secondary | ICD-10-CM | POA: Diagnosis not present

## 2024-06-27 DIAGNOSIS — I7143 Infrarenal abdominal aortic aneurysm, without rupture: Secondary | ICD-10-CM | POA: Diagnosis present

## 2024-06-27 NOTE — Progress Notes (Addendum)
 Cardiology Office Note:  .   Date:  06/27/2024  ID:  Daniel Chung, DOB 1956/07/10, MRN [id number] PCP: Tobie Suzzane POUR, MD  [facility] Providers Cardiologist:  Georganna Archer, MD {  History of Present Illness: .   Daniel Chung is a 68 y.o. male with history of hypertension, hyperlipidemia, type 2 diabetes, PAD s/p L. Fem endarterectomy in 2011, L groin pseudoaneurysm, prediabetes, AAA, prior tobacco use, COPD.     CAD Referred to cardiology for preoperative evaluation for pseudoaneurysm repair.  Reported SOB and with significant coronary calcifications on imaging. 05/2024 Myoview  with evidence of inferior infarct.  EF 60 to 65% no wall motion abnormalities. 06/2024 outpatient cardiac catheterization with severe stenosis in the mid LCx status post PTCA/DES x 2 in the mid LCx.  Had severe stenosis of proximal/mid RCA with FFR 0.71 with PTCA/DES x 1 to the proximal and mid RCA.    Social history  Quit smoking 2017 but had smoked till the age of 42.  Smoked 1.5 packs/day.  Infrequently drinks alcohol. Reports THC No family history of CAD.    Patient has previously been followed by vascular surgery for what sounded like left common femoral enterectomy and bovine pericardial patch angioplasty over 15 years ago.  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  He was referred to cardiology for preoperative evaluation and had abnormal stress test which prompted cardiac catheterization with stents placed to his RCA and Lcx.  Today patient presents for follow-up.  He has no complaints today and has had no issues from his radial cath site.  He reports that prior to his stenting he was having episodes of burning chest pain that he related to acid reflux.  Since his stent placement he has had longer periods of sleep and no longer having these episodes nocturnally.  He is active walking around half a mile each day without any exertional symptoms.   Not requiring nitroglycerin .  Denied any chest pain.  Compliant with all of his medications and overall feeling well.   ROS: Denies: Chest pain, shortness of breath, orthopnea, peripheral edema, palpitations, decreased exercise intolerance, fatigue, lightheadedness.   Studies Reviewed: SABRA    EKG Interpretation Date/Time:  Wednesday June 27 2024 13:45:52 EST Ventricular Rate:  65 PR Interval:  174 QRS Duration:  84 QT Interval:  366 QTC Calculation: 380 R Axis:   55  Text Interpretation: Normal sinus rhythm Possible Inferior infarct , age undetermined When compared with ECG of 09-Jun-2024 04:31, Borderline criteria for Inferior infarct are now Present Confirmed by Darryle Currier [phone]) on 06/27/2024 1:49:31 PM    Risk Assessment/Calculations:             Physical Exam:   VS:  BP 124/66   Pulse 65   Ht 5' 11 (1.803 m)   Wt 184 lb (83.5 kg)   SpO2 96%   BMI 25.66 kg/m    Wt Readings from Last 3 Encounters:  06/27/24 184 lb (83.5 kg)  06/08/24 181 lb 1.6 oz (82.1 kg)  05/17/24 187 lb 3.2 oz (84.9 kg)    GEN: Well nourished, well developed in no acute distress NECK: No JVD; No carotid bruits CARDIAC: RRR, no murmurs, rubs, gallops RESPIRATORY:  Clear to auscultation without rales, wheezing or rhonchi  ABDOMEN: Soft, non-tender, non-distended EXTREMITIES:  No edema; No deformity   ASSESSMENT AND PLAN: .    CAD Hyperlipidemia  -05/2024 EF normal with no significant valvular disease. -06/2024 DES  x 2 to the mid LCx. DES x1 of the proximal and mid RCA.   Overall uncomplicated procedure and doing well after cardiac catheterization.  No longer waking up in the middle of the night with chest pain.  Happy with current regiment. Plan is for DAPT therapy with aspirin  and Plavix  for 6 months uninterrupted.  Revisit May 2026 Continue with atorvastatin  80 mg. LDL 7 months ago was 69.  Goal less than 55 he is not fasted today, repeat fasting lipid panel and LFTs within the next week  but will get this arranged at [facility].  Of note he would like to work on dietary changes before medication changes.  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  Will need to discuss with his vascular surgeon and primary cardiologist the timing of this.  Obviously with the stent placement would typically not want to stop DAPT therapy for most procedures may be reasonable to consider holding maybe after a month if urgent but will be up to primary cardiologist.  From a preoperative standpoint he demonstrates good functional capacity able to complete greater than 4 METS.  His RCRI is 1.1%.  Does not need further cardiac workup.   Hypertension Well-controlled 124/66. Continue with lisinopril  40 mg and amlodipine  10 mg.   Type 2 diabetes A1c 6.2%.  I think he is technically prediabetic I do not see an A1c over 6.5%   PAD Follows with vascular surgery.  Most recent CTA demonstrates acute symmetrical bilateral severe ostial stenosis of the common iliac artery.  He had patent three-vessel left lower extremity runoff.  Symmetric bilateral severe ostial stenoses of the common iliac arteries.   AAA Fusiform infrarenal abdominal aortic aneurysm measuring up to 4.6cm. Recommend follow-up CT or MR as appropriate in 12 months.  No family history of aortopathy's or dissection.  No longer smokes.  Carotid bruit 05/2024 bilaterally had 1 at 39% stenosis in the ICA    Dispo: Already has follow-up with Dr. Floretta in January 2026.  He lives in [place] so after that visit would transfer care up there at his preference.  Addendum: Primary cardiologist spoke with vascular surgery, plan is to wait at least 6 months to perform procedure.    Signed, Thom LITTIE Sluder, PA-C

## 2024-06-27 NOTE — Patient Instructions (Signed)
 Medication Instructions:  Your physician recommends that you continue on your current medications as directed. Please refer to the Current Medication list given to you today.  *If you need a refill on your cardiac medications before your next appointment, please call your pharmacy*  Lab Work: 1 week- Fasting Lipids, LFTs If you have labs (blood work) drawn today and your tests are completely normal, you will receive your results only by: MyChart Message (if you have MyChart) OR A paper copy in the mail If you have any lab test that is abnormal or we need to change your treatment, we will call you to review the results.   Follow-Up: Keep upcoming appointments

## 2024-07-02 ENCOUNTER — Other Ambulatory Visit (HOSPITAL_COMMUNITY): Payer: Self-pay

## 2024-07-05 ENCOUNTER — Ambulatory Visit: Payer: Self-pay | Admitting: Cardiology

## 2024-07-05 LAB — HEPATIC FUNCTION PANEL
ALT: 20 IU/L (ref 0–44)
AST: 17 IU/L (ref 0–40)
Albumin: 4.5 g/dL (ref 3.9–4.9)
Alkaline Phosphatase: 101 IU/L (ref 47–123)
Bilirubin Total: 0.6 mg/dL (ref 0.0–1.2)
Bilirubin, Direct: 0.18 mg/dL (ref 0.00–0.40)
Total Protein: 6.7 g/dL (ref 6.0–8.5)

## 2024-07-05 LAB — LIPID PANEL
Chol/HDL Ratio: 4.5 ratio (ref 0.0–5.0)
Cholesterol, Total: 138 mg/dL (ref 100–199)
HDL: 31 mg/dL — ABNORMAL LOW (ref >39)
LDL Chol Calc (NIH): 83 mg/dL (ref 0–99)
Triglycerides: 136 mg/dL (ref 0–149)
VLDL Cholesterol Cal: 24 mg/dL (ref 5–40)

## 2024-07-05 NOTE — Telephone Encounter (Signed)
-----   Message from Thom LITTIE Sluder sent at 07/05/2024  9:30 AM EST ----- LDL is 83, for him goal should be less than 55.  I know before he has wanted to focus on dietary modifications but would have to make significant improvement to get to this goal.  I can offer him  Zetia which likely would help get very close to his goal in conjunction with dietary modifications.  If that is not desired or decline we could also consider PCSK9 inhibitor and referral to our lipid  clinic.  Please let me know what he wants to do.  Hepatic function normal. ----- Message ----- From: Interface, [facility] Lab Results In Sent: 07/05/2024   3:41 AM EST To: Thom LITTIE Sluder, PA-C

## 2024-07-05 NOTE — Telephone Encounter (Signed)
 Attempted patient  vm stated   this person is unavailable try your  call   later

## 2024-07-11 NOTE — Telephone Encounter (Signed)
 Spoke with pt regarding labs. Pt declined starting Zetia. Pt was offered a referral to our Lipid Clinic, but declined. Pt stated he has made dietary changes since his labs, he stop drinking sodas, and would like to wait to discuss other options at his f/u appt scheduled for 1/13.Pt was advised to call our office if he has any further concerns. Pt agreed.

## 2024-07-20 ENCOUNTER — Telehealth: Payer: Self-pay | Admitting: Student in an Organized Health Care Education/Training Program

## 2024-07-20 NOTE — Telephone Encounter (Signed)
 LVMTCB 07/20/24

## 2024-07-20 NOTE — Telephone Encounter (Signed)
 Pt c/o medication issue:  1. Name of Medication:   clopidogrel  (PLAVIX ) 75 MG tablet    2. How are you currently taking this medication (dosage and times per day)? As written   3. Are you having a reaction (difficulty breathing--STAT)? No   4. What is your medication issue? Pt would like a c/b from nurse to discuss medication, side affects and bruising easily

## 2024-07-23 NOTE — Telephone Encounter (Signed)
 Left message to call back.

## 2024-07-23 NOTE — Telephone Encounter (Signed)
 Spoke to patient and he reports that he has really been noticing more bruising than normal. Pt denies bleeding in stool, nose bleeds or coughing up blood. Pt is just expressing concern since bruising seem worse than it has been. Pt did receive stents during last cath and I explained to patient that typically patients do have to be on med post stent placement.

## 2024-07-24 NOTE — Telephone Encounter (Signed)
 Pt contacted and advised. Pt advised to monitor for worsening symptoms or active bleeding.

## 2024-08-13 NOTE — Progress Notes (Incomplete)
 " Cardiology Office Note:  .   Date:  08/13/2024  ID:  Daniel Chung, DOB Jul 03, 1956, MRN [id number] PCP: Tobie Suzzane POUR, MD  [facility] Providers Cardiologist:  Georganna Archer, MD { Chief Complaint: No chief complaint on file.   History of Present Illness: .    Daniel Chung is a 69 y.o. 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  HPI:  Patient is followed by vascular surgery for PAD and a recently discovered left groin pseudoaneurysm.  Vascular surgery plans to do corrective repair and is requesting cardiovascular risk assessment.   The patient states that overall he feels well, but does endorse having some SOB with exertion.  He reports that if he walks up an incline or does certain physical activities around his yard he will feel more SOB.  He can climb multiple flights of steps without limitation and can do most activities without limitation, but he tries not to push himself too hard.  He denies chest pain, SOB at rest, PND, orthopnea, swelling, palpitations, syncope, and presyncope. The patient underwent CT imaging for lung cancer screening in April of this year.  I reviewed the images myself and they demonstrate significant 3V CAC, aortic calcifications and prominent MAC.   The patient quit smoking in 2017 but smoked from the age of 18 until then smoking 1.5 PPD.  He drinks EtOH infrequently.  Endorses THC use.  He has no family history of cardiovascular disease.  The patient states that he noted a lump in his left groin roughly 1 year ago and was ultimately determined that he has a pseudoaneurysm in his left groin.  Currently awaiting surgery by vascular.  Interval History 08/14/24: -  Discussed the use of AI scribe software for clinical note transcription with the patient, who gave verbal consent to  proceed.  History of Present Illness       Studies Reviewed: SABRA    EKG: ***       Cardiac Studies & Procedures   ______________________________________________________________________________________________ CARDIAC CATHETERIZATION  CARDIAC CATHETERIZATION 06/08/2024  Conclusion   Mid Cx to Dist Cx lesion is 99% stenosed.   Mid Cx lesion is 70% stenosed.   Mid RCA lesion is 80% stenosed.   Prox RCA lesion is 80% stenosed.   A drug-eluting stent was successfully placed using a STENT SYNERGY XD 2.50X16.   A drug-eluting stent was successfully placed using a STENT SYNERGY XD [id number].   A drug-eluting stent was successfully placed using a STENT SYNERGY XD 3.0X38.   Post intervention, there is a 0% residual stenosis.   Post intervention, there is a 0% residual stenosis.   Post intervention, there is a 0% residual stenosis.   Post intervention, there is a 0% residual stenosis.  Severe stenosis mid Circumflex Successful PTCA/DES x 2 mid Circumflex Severe stenosis proximal and mid RCA. Virtual FFR 0.71 (Cathworks) Successful PTCA/DES x 1 proximal and mid RCA Normal LVEDP  Recommendations: Will need DAPT with ASA and Plavix  for 6 months before it is interrupted. Continue statin. Same day post PCI discharge.  Findings Coronary Findings Diagnostic  Dominance: Right  Left Anterior Descending Vessel is large.  Left Circumflex Vessel is large. Mid Cx lesion is 70% stenosed. Mid Cx to Dist Cx lesion is 99% stenosed.  Right Coronary Artery Vessel is large. Prox RCA lesion is  80% stenosed. Mid RCA lesion is 80% stenosed.  Intervention  Mid Cx lesion Stent Pre-stent angioplasty was performed using a BALLOON EMERGE MR 2.0X12. A drug-eluting stent was successfully placed using a STENT SYNERGY XD [id number]. Post-stent angioplasty was performed using a BALLOON [place] EMERGE MR [id number]. Post-Intervention Lesion Assessment The intervention was successful. Pre-interventional TIMI flow  is 3. Post-intervention TIMI flow is 3. No complications occurred at this lesion. There is a 0% residual stenosis post intervention.  Mid Cx to Dist Cx lesion Stent CATH LAUNCHER 6FR EBU 3 guide catheter was inserted. Lesion crossed with guidewire using a WIRE RUNTHROUGH .[id number]. Pre-stent angioplasty was performed using a BALLOON EMERGE MR 2.0X12. A drug-eluting stent was successfully placed using a STENT SYNERGY XD 2.50X16. Stent strut is well apposed. Post-stent angioplasty was performed using a BALLOON [place] EMERGE MR [id number]. Post-Intervention Lesion Assessment The intervention was successful. Pre-interventional TIMI flow is 3. Post-intervention TIMI flow is 3. No complications occurred at this lesion. There is a 0% residual stenosis post intervention.  Prox RCA lesion Stent (Also treats lesions: Mid RCA) CATH LAUNCHER 6FR AL.75 guide catheter was inserted. Lesion crossed with guidewire using a WIRE RUNTHROUGH .[id number]. Pre-stent angioplasty was performed using a BALLOON EMERGE MR 2.0X12. A drug-eluting stent was successfully placed using a STENT SYNERGY XD 3.0X38. Post-stent angioplasty was performed using a BALLOON [place] EMERGE MR 3.25X20. Post-Intervention Lesion Assessment The intervention was successful. Pre-interventional TIMI flow is 3. Post-intervention TIMI flow is 3. No complications occurred at this lesion. There is a 0% residual stenosis post intervention.  Mid RCA lesion Stent (Also treats lesions: Prox RCA) See details in Prox RCA lesion. Post-Intervention Lesion Assessment The intervention was successful. Pre-interventional TIMI flow is 3. Post-intervention TIMI flow is 3. No complications occurred at this lesion. There is a 0% residual stenosis post intervention.   STRESS TESTS  MYOCARDIAL PERFUSION IMAGING 05/28/2024  Interpretation Summary   Findings are consistent with inferior infarction. The study is intermediate risk due to medium size defect and TID.   No ST  deviation was noted. Arrhythmias during recovery: frequent PVCs.   LV perfusion is abnormal. There is no evidence of ischemia. There is evidence of infarction. Defect 1: There is a medium defect with moderate reduction in uptake present in the apical to basal inferior location(s) that is fixed. There is abnormal wall motion in the defect area. Consistent with infarction.   Left ventricular function is normal. Nuclear stress EF: 60%. The left ventricular ejection fraction is normal (55-65%). End diastolic cavity size is normal. End systolic cavity size is normal. Evidence of transient ischemic dilation (TID) noted.   CT images were obtained for attenuation correction and were examined for the presence of coronary calcium  when appropriate.   Coronary calcium  was present on the attenuation correction CT images. Severe coronary calcifications were present. Coronary calcifications were present in the left anterior descending artery, left circumflex artery and right coronary artery distribution(s). Severe MAC. Moderate aortic valve calcifications. Severe aortic root atherosclerosis   Prior study not available for comparison.   ECHOCARDIOGRAM  ECHOCARDIOGRAM COMPLETE 05/31/2024  Narrative ECHOCARDIOGRAM REPORT    Patient Name:   Daniel Chung Date of Exam: 05/31/2024 Medical Rec #:  [id number]    Height:       71.0 in Accession #:    [id number]   Weight:       187.2 lb Date of Birth:  Oct 09, 1955    BSA:          2.050 m Patient  Age:    68 years     BP:           121/69 mmHg Patient Gender: M            HR:           85 bpm. Exam Location:  Outpatient  Procedure: 2D Echo, Cardiac Doppler and Color Doppler (Both Spectral and Color Flow Doppler were utilized during procedure).  Indications:    Shortness of breath  History:        Patient has no prior history of Echocardiogram examinations.  Sonographer:    Tinnie Gosling RDCS Referring Phys: [id number] GEORGANNA ARCHER  IMPRESSIONS   1. Left  ventricular ejection fraction, by estimation, is 60 to 65%. The left ventricle has normal function. The left ventricle has no regional wall motion abnormalities. Left ventricular diastolic parameters were normal. 2. Right ventricular systolic function is normal. The right ventricular size is normal. Tricuspid regurgitation signal is inadequate for assessing PA pressure. 3. The mitral valve is normal in structure. No evidence of mitral valve regurgitation. No evidence of mitral stenosis. 4. The aortic valve was not well visualized. Aortic valve regurgitation is not visualized. No aortic stenosis is present. 5. The inferior vena cava is normal in size with greater than 50% respiratory variability, suggesting right atrial pressure of 3 mmHg. 6. Technically difficult study with poor acoustic windows.  FINDINGS Left Ventricle: Left ventricular ejection fraction, by estimation, is 60 to 65%. The left ventricle has normal function. The left ventricle has no regional wall motion abnormalities. The left ventricular internal cavity size was normal in size. There is no left ventricular hypertrophy. Left ventricular diastolic parameters were normal.  Right Ventricle: The right ventricular size is normal. No increase in right ventricular wall thickness. Right ventricular systolic function is normal. Tricuspid regurgitation signal is inadequate for assessing PA pressure.  Left Atrium: Left atrial size was normal in size.  Right Atrium: Right atrial size was normal in size.  Pericardium: There is no evidence of pericardial effusion.  Mitral Valve: The mitral valve is normal in structure. Mild mitral annular calcification. No evidence of mitral valve regurgitation. No evidence of mitral valve stenosis.  Tricuspid Valve: The tricuspid valve is normal in structure. Tricuspid valve regurgitation is not demonstrated.  Aortic Valve: The aortic valve was not well visualized. Aortic valve regurgitation is not  visualized. No aortic stenosis is present.  Pulmonic Valve: The pulmonic valve was normal in structure. Pulmonic valve regurgitation is not visualized.  Aorta: The aortic root is normal in size and structure.  Venous: The inferior vena cava is normal in size with greater than 50% respiratory variability, suggesting right atrial pressure of 3 mmHg.  IAS/Shunts: No atrial level shunt detected by color flow Doppler.   LEFT VENTRICLE PLAX 2D LVIDd:         4.20 cm   Diastology LVIDs:         3.10 cm   LV e' medial:    10.00 cm/s LV PW:         1.00 cm   LV E/e' medial:  9.8 LV IVS:        0.90 cm   LV e' lateral:   12.40 cm/s LVOT diam:     2.10 cm   LV E/e' lateral: 7.9 LV SV:         60 LV SV Index:   29 LVOT Area:     3.46 cm LV IVRT:  103 msec   RIGHT VENTRICLE             IVC RV S prime:     13.70 cm/s  IVC diam: 1.30 cm TAPSE (M-mode): 2.2 cm PULMONARY VEINS Diastolic Velocity: 40.30 cm/s S/D Velocity:       1.40 Systolic Velocity:  56.50 cm/s  LEFT ATRIUM             Index        RIGHT ATRIUM          Index LA diam:        3.00 cm 1.46 cm/m   RA Area:     8.04 cm LA Vol (A2C):   20.1 ml 9.80 ml/m   RA Volume:   12.60 ml 6.15 ml/m LA Vol (A4C):   22.8 ml 11.12 ml/m LA Biplane Vol: 21.7 ml 10.58 ml/m AORTIC VALVE LVOT Vmax:   85.90 cm/s LVOT Vmean:  57.000 cm/s LVOT VTI:    0.172 m  AORTA Ao Root diam: 3.10 cm Ao Asc diam:  3.10 cm  MITRAL VALVE MV Area (PHT): 3.74 cm    SHUNTS MV Decel Time: 203 msec    Systemic VTI:  0.17 m MV E velocity: 98.50 cm/s  Systemic Diam: 2.10 cm MV A velocity: 88.90 cm/s MV E/A ratio:  1.11  Dalton McleanMD Electronically signed by Ezra Kanner Signature Date/Time: 05/31/2024/6:12:49 PM    Final          ______________________________________________________________________________________________      Results   Risk Assessment/Calculations:    {Does this patient have ATRIAL  FIBRILLATION?:[phone]} No BP recorded.  {Refresh Note OR Click here to enter BP  :1}***        Physical Exam:    VS:  There were no vitals taken for this visit. ***    Wt Readings from Last 3 Encounters:  06/27/24 184 lb (83.5 kg)  06/08/24 181 lb 1.6 oz (82.1 kg)  05/17/24 187 lb 3.2 oz (84.9 kg)     GEN: Well nourished, well developed, in no acute distress NECK: No JVD; No carotid bruits CARDIAC: ***RRR, no murmurs, rubs, gallops RESPIRATORY:  Clear to auscultation without rales, wheezing or rhonchi  ABDOMEN: Soft, non-tender, non-distended, normal bowel sounds EXTREMITIES:  Warm and well perfused, no edema; No deformity, 2+ radial pulses PSYCH: Normal mood and affect   ASSESSMENT AND PLAN: .    Assessment and Plan  #CAD s/p PCI to Lcx and RCA   #HTN   #HLD   #Femoral Aneurysm  Assessment & Plan        {Are you ordering a CV Procedure (e.g. stress test, cath, DCCV, TEE, etc)?   Press F2        :[id number]}    Follow up: ***  This note was written with the assistance of a dictation microphone or AI dictation software. Please excuse any typos or grammatical errors.   Signed, Georganna Archer, MD  08/13/2024 11:35 PM    [facility] "

## 2024-08-14 ENCOUNTER — Encounter: Payer: Self-pay | Admitting: Student in an Organized Health Care Education/Training Program

## 2024-08-14 ENCOUNTER — Ambulatory Visit
Attending: Student in an Organized Health Care Education/Training Program | Admitting: Student in an Organized Health Care Education/Training Program

## 2024-08-14 VITALS — BP 130/86 | HR 82 | Ht 71.0 in | Wt 184.0 lb

## 2024-08-14 DIAGNOSIS — E782 Mixed hyperlipidemia: Secondary | ICD-10-CM | POA: Diagnosis not present

## 2024-08-14 DIAGNOSIS — I2510 Atherosclerotic heart disease of native coronary artery without angina pectoris: Secondary | ICD-10-CM | POA: Diagnosis not present

## 2024-08-14 DIAGNOSIS — I10 Essential (primary) hypertension: Secondary | ICD-10-CM | POA: Insufficient documentation

## 2024-08-14 MED ORDER — EZETIMIBE 10 MG PO TABS: 10.0000 mg | ORAL_TABLET | Freq: Every day | ORAL | 3 refills | Status: AC

## 2024-08-14 NOTE — Patient Instructions (Signed)
 Medication Instructions:  START Zetia  10 mg daily   *If you need a refill on your cardiac medications before your next appointment, please call your pharmacy*  Monitor blood pressures for 1 week twice daily and contact office to advise results:  Blood Pressure Record Sheet To take your blood pressure, you will need a blood pressure machine. You can buy a blood pressure machine (blood pressure monitor) at your clinic, drug store, or online. When choosing one, consider: An automatic monitor that has an arm cuff. A cuff that wraps snugly around your upper arm. You should be able to fit only one finger between your arm and the cuff. A device that stores blood pressure reading results. Do not choose a monitor that measures your blood pressure from your wrist or finger. Follow your health care provider's instructions for how to take your blood pressure. To use this form: Take your blood pressure medications every day These measurements should be taken when you have been at rest for at least 10-15 min Take at least 2 readings with each blood pressure check. This makes sure the results are correct. Wait 1-2 minutes between measurements. Write down the results in the spaces on this form. Keep in mind it should always be recorded systolic over diastolic. Both numbers are important.  Repeat this every day for 2-3 weeks, or as told by your health care provider.  Make a follow-up appointment with your health care provider to discuss the results.  Blood Pressure Log Date Medications taken? (Y/N) Blood Pressure Time of Day                                                                                                         Follow-Up: At [facility], you and your health needs are our priority.  As part of our continuing mission to provide you with exceptional heart care, our providers are all part of one team.  This team includes your primary Cardiologist (physician) and  Advanced Practice Providers or APPs (Physician Assistants and Nurse Practitioners) who all work together to provide you with the care you need, when you need it.  Your next appointment:   11/2024- 12/2024   Provider:   Georganna Archer, MD    We recommend signing up for the patient portal called MyChart.  Sign up information is provided on this After Visit Summary.  MyChart is used to connect with patients for Virtual Visits (Telemedicine).  Patients are able to view lab/test results, encounter notes, upcoming appointments, etc.  Non-urgent messages can be sent to your provider as well.   To learn more about what you can do with MyChart, go to [email or web address].

## 2024-08-17 ENCOUNTER — Telehealth: Payer: Self-pay | Admitting: Student in an Organized Health Care Education/Training Program

## 2024-08-17 NOTE — Telephone Encounter (Signed)
 Pt c/o medication issue:  1. Name of Medication:   aspirin  81 MG EC tablet    clopidogrel  (PLAVIX ) 75 MG tablet     2. How are you currently taking this medication (dosage and times per day)? As written  3. Are you having a reaction (difficulty breathing--STAT)? no  4. What is your medication issue? Pt states he may have to get a tooth pulled in the near future and requested a call to discuss what he should do regarding medication. Did inform him his dentist office would need to contact us . Please advise.

## 2024-08-17 NOTE — Telephone Encounter (Signed)
 Spoke with patient on the phone. States he has a tooth that needs to be pulled and he keeps putting it off. Let him know that when he has his appt with the Dentist if they require a clearance they will need to fax to us . Will reach out to Dr Floretta regarding a simple tooth pulling and holding these medications and whether or not a clearance is needed.

## 2024-08-17 NOTE — Telephone Encounter (Signed)
 Pt contacted and advised. Pt agrees with plan of care and will discuss with dentist.

## 2024-08-23 ENCOUNTER — Telehealth: Payer: Self-pay | Admitting: Student in an Organized Health Care Education/Training Program

## 2024-08-23 NOTE — Telephone Encounter (Signed)
 Pt c/o medication issue:  1. Name of Medication: ezetimibe  (ZETIA ) 10 MG tablet   2. How are you currently taking this medication (dosage and times per day)? As written   3. Are you having a reaction (difficulty breathing--STAT)? no  4. What is your medication issue? Pt states legs below the knees are itchy  Please advise

## 2024-08-23 NOTE — Telephone Encounter (Signed)
 Patient reports for the past 4-5 days he has had itching below the knees to his ankles. He states it feels similar to when he gets bitten by mosquitoes. Denies any rash/hives or redness.  He states he applies lotion to the skin and it helps some, but does not completely resolve the itching.  He attributes this to the zetia , he started taking it on 1/13 or 1/14.  The itching comes and goes. It sometimes is better when he has his legs elevated and more bothersome when they are down. It is sometimes improved when walking.  Will forward to pharmacy team and Dr. Floretta to review for further advisement.  Patient requested recent lab results be mailed to him, confirmed mailing address on chart and printed copies have been placed in outgoing mail.

## 2024-08-24 NOTE — Telephone Encounter (Signed)
 Pt contacted and advised. Pt agrees with plan of care.

## 2024-09-04 ENCOUNTER — Other Ambulatory Visit: Payer: Self-pay | Admitting: Internal Medicine

## 2024-09-04 DIAGNOSIS — E782 Mixed hyperlipidemia: Secondary | ICD-10-CM

## 2024-09-04 DIAGNOSIS — I10 Essential (primary) hypertension: Secondary | ICD-10-CM

## 2024-09-05 ENCOUNTER — Telehealth: Payer: Self-pay | Admitting: Student in an Organized Health Care Education/Training Program

## 2024-09-05 NOTE — Telephone Encounter (Signed)
 Pt came in and stated that he would like someone to call him back the plan of care for ezetimibe  (ZETIA ) 10 MG tablet . Pt call back number is [phone]

## 2024-09-05 NOTE — Telephone Encounter (Signed)
 Pt dropped off BP recordings. And would like a call from the nurse regarding his plan of care for medication ezetimibe  (ZETIA ) 10 MG tablet .   Location: Dr. Floretta mailbox.

## 2024-09-21 ENCOUNTER — Ambulatory Visit: Admitting: Internal Medicine

## 2024-12-20 ENCOUNTER — Ambulatory Visit: Admitting: Student in an Organized Health Care Education/Training Program

## 2025-03-06 ENCOUNTER — Ambulatory Visit
